# Patient Record
Sex: Male | Born: 1960
Health system: Southern US, Community
[De-identification: ages and names within clinical notes are randomized; demographics above are authoritative.]

## PROBLEM LIST (undated history)

## (undated) DIAGNOSIS — M1990 Unspecified osteoarthritis, unspecified site: Secondary | ICD-10-CM

## (undated) DIAGNOSIS — F32A Depression, unspecified: Secondary | ICD-10-CM

## (undated) DIAGNOSIS — M7550 Bursitis of unspecified shoulder: Secondary | ICD-10-CM

## (undated) DIAGNOSIS — M810 Age-related osteoporosis without current pathological fracture: Secondary | ICD-10-CM

## (undated) DIAGNOSIS — M779 Enthesopathy, unspecified: Secondary | ICD-10-CM

## (undated) DIAGNOSIS — C801 Malignant (primary) neoplasm, unspecified: Secondary | ICD-10-CM

## (undated) DIAGNOSIS — T7840XA Allergy, unspecified, initial encounter: Secondary | ICD-10-CM

## (undated) DIAGNOSIS — J45909 Unspecified asthma, uncomplicated: Secondary | ICD-10-CM

## (undated) DIAGNOSIS — Z9889 Other specified postprocedural states: Secondary | ICD-10-CM

## (undated) DIAGNOSIS — R112 Nausea with vomiting, unspecified: Secondary | ICD-10-CM

## (undated) DIAGNOSIS — I10 Essential (primary) hypertension: Secondary | ICD-10-CM

## (undated) DIAGNOSIS — M21969 Unspecified acquired deformity of unspecified lower leg: Secondary | ICD-10-CM

## (undated) DIAGNOSIS — F419 Anxiety disorder, unspecified: Secondary | ICD-10-CM

## (undated) DIAGNOSIS — R7303 Prediabetes: Secondary | ICD-10-CM

## (undated) DIAGNOSIS — E785 Hyperlipidemia, unspecified: Secondary | ICD-10-CM

## (undated) HISTORY — PX: ROTATOR CUFF REPAIR: SHX139

## (undated) HISTORY — DX: Bursitis of unspecified shoulder: M75.50

## (undated) HISTORY — DX: Age-related osteoporosis without current pathological fracture: M81.0

## (undated) HISTORY — DX: Hyperlipidemia, unspecified: E78.5

## (undated) HISTORY — DX: Allergy, unspecified, initial encounter: T78.40XA

## (undated) HISTORY — DX: Depression, unspecified: F32.A

## (undated) HISTORY — DX: Unspecified acquired deformity of unspecified lower leg: M21.969

## (undated) HISTORY — DX: Enthesopathy, unspecified: M77.9

## (undated) HISTORY — PX: WRIST SURGERY: SHX841

## (undated) HISTORY — DX: Essential (primary) hypertension: I10

## (undated) HISTORY — DX: Anxiety disorder, unspecified: F41.9

## (undated) HISTORY — PX: SHOULDER FUSION SURGERY: SHX775

## (undated) HISTORY — DX: Unspecified osteoarthritis, unspecified site: M19.90

---

## 1999-04-09 HISTORY — PX: FOOT SURGERY: SHX648

## 2003-03-30 ENCOUNTER — Other Ambulatory Visit: Payer: Self-pay

## 2007-04-09 HISTORY — PX: SHOULDER FUSION SURGERY: SHX775

## 2007-04-09 HISTORY — PX: ROTATOR CUFF REPAIR: SHX139

## 2008-02-27 ENCOUNTER — Ambulatory Visit: Payer: Self-pay | Admitting: Orthopedic Surgery

## 2008-03-28 ENCOUNTER — Ambulatory Visit: Payer: Self-pay | Admitting: Orthopedic Surgery

## 2008-04-04 ENCOUNTER — Ambulatory Visit: Payer: Self-pay | Admitting: Orthopedic Surgery

## 2012-02-13 DIAGNOSIS — I10 Essential (primary) hypertension: Secondary | ICD-10-CM | POA: Insufficient documentation

## 2012-02-13 DIAGNOSIS — J301 Allergic rhinitis due to pollen: Secondary | ICD-10-CM | POA: Insufficient documentation

## 2012-02-13 DIAGNOSIS — G8929 Other chronic pain: Secondary | ICD-10-CM | POA: Insufficient documentation

## 2012-02-13 DIAGNOSIS — E785 Hyperlipidemia, unspecified: Secondary | ICD-10-CM | POA: Insufficient documentation

## 2012-02-14 DIAGNOSIS — F1110 Opioid abuse, uncomplicated: Secondary | ICD-10-CM | POA: Insufficient documentation

## 2012-08-11 DIAGNOSIS — G4750 Parasomnia, unspecified: Secondary | ICD-10-CM | POA: Insufficient documentation

## 2012-08-11 DIAGNOSIS — E1169 Type 2 diabetes mellitus with other specified complication: Secondary | ICD-10-CM | POA: Insufficient documentation

## 2012-08-11 DIAGNOSIS — R7303 Prediabetes: Secondary | ICD-10-CM | POA: Insufficient documentation

## 2015-05-30 ENCOUNTER — Other Ambulatory Visit: Payer: Self-pay | Admitting: Family Medicine

## 2015-05-30 ENCOUNTER — Encounter: Payer: Self-pay | Admitting: Family Medicine

## 2015-05-30 ENCOUNTER — Ambulatory Visit (INDEPENDENT_AMBULATORY_CARE_PROVIDER_SITE_OTHER): Payer: Self-pay | Admitting: Family Medicine

## 2015-05-30 VITALS — BP 157/104 | HR 106 | Temp 98.4°F | Resp 16 | Ht 68.0 in | Wt 205.8 lb

## 2015-05-30 DIAGNOSIS — Z205 Contact with and (suspected) exposure to viral hepatitis: Secondary | ICD-10-CM

## 2015-05-30 DIAGNOSIS — F419 Anxiety disorder, unspecified: Secondary | ICD-10-CM

## 2015-05-30 DIAGNOSIS — E785 Hyperlipidemia, unspecified: Secondary | ICD-10-CM

## 2015-05-30 DIAGNOSIS — J0100 Acute maxillary sinusitis, unspecified: Secondary | ICD-10-CM

## 2015-05-30 DIAGNOSIS — I10 Essential (primary) hypertension: Secondary | ICD-10-CM

## 2015-05-30 MED ORDER — DM-GUAIFENESIN ER 30-600 MG PO TB12: 1.0000 | ORAL_TABLET | Freq: Two times a day (BID) | ORAL | Status: DC

## 2015-05-30 MED ORDER — FLUTICASONE PROPIONATE 50 MCG/ACT NA SUSP: 2.0000 | Freq: Every day | NASAL | Status: DC

## 2015-05-30 MED ORDER — PRAVASTATIN SODIUM 40 MG PO TABS: 40.0000 mg | ORAL_TABLET | Freq: Every day | ORAL | Status: DC

## 2015-05-30 MED ORDER — LISINOPRIL-HYDROCHLOROTHIAZIDE 20-12.5 MG PO TABS: 1.0000 | ORAL_TABLET | Freq: Every day | ORAL | Status: DC

## 2015-05-30 MED ORDER — OXYMETAZOLINE HCL 0.05 % NA SOLN: 2.0000 | Freq: Two times a day (BID) | NASAL | Status: DC

## 2015-05-30 MED ORDER — AMOXICILLIN-POT CLAVULANATE 875-125 MG PO TABS: 1.0000 | ORAL_TABLET | Freq: Two times a day (BID) | ORAL | Status: DC

## 2015-05-30 NOTE — Patient Instructions (Signed)
You can use supportive care at home to help with your symptoms. I have sent Mucinex DM to your pharmacy to help break up the congestion and soothe your cough. You can takes this twice daily.  I have also sent tesslon perles to your pharmacy to help with the cough- you can take these 3 times daily as needed. Honey is a natural cough suppressant- so add it to your tea in the morning.  If you have a humidifer, set that up in your bedroom at night.   Take augmentin twice daily for 7 days for sinus infection. Please avoid OTC decongestants as they can drive your blood pressure up.   Your goal blood pressure is 140/90. Work on low salt/sodium diet - goal <1.5gm (1,500mg ) per day. Eat a diet high in fruits/vegetables and whole grains.  Look into mediterranean and DASH diet. Goal activity is 168min/wk of moderate intensity exercise.  This can be split into 30 minute chunks.  If you are not at this level, you can start with smaller 10-15 min increments and slowly build up activity. Look at [email or web address] for more resources  Please seek immediate medical attention at ER or Urgent Care if you develop: Chest pain, pressure or tightness. Shortness of breath accompanied by nausea or diaphoresis Visual changes Numbness or tingling on one side of the body Facial droop Altered mental status Or any concerning symptoms.

## 2015-05-30 NOTE — Progress Notes (Signed)
Subjective:    Patient ID: Jorge Jordan, male    DOB: 12-17-1960, 55 y.o.   MRN: [id number]  HPI: Jorge Jordan is a 55 y.o. male presenting on 05/30/2015 for Establish Care   HPI  Pt presents to establish care today. Previous care provider was [facility].  It has been 1.5 Time; days - years:[id number]} since His last PCP visit. Records from previous provider will be requested and reviewed. Current medical problems include:  Pt is reporting sinus symptoms and would like a sick visit today- full health history will be reviewed at later visit.   Pt reports sinus symptoms started on Saturday night- sinus HA, pressure, and congestion. Pt wife had similar illness with vomiting. Pt reports emesis x 2 today. No nausea. Lots of sinus drainage. No fevers at home. Some chest congestion and tightness. No trouble breathing. Cough is non-productive of sputum Home treatment: Nasal decongestants and tylenol.   Hypertension: BP is elevated. Diagnosed 10 years ago. Lisinopril 20mg  for several years- doesn't feel the lisinopril is working- checks BP at home- 150/100. Has been out since last Wednesday.    Health maintenance:  Pt is requesting to be screened for Hep C today.    Past Medical History  Diagnosis Date  . Arthritis   . Shoulder bursitis     both   . Tendinitis     elbow  . Foot deformity     foot arthrodesis L foot  . Hypertension   . Hyperlipidemia   . Anxiety   . Allergy    Social History   Social History  . Marital Status: Married    Spouse Name: N/A  . Number of Children: N/A  . Years of Education: N/A   Occupational History  . Not on file.   Social History Main Topics  . Smoking status: Never Smoker   . Smokeless tobacco: Not on file  . Alcohol Use: Yes  . Drug Use: No  . Sexual Activity: Not on file   Other Topics Concern  . Not on file   Social History Narrative  . No narrative on file   Family History  Problem Relation Age of Onset  .  Hyperlipidemia Mother   . Hypertension Mother   . Diabetes Mother   . Depression Mother   . Hypertension Father   . Hyperlipidemia Father   . Cancer Father     skin cancer  . Hypertension Brother   . Diabetes Maternal Grandmother    No current outpatient prescriptions on file prior to visit.   No current facility-administered medications on file prior to visit.    Review of Systems  Constitutional: Positive for chills. Negative for fever.  HENT: Positive for congestion, postnasal drip, rhinorrhea and sinus pressure. Negative for sore throat and trouble swallowing.   Respiratory: Positive for cough and chest tightness. Negative for shortness of breath and wheezing.   Cardiovascular: Negative for chest pain, palpitations and leg swelling.  Gastrointestinal: Positive for nausea and vomiting. Negative for abdominal pain.  Endocrine: Negative.   Genitourinary: Negative.  Negative for dysuria, urgency, discharge, penile pain and testicular pain.  Musculoskeletal: Negative for back pain, joint swelling and arthralgias.  Skin: Negative.   Neurological: Negative for dizziness, weakness, numbness and headaches.  Psychiatric/Behavioral: Negative for sleep disturbance and dysphoric mood.   Per HPI unless specifically indicated above     Objective:    BP 157/104 mmHg  Pulse 106  Temp(Src) 98.4 F (36.9 C) (Oral)  Resp 16  Ht 5\' 8"  (1.727 m)  Wt 205 lb 12.8 oz (93.35 kg)  BMI 31.30 kg/m2  Wt Readings from Last 3 Encounters:  05/30/15 205 lb 12.8 oz (93.35 kg)    Physical Exam  Constitutional: He is oriented to person, place, and time. He appears well-developed and well-nourished. No distress.  HENT:  Head: Normocephalic and atraumatic.  Right Ear: Hearing and tympanic membrane normal.  Left Ear: Hearing normal. Tympanic membrane is scarred.  Nose: Mucosal edema and rhinorrhea present. Right sinus exhibits frontal sinus tenderness. Left sinus exhibits frontal sinus tenderness.    Mouth/Throat: Posterior oropharyngeal erythema present.  Neck: Neck supple. No thyromegaly present.  Cardiovascular: Normal rate, regular rhythm and normal heart sounds.  Exam reveals no gallop and no friction rub.   No murmur heard. Pulmonary/Chest: Effort normal and breath sounds normal. He has no wheezes.  Abdominal: Soft. Bowel sounds are normal. He exhibits no distension. There is no tenderness. There is no rebound.  Musculoskeletal: Normal range of motion. He exhibits no edema or tenderness.  Neurological: He is alert and oriented to person, place, and time. He has normal reflexes.  Skin: Skin is warm and dry. No rash noted. No erythema.  Psychiatric: He has a normal mood and affect. His behavior is normal. Thought content normal.   No results found for this or any previous visit.    Assessment & Plan:   Problem List Items Addressed This Visit      Cardiovascular and Mediastinum   BP (high blood pressure)    BP is elevated. Cautioned pt to avoid decongestants. Add HCTZ to BP regimen. Check CMET. Recheck in 3 weeks.       Relevant Medications   lisinopril-hydrochlorothiazide (PRINZIDE,ZESTORETIC) 20-12.5 MG tablet   pravastatin (PRAVACHOL) 40 MG tablet   Other Relevant Orders   Comprehensive Metabolic Panel (CMET)    Other Visit Diagnoses    Acute maxillary sinusitis, recurrence not specified    -  Primary    Treat for sinus infection due to exquiste tenderness. Augmentin BID. Encouraged supportive care at home. Return if not improving.     Relevant Medications    amoxicillin-clavulanate (AUGMENTIN) 875-125 MG tablet    dextromethorphan-guaiFENesin (MUCINEX DM) 30-600 MG 12hr tablet    oxymetazoline (AFRIN NASAL SPRAY) 0.05 % nasal spray    Hyperlipidemia        Refill pravastatin, check Lipids.     Relevant Medications    lisinopril-hydrochlorothiazide (PRINZIDE,ZESTORETIC) 20-12.5 MG tablet    pravastatin (PRAVACHOL) 40 MG tablet    Other Relevant Orders    Lipid  Profile    Anxiety        Check TSH and vitamin D for anxiety while labwork is being done.     Relevant Orders    TSH    VITAMIN D 25 Hydroxy (Vit-D Deficiency, Fractures)    Exposure to hepatitis C        Relevant Orders    Hepatitis c antibody (reflex)       Meds ordered this encounter  Medications  . DISCONTD: lisinopril (PRINIVIL,ZESTRIL) 20 MG tablet    Sig:   . DISCONTD: pravastatin (PRAVACHOL) 40 MG tablet    Sig:   . lisinopril-hydrochlorothiazide (PRINZIDE,ZESTORETIC) 20-12.5 MG tablet    Sig: Take 1 tablet by mouth daily.    Dispense:  30 tablet    Refill:  11    Order Specific Question:  Supervising Provider    Answer:  Arlis Porta [phone]  .  amoxicillin-clavulanate (AUGMENTIN) 875-125 MG tablet    Sig: Take 1 tablet by mouth 2 (two) times daily.    Dispense:  14 tablet    Refill:  0    Order Specific Question:  Supervising Provider    Answer:  Arlis Porta [phone]  . dextromethorphan-guaiFENesin (MUCINEX DM) 30-600 MG 12hr tablet    Sig: Take 1 tablet by mouth 2 (two) times daily.    Dispense:  20 tablet    Refill:  0    Order Specific Question:  Supervising Provider    Answer:  Arlis Porta [phone]  . oxymetazoline (AFRIN NASAL SPRAY) 0.05 % nasal spray    Sig: Place 2 sprays into both nostrils 2 (two) times daily. For 3 days and 3 days only.    Dispense:  30 mL    Refill:  0    Order Specific Question:  Supervising Provider    Answer:  Arlis Porta [phone]  . pravastatin (PRAVACHOL) 40 MG tablet    Sig: Take 1 tablet (40 mg total) by mouth daily.    Dispense:  30 tablet    Refill:  11    Order Specific Question:  Supervising Provider    Answer:  Arlis Porta [phone]  . DISCONTD: fluticasone (FLONASE) 50 MCG/ACT nasal spray    Sig: Place 2 sprays into both nostrils daily.    Dispense:  16 g    Refill:  11    Order Specific Question:  Supervising Provider    Answer:  Arlis Porta [phone]      Follow  up plan: Return in about 3 weeks (around 06/20/2015) for BP check. Marland Kitchen

## 2015-05-30 NOTE — Assessment & Plan Note (Signed)
BP is elevated. Cautioned pt to avoid decongestants. Add HCTZ to BP regimen. Check CMET. Recheck in 3 weeks.

## 2015-06-08 LAB — COMPREHENSIVE METABOLIC PANEL
ALT: 82 IU/L — ABNORMAL HIGH (ref 0–44)
AST: 55 IU/L — ABNORMAL HIGH (ref 0–40)
Albumin/Globulin Ratio: 1.9 (ref 1.1–2.5)
Albumin: 4.7 g/dL (ref 3.5–5.5)
Alkaline Phosphatase: 111 IU/L (ref 39–117)
BUN/Creatinine Ratio: 11 (ref 9–20)
BUN: 13 mg/dL (ref 6–24)
Bilirubin Total: 0.6 mg/dL (ref 0.0–1.2)
CO2: 23 mmol/L (ref 18–29)
Calcium: 9.8 mg/dL (ref 8.7–10.2)
Chloride: 98 mmol/L (ref 96–106)
Creatinine, Ser: 1.17 mg/dL (ref 0.76–1.27)
GFR calc Af Amer: 81 mL/min/{1.73_m2} (ref >59)
GFR calc non Af Amer: 70 mL/min/{1.73_m2} (ref >59)
Globulin, Total: 2.5 g/dL (ref 1.5–4.5)
Glucose: 125 mg/dL — ABNORMAL HIGH (ref 65–99)
Potassium: 4.5 mmol/L (ref 3.5–5.2)
Sodium: 140 mmol/L (ref 134–144)
Total Protein: 7.2 g/dL (ref 6.0–8.5)

## 2015-06-08 LAB — LIPID PANEL
Chol/HDL Ratio: 4.6 ratio units (ref 0.0–5.0)
Cholesterol, Total: 210 mg/dL — ABNORMAL HIGH (ref 100–199)
HDL: 46 mg/dL (ref >39)
LDL Calculated: 109 mg/dL — ABNORMAL HIGH (ref 0–99)
Triglycerides: 277 mg/dL — ABNORMAL HIGH (ref 0–149)
VLDL Cholesterol Cal: 55 mg/dL — ABNORMAL HIGH (ref 5–40)

## 2015-06-08 LAB — HCV COMMENT:

## 2015-06-08 LAB — TSH: TSH: 2.44 u[IU]/mL (ref 0.450–4.500)

## 2015-06-08 LAB — VITAMIN D 25 HYDROXY (VIT D DEFICIENCY, FRACTURES): Vit D, 25-Hydroxy: 14.7 ng/mL — ABNORMAL LOW (ref 30.0–100.0)

## 2015-06-08 LAB — HEPATITIS C ANTIBODY (REFLEX): HCV Ab: <0.1 s/co ratio (ref 0.0–0.9)

## 2015-06-09 ENCOUNTER — Other Ambulatory Visit: Payer: Self-pay | Admitting: Family Medicine

## 2015-06-09 DIAGNOSIS — E559 Vitamin D deficiency, unspecified: Secondary | ICD-10-CM

## 2015-06-09 MED ORDER — VITAMIN D (ERGOCALCIFEROL) 1.25 MG (50000 UNIT) PO CAPS: 50000.0000 [IU] | ORAL_CAPSULE | ORAL | Status: DC

## 2015-06-20 ENCOUNTER — Other Ambulatory Visit: Payer: Self-pay | Admitting: Family Medicine

## 2015-06-20 ENCOUNTER — Ambulatory Visit (INDEPENDENT_AMBULATORY_CARE_PROVIDER_SITE_OTHER): Payer: Self-pay | Admitting: Family Medicine

## 2015-06-20 VITALS — BP 147/92 | HR 97 | Temp 98.7°F | Resp 16 | Ht 68.0 in | Wt 206.0 lb

## 2015-06-20 DIAGNOSIS — I10 Essential (primary) hypertension: Secondary | ICD-10-CM

## 2015-06-20 DIAGNOSIS — R740 Nonspecific elevation of levels of transaminase and lactic acid dehydrogenase [LDH]: Secondary | ICD-10-CM

## 2015-06-20 DIAGNOSIS — F1010 Alcohol abuse, uncomplicated: Secondary | ICD-10-CM

## 2015-06-20 DIAGNOSIS — E7800 Pure hypercholesterolemia, unspecified: Secondary | ICD-10-CM

## 2015-06-20 DIAGNOSIS — Z125 Encounter for screening for malignant neoplasm of prostate: Secondary | ICD-10-CM

## 2015-06-20 DIAGNOSIS — F419 Anxiety disorder, unspecified: Secondary | ICD-10-CM

## 2015-06-20 DIAGNOSIS — R002 Palpitations: Secondary | ICD-10-CM

## 2015-06-20 DIAGNOSIS — R7401 Elevation of levels of liver transaminase levels: Secondary | ICD-10-CM

## 2015-06-20 DIAGNOSIS — F1011 Alcohol abuse, in remission: Secondary | ICD-10-CM | POA: Insufficient documentation

## 2015-06-20 MED ORDER — PAROXETINE HCL 10 MG PO TABS: 10.0000 mg | ORAL_TABLET | Freq: Every day | ORAL | Status: DC

## 2015-06-20 MED ORDER — LISINOPRIL-HYDROCHLOROTHIAZIDE 20-25 MG PO TABS: 1.0000 | ORAL_TABLET | Freq: Every day | ORAL | Status: DC

## 2015-06-20 NOTE — Assessment & Plan Note (Signed)
Hold statin at this time due to elevated liver enzymes after restart.

## 2015-06-20 NOTE — Patient Instructions (Addendum)
I recommend reducing the alcohol you drink each day. It had negatively impacted your liver. I would like you to recheck your liver enzymes in 2-3 weeks after reducing alcohol.  Anxiety: Let's try paxil to see if it helps your symptoms. Take 1/2 tablet for the first 4 days. Then resume full dose. Recommend taking it at night to help with side effects. Do not stop medication abruptly. Please call if you want to stop medication.   Blood pressure: Let's increase your Lisinopril HCTZ to 20-25 mg to help your blood pressure. We will check back in 1 month.  Your goal blood pressure is 140/90. Work on low salt/sodium diet - goal <1.5gm (1,500mg ) per day. Eat a diet high in fruits/vegetables and whole grains.  Look into mediterranean and DASH diet. Goal activity is 139min/wk of moderate intensity exercise.  This can be split into 30 minute chunks.  If you are not at this level, you can start with smaller 10-15 min increments and slowly build up activity. Look at [place].org for more resources

## 2015-06-20 NOTE — Assessment & Plan Note (Signed)
Increased lisinopril HCTZ to 20-25mg  once daily. Check BP at home. Encouraged cessation of alcohol use. Recheck BMET next visit.

## 2015-06-20 NOTE — Assessment & Plan Note (Addendum)
Pt is requesting clonazepam BID for his symptoms. Have discussed risks and benefit of long-term benzodiazapine use. Given current alcohol use- I am not comfortable giving him a clonazepam. Discussed having him see psychiatry to discuss his symptoms and for management of anxiety.   Pt has agreed to try Paxil for anxiety. Start 10mg  daily. Risks benefits reviewed, side effects reviewed. Recheck 1 mos or with psychiatry.

## 2015-06-20 NOTE — Progress Notes (Signed)
Subjective:    Patient ID: Jorge Jordan, male    DOB: 1961-01-18, 55 y.o.   MRN: [id number]  HPI: Jorge Jordan is a 55 y.o. male presenting on 06/20/2015 for Hypertension   HPI  Pt presents for blood pressure follow-up. Medications changed to Lisinopril-HCTZ for blood pressure control.  CHecks BP at home- avg 130-150/80-90. No chest pain or visual changes. No dizziness. Joined [employer] 1 month ago. Walks 4 miles/hour.  Hand numbness- has issues with carpal tunnel for main years. Gets paresthesia in the finger with hard grips.  Liver enzyme elevations- found on labwork. Had only taken statin for a few days. Is taking lots of tylenol and aleve for pain. Pt reports he is drinking shots (5) of vodka per day to help with anxiety.  Anxiety: Pt reports that his previous doctor had taken him off clonazepam in December of 2015. Was placed on Celexa- he did not like it. Made him feel bad. He stopped taking it. Previous medications include lexapro.  Has been self medicating with alcohol. Wakes every morning with panic symptoms. He reports with rapid HR every morning. Has experience rapid heart beat since 2012. Lasts about (20 minutes). Feels regular and fast. Had always attributed to panic symptoms.  No previous cardiac work-up.   Past Medical History  Diagnosis Date  . Arthritis   . Shoulder bursitis     both   . Tendinitis     elbow  . Foot deformity     foot arthrodesis L foot  . Hypertension   . Hyperlipidemia   . Anxiety   . Allergy     Current Outpatient Prescriptions on File Prior to Visit  Medication Sig  . oxymetazoline (AFRIN NASAL SPRAY) 0.05 % nasal spray Place 2 sprays into both nostrils 2 (two) times daily. For 3 days and 3 days only.  . pravastatin (PRAVACHOL) 40 MG tablet Take 1 tablet (40 mg total) by mouth daily.  . Vitamin D, Ergocalciferol, (DRISDOL) 50000 units CAPS capsule Take 1 capsule (50,000 Units total) by mouth every 7 (seven) days.   No current  facility-administered medications on file prior to visit.    Review of Systems  Constitutional: Negative for fever and chills.  HENT: Negative.   Respiratory: Negative for chest tightness, shortness of breath and wheezing.   Cardiovascular: Negative for chest pain, palpitations and leg swelling.  Gastrointestinal: Negative for nausea, vomiting and abdominal pain.  Endocrine: Negative.   Genitourinary: Negative for dysuria, urgency, discharge, penile pain and testicular pain.  Musculoskeletal: Positive for arthralgias (Shoulder pain. Needs rotator cuff repaired.). Negative for back pain and joint swelling.  Skin: Negative.   Neurological: Negative for dizziness, weakness, numbness and headaches.  Psychiatric/Behavioral: Negative for sleep disturbance and dysphoric mood. The patient is nervous/anxious.    Per HPI unless specifically indicated above     Objective:    BP 147/92 mmHg  Pulse 97  Temp(Src) 98.7 F (37.1 C) (Oral)  Resp 16  Ht 5\' 8"  (1.727 m)  Wt 206 lb (93.441 kg)  BMI 31.33 kg/m2  Wt Readings from Last 3 Encounters:  06/20/15 206 lb (93.441 kg)  05/30/15 205 lb 12.8 oz (93.35 kg)    Physical Exam  Constitutional: He is oriented to person, place, and time. He appears well-developed and well-nourished. No distress.  HENT:  Head: Normocephalic and atraumatic.  Neck: Neck supple. No thyromegaly present.  Cardiovascular: Normal rate, regular rhythm and normal heart sounds.  Exam reveals no gallop and  no friction rub.   No murmur heard. Pulmonary/Chest: Effort normal and breath sounds normal. He has no wheezes.  Abdominal: Soft. Bowel sounds are normal. He exhibits no distension. There is no tenderness. There is no rebound.  Musculoskeletal: He exhibits no edema.       Right shoulder: He exhibits decreased range of motion (due to pain. + painful arc) and tenderness. He exhibits no bony tenderness, no swelling and no effusion.  Neurological: He is alert and oriented to  person, place, and time. He has normal reflexes.  Skin: Skin is warm and dry. No rash noted. No erythema.  Psychiatric: His speech is normal and behavior is normal. Judgment and thought content normal. His mood appears anxious. Cognition and memory are normal.   Results for orders placed or performed in visit on 05/30/15  Comprehensive Metabolic Panel (CMET)  Result Value Ref Range   Glucose 125 (H) 65 - 99 mg/dL   BUN 13 6 - 24 mg/dL   Creatinine, Ser 1.17 0.76 - 1.27 mg/dL   GFR calc non Af Amer 70 >59 mL/min/1.73   GFR calc Af Amer 81 >59 mL/min/1.73   BUN/Creatinine Ratio 11 9 - 20   Sodium 140 134 - 144 mmol/L   Potassium 4.5 3.5 - 5.2 mmol/L   Chloride 98 96 - 106 mmol/L   CO2 23 18 - 29 mmol/L   Calcium 9.8 8.7 - 10.2 mg/dL   Total Protein 7.2 6.0 - 8.5 g/dL   Albumin 4.7 3.5 - 5.5 g/dL   Globulin, Total 2.5 1.5 - 4.5 g/dL   Albumin/Globulin Ratio 1.9 1.1 - 2.5   Bilirubin Total 0.6 0.0 - 1.2 mg/dL   Alkaline Phosphatase 111 39 - 117 IU/L   AST 55 (H) 0 - 40 IU/L   ALT 82 (H) 0 - 44 IU/L  Lipid Profile  Result Value Ref Range   Cholesterol, Total 210 (H) 100 - 199 mg/dL   Triglycerides 277 (H) 0 - 149 mg/dL   HDL 46 >39 mg/dL   VLDL Cholesterol Cal 55 (H) 5 - 40 mg/dL   LDL Calculated 109 (H) 0 - 99 mg/dL   Chol/HDL Ratio 4.6 0.0 - 5.0 ratio units  TSH  Result Value Ref Range   TSH 2.440 0.450 - 4.500 uIU/mL  VITAMIN D 25 Hydroxy (Vit-D Deficiency, Fractures)  Result Value Ref Range   Vit D, 25-Hydroxy 14.7 (L) 30.0 - 100.0 ng/mL  Hepatitis c antibody (reflex)  Result Value Ref Range   HCV Ab <0.1 0.0 - 0.9 s/co ratio  HCV Comment:  Result Value Ref Range   Comment: Comment   Hepatitis panel, acute  Result Value Ref Range   Hep A IgM Negative Negative   Hepatitis B Surface Ag Negative Negative   Hep B C IgM Negative Negative   Hep C Virus Ab <0.1 0.0 - 0.9 s/co ratio  Specimen status report  Result Value Ref Range   specimen status report Comment         Assessment & Plan:   Problem List Items Addressed This Visit      Cardiovascular and Mediastinum   BP (high blood pressure) - Primary    Increased lisinopril HCTZ to 20-25mg  once daily. Check BP at home. Encouraged cessation of alcohol use. Recheck BMET next visit.        Relevant Medications   lisinopril-hydrochlorothiazide (PRINZIDE,ZESTORETIC) 20-25 MG tablet     Other   Hypercholesterolemia    Hold statin at this time due  to elevated liver enzymes after restart.       Relevant Medications   lisinopril-hydrochlorothiazide (PRINZIDE,ZESTORETIC) 20-25 MG tablet   Anxiety    Pt is requesting clonazepam BID for his symptoms. Have discussed risks and benefit of long-term benzodiazapine use. Given current alcohol use- I am not comfortable giving him a clonazepam. Discussed having him see psychiatry to discuss his symptoms and for management of anxiety.   Pt has agreed to try Paxil for anxiety. Start 10mg  daily. Risks benefits reviewed, side effects reviewed. Recheck 1 mos or with psychiatry.       Relevant Medications   PARoxetine (PAXIL) 10 MG tablet   Other Relevant Orders   Ambulatory referral to Psychiatry   Alcohol abuse    5 shots per day or more. Reviewed safe alcohol use and risks of excessive alcohol intake. Due to elevated liver enzymes- have encouraged patient to attempt to abstain from alcohol. Pt has reported he uses alcohol to "self medicate"       Other Visit Diagnoses    Elevated transaminase level        Likely 2/2 combination of daily tylenol and excessive ETOH use. Hepatitis negative. Recheck 2-3 weeks. Consider liver US.     Relevant Orders    Hepatic function panel    Screening for prostate cancer        Relevant Orders    PSA    Palpitation        Relevant Orders    EKG 12-Lead       Meds ordered this encounter  Medications  . PARoxetine (PAXIL) 10 MG tablet    Sig: Take 1 tablet (10 mg total) by mouth daily.    Dispense:  30 tablet     Refill:  11    Order Specific Question:  Supervising Provider    Answer:  Arlis Porta [phone]  . lisinopril-hydrochlorothiazide (PRINZIDE,ZESTORETIC) 20-25 MG tablet    Sig: Take 1 tablet by mouth daily.    Dispense:  30 tablet    Refill:  11    Order Specific Question:  Supervising Provider    Answer:  Arlis Porta [phone]      Follow up plan: Return in about 4 weeks (around 07/18/2015) for BP.

## 2015-06-20 NOTE — Assessment & Plan Note (Signed)
5 shots per day or more. Reviewed safe alcohol use and risks of excessive alcohol intake. Due to elevated liver enzymes- have encouraged patient to attempt to abstain from alcohol. Pt has reported he uses alcohol to "self medicate"

## 2015-06-21 ENCOUNTER — Telehealth: Payer: Self-pay | Admitting: Family Medicine

## 2015-06-21 NOTE — Telephone Encounter (Signed)
Called [facility] and scheduled appt for Aug 09, 2015 @10 . Called patient to relay info. Patient declined. Told patient that he can try [facility] [phone]. If he doesn't have ins, just bring proof of income and photo id.

## 2015-06-21 NOTE — Telephone Encounter (Signed)
Lea at [facility] said they received a referral but after the dr reviewed pt's chart refused to see him due to alcohol abuse and benzo.  She suggested [facility].  Her call back is [phone]

## 2015-07-05 LAB — HEPATITIS PANEL, ACUTE
Hep A IgM: NEGATIVE
Hep B C IgM: NEGATIVE
Hep C Virus Ab: <0.1 s/co ratio (ref 0.0–0.9)
Hepatitis B Surface Ag: NEGATIVE

## 2015-07-05 LAB — SPECIMEN STATUS REPORT

## 2015-07-19 LAB — PSA: Prostate Specific Ag, Serum: 1 ng/mL (ref 0.0–4.0)

## 2015-07-28 ENCOUNTER — Ambulatory Visit (INDEPENDENT_AMBULATORY_CARE_PROVIDER_SITE_OTHER): Payer: Self-pay | Admitting: Family Medicine

## 2015-07-28 ENCOUNTER — Encounter: Payer: Self-pay | Admitting: Family Medicine

## 2015-07-28 VITALS — BP 154/98 | HR 99 | Temp 98.2°F | Resp 16 | Ht 68.0 in | Wt 208.0 lb

## 2015-07-28 DIAGNOSIS — F339 Major depressive disorder, recurrent, unspecified: Secondary | ICD-10-CM | POA: Insufficient documentation

## 2015-07-28 DIAGNOSIS — F3181 Bipolar II disorder: Secondary | ICD-10-CM

## 2015-07-28 DIAGNOSIS — I10 Essential (primary) hypertension: Secondary | ICD-10-CM

## 2015-07-28 DIAGNOSIS — M19019 Primary osteoarthritis, unspecified shoulder: Secondary | ICD-10-CM

## 2015-07-28 DIAGNOSIS — Z0189 Encounter for other specified special examinations: Secondary | ICD-10-CM

## 2015-07-28 DIAGNOSIS — Z981 Arthrodesis status: Secondary | ICD-10-CM

## 2015-07-28 DIAGNOSIS — M129 Arthropathy, unspecified: Secondary | ICD-10-CM

## 2015-07-28 NOTE — Patient Instructions (Signed)
Please come in 4 weeks for a blood pressure check.

## 2015-07-28 NOTE — Assessment & Plan Note (Signed)
Recent diagnosis by psychiatry. Is currently seeking treatment at [facility].

## 2015-07-28 NOTE — Assessment & Plan Note (Signed)
Moderate limitations with lifting and carrying due to shoulder arthritis and bursitis. Not currently followed by orthopedics. Unable to perform heavy lifting due to these issues.

## 2015-07-28 NOTE — Assessment & Plan Note (Signed)
Disability determination- mild limitations with sitting, standing, and walking during exam. Limitations on bending and lifting- pt is unable to perform these on a sustained and regular basis. Pt will not be able to lift heavy objects due to shoulder limitations. Pt will be able to grasp, reach, and handle objects on a occasional basis.

## 2015-07-28 NOTE — Progress Notes (Signed)
Subjective:    Patient ID: Jorge Jordan, male    DOB: [birth year], 55 y.o.   MRN: [id number]  HPI: Jorge Jordan is a 55 y.o. male presenting on 07/28/2015 for Annual Exam   HPI  Pt presents for disability determination visit. Current medical problems include hypertension, arthritis, severe bursisits, vitamin D deficiency and bipolar 2 disorder (recent diagnosis). Applying for disability based on his arthritis and hypertension. 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   Arthritis: Diagnosed about 15 years ago due to wear and tear on his joints.  ReportsTore his R rotator cuff on the job in 2015.  Currently not seeing an orthopedist due to no insurance.MRI's and XR were done by a previous provider.. L shoulder rotator cuff repair done in 2009 by [facility]- XR and MRI showed arthritis/bursitis. 01/2014 had symptoms consistent with rotator cuff injury- XR at [facility] ortho but no MRI. Was told to have PT but did not due to cost.  Pain level in shoulders is reported to be 8/10.  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 Pt has history of arthrodesis and repair in L foot at [place].This affects his ability to work due to aching and lack of lateral movement in the L foot. Does not ambulate with an assistive device.  Pt also hyperhydrosis in his feet. Has sweating in feet. Cannot wear work boots due to blistering of the feet. Wears white socks. Gets big water filled blisters on heels and arches. Does not happen when he doesn't wear socks or If he doesn't keep them on long it will not happen.   Hypertension- never been hospitalized for it.  Has been elevated on every previous visit. Affected his work performance due  to frequent HA when hypertension was uncontrolled. Pt states BP is up today due to stress. Is taking his medications regularly. Denies chest pain, shortness of breath or visual changes.   Bipolar 2 Disorder: Previously reported anxiety and was sent to psychiatry by this office due to inability to control symptoms. Started seeing psychiatry at [facility] recent diagnosis of  Bipolar 2 disorder. Will see Dr. Jacqualine Code next week for medication management. Reports some issues with his memory. Frequently forgetful. Pt reports his anxiety prevents him from leaving the house on some days. It also impairs his ability to concentrate. Will avoid going to the store because he does not want to be in public. His mental health issues previously affected his job performance because "pt states is sensitive to stress" and worked in a stressful environment.   Past Medical History  Diagnosis Date  . Arthritis   . Shoulder bursitis     both   . Tendinitis     elbow  . Foot deformity     foot arthrodesis L foot  . Hypertension   . Hyperlipidemia   . Anxiety   . Allergy     Current Outpatient Prescriptions on File Prior to Visit  Medication Sig  . lisinopril-hydrochlorothiazide (PRINZIDE,ZESTORETIC) 20-25 MG tablet Take 1 tablet by mouth daily.  . Vitamin D, Ergocalciferol, (DRISDOL) 50000 units CAPS capsule Take 1 capsule (50,000 Units total) by mouth every 7 (seven) days.  Marland Kitchen oxymetazoline (AFRIN NASAL SPRAY) 0.05 %  nasal spray Place 2 sprays into both nostrils 2 (two) times daily. For 3 days and 3 days only. (Patient not taking: Reported on 07/28/2015)   No current facility-administered medications on file prior to visit.    Review of Systems  Constitutional: Negative for fever and chills.  HENT: Negative.   Eyes: Negative for visual disturbance.  Respiratory: Negative for chest tightness, shortness of breath and wheezing.   Cardiovascular: Negative for chest pain, palpitations and leg swelling.    Gastrointestinal: Negative for nausea, vomiting and abdominal pain.  Endocrine: Negative.  Negative for polydipsia, polyphagia and polyuria.  Genitourinary: Negative for dysuria, urgency, discharge, penile pain and testicular pain.  Musculoskeletal: Positive for arthralgias. Negative for back pain and joint swelling.  Skin: Positive for rash.  Allergic/Immunologic: Negative for environmental allergies.  Neurological: Negative for dizziness, weakness, numbness and headaches.  Psychiatric/Behavioral: Positive for decreased concentration. Negative for suicidal ideas, sleep disturbance and dysphoric mood. The patient is nervous/anxious.    Per HPI unless specifically indicated above     Objective:    BP 154/98 mmHg  Pulse 99  Temp(Src) 98.2 F (36.8 C) (Oral)  Resp 16  Ht 5\' 8"  (1.727 m)  Wt 208 lb (94.348 kg)  BMI 31.63 kg/m2  Wt Readings from Last 3 Encounters:  07/28/15 208 lb (94.348 kg)  06/20/15 206 lb (93.441 kg)  05/30/15 205 lb 12.8 oz (93.35 kg)    Physical Exam  Constitutional: He is oriented to person, place, and time. He appears well-developed and well-nourished. No distress.  HENT:  Head: Normocephalic and atraumatic.  Neck: Neck supple. No thyromegaly present.  Cardiovascular: Normal rate, regular rhythm and normal heart sounds.  Exam reveals no gallop and no friction rub.   No murmur heard. Pulmonary/Chest: Effort normal and breath sounds normal. He has no wheezes.  Abdominal: Soft. Bowel sounds are normal. He exhibits no distension. There is no tenderness. There is no rebound.  Musculoskeletal: He exhibits no edema.       Right shoulder: He exhibits tenderness and decreased strength (4/5). He exhibits no swelling, no effusion, no deformity, no spasm and normal pulse.       Left shoulder: He exhibits tenderness, effusion and decreased strength (4/5). He exhibits no bony tenderness, no swelling and no spasm.       Right elbow: Normal.      Left elbow: Normal.        Right wrist: He exhibits decreased range of motion (decrease dorsilfexion and ulnar deviation.).       Left wrist: He exhibits decreased range of motion (Decreased dorsiflexion and ulnar deviation). He exhibits no tenderness, no bony tenderness and no swelling.       Right hip: He exhibits decreased range of motion (decreased adduction/abduction of the hip. Pain with internal rotation. ). He exhibits normal strength and no tenderness.       Left hip: He exhibits decreased range of motion (decreased adduction/abduction. Pain with internal rotation of the hip. ). He exhibits no bony tenderness, no swelling, no crepitus and no deformity.       Left ankle: He exhibits decreased range of motion (no eversion or inverion of the L ankle.). He exhibits no swelling, no deformity, no laceration and normal pulse.       Lumbar back: Normal. He exhibits normal range of motion and no tenderness.       Left foot: There is decreased range of motion (unable to perform eversion or inversion. Liminted dorsiflexion. ). There is  no tenderness, no bony tenderness, normal capillary refill, no deformity and no laceration.  Straight leg raise negative bilateral extremities.  Gait steady but limping.   Able to lift and carry light objects on exam.  Able to rise from sitting without difficulty, no difficulty getting onto exam table.   Neurological: He is alert and oriented to person, place, and time. He has normal strength and normal reflexes. No cranial nerve deficit or sensory deficit. He displays a negative Romberg sign. Gait (ambulates with limp.) abnormal. GCS eye subscore is 4. GCS verbal subscore is 5. GCS motor subscore is 6.  Finger to nose intact. Heel to shin abnormal.   Skin: Skin is warm and dry. No rash noted. No erythema.  Psychiatric: His speech is normal and behavior is normal. Judgment and thought content normal. His mood appears anxious. Cognition and memory are normal.   Results for orders placed or  performed in visit on 06/20/15  PSA  Result Value Ref Range   Prostate Specific Ag, Serum 1.0 0.0 - 4.0 ng/mL      Assessment & Plan:   Problem List Items Addressed This Visit      Cardiovascular and Mediastinum   BP (high blood pressure) - Primary    Elevated today. Pt will need follow-up appt in 1 mos. For BP adjustment.         Other   Encounter for other specified special examinations    Disability determination- mild limitations with sitting, standing, and walking during exam. Limitations on bending and lifting- pt is unable to perform these on a sustained and regular basis. Pt will not be able to lift heavy objects due to shoulder limitations. Pt will be able to grasp, reach, and handle objects on a occasional basis.       Bipolar 2 disorder ([place])    Recent diagnosis by psychiatry. Is currently seeking treatment at [facility].       Arthritis of shoulder    Moderate limitations with lifting and carrying due to shoulder arthritis and bursitis. Not currently followed by orthopedics. Unable to perform heavy lifting due to these issues.       S/P ankle arthrodesis    Significant limitations on foot movement. Difficulty bending and crouching. Making it difficult to perform job duties.          No orders of the defined types were placed in this encounter.      Follow up plan: Return in about 4 weeks (around 08/25/2015) for BP check .

## 2015-07-28 NOTE — Assessment & Plan Note (Signed)
Elevated today. Pt will need follow-up appt in 1 mos. For BP adjustment.

## 2015-07-28 NOTE — Assessment & Plan Note (Signed)
Significant limitations on foot movement. Difficulty bending and crouching. Making it difficult to perform job duties.

## 2015-08-09 ENCOUNTER — Ambulatory Visit (INDEPENDENT_AMBULATORY_CARE_PROVIDER_SITE_OTHER): Payer: Self-pay | Admitting: Family Medicine

## 2015-08-09 ENCOUNTER — Encounter: Payer: Self-pay | Admitting: Family Medicine

## 2015-08-09 ENCOUNTER — Ambulatory Visit (HOSPITAL_COMMUNITY): Payer: Self-pay | Admitting: Psychiatry

## 2015-08-09 VITALS — BP 143/86 | HR 96 | Temp 99.0°F | Resp 16 | Ht 68.0 in | Wt 205.0 lb

## 2015-08-09 DIAGNOSIS — R7401 Elevation of levels of liver transaminase levels: Secondary | ICD-10-CM | POA: Insufficient documentation

## 2015-08-09 DIAGNOSIS — I10 Essential (primary) hypertension: Secondary | ICD-10-CM

## 2015-08-09 DIAGNOSIS — R740 Nonspecific elevation of levels of transaminase and lactic acid dehydrogenase [LDH]: Secondary | ICD-10-CM

## 2015-08-09 MED ORDER — AMLODIPINE BESYLATE 5 MG PO TABS: 5.0000 mg | ORAL_TABLET | Freq: Every day | ORAL | Status: DC

## 2015-08-09 NOTE — Progress Notes (Signed)
Subjective:    Patient ID: Jorge Jordan, male    DOB: 11-11-1960, 55 y.o.   MRN: [id number]  HPI: Jorge Jordan is a 55 y.o. male presenting on 08/09/2015 for Hypertension   Hypertension This is a chronic problem. The current episode started more than 1 year ago. Pertinent negatives include no chest pain, headaches, malaise/fatigue, neck pain, palpitations, peripheral edema, shortness of breath or sweats. Risk factors for coronary artery disease include male gender, sedentary lifestyle and dyslipidemia. Past treatments include ACE inhibitors and diuretics. The current treatment provides mild improvement.    Pt presents for HTN follow-up. BP has been elevated the past few visits. Elevated when checking at home- avg 140-150/90. Currently taking 20/25 of lisinopril HCTZ. No chest pain, visual changes, or SOB.  Elevated liver enzymes- needs recheck. No longer using tylenol as often.   Past Medical History  Diagnosis Date  . Arthritis   . Shoulder bursitis     both   . Tendinitis     elbow  . Foot deformity     foot arthrodesis L foot  . Hypertension   . Hyperlipidemia   . Anxiety   . Allergy     Current Outpatient Prescriptions on File Prior to Visit  Medication Sig  . lisinopril-hydrochlorothiazide (PRINZIDE,ZESTORETIC) 20-25 MG tablet Take 1 tablet by mouth daily.  Marland Kitchen oxymetazoline (AFRIN NASAL SPRAY) 0.05 % nasal spray Place 2 sprays into both nostrils 2 (two) times daily. For 3 days and 3 days only.  . Vitamin D, Ergocalciferol, (DRISDOL) 50000 units CAPS capsule Take 1 capsule (50,000 Units total) by mouth every 7 (seven) days.   No current facility-administered medications on file prior to visit.    Review of Systems  Constitutional: Negative for fever, chills and malaise/fatigue.  HENT: Negative.   Respiratory: Negative for chest tightness, shortness of breath and wheezing.   Cardiovascular: Negative for chest pain, palpitations and leg swelling.  Gastrointestinal:  Negative for nausea, vomiting and abdominal pain.  Endocrine: Negative.   Genitourinary: Negative for dysuria, urgency, discharge, penile pain and testicular pain.  Musculoskeletal: Negative for back pain, joint swelling, arthralgias and neck pain.  Skin: Negative.   Neurological: Negative for dizziness, weakness, numbness and headaches.  Psychiatric/Behavioral: Negative for sleep disturbance and dysphoric mood.   Per HPI unless specifically indicated above     Objective:    BP 143/86 mmHg  Pulse 96  Temp(Src) 99 F (37.2 C) (Oral)  Resp 16  Ht 5\' 8"  (1.727 m)  Wt 205 lb (92.987 kg)  BMI 31.18 kg/m2  Wt Readings from Last 3 Encounters:  08/09/15 205 lb (92.987 kg)  07/28/15 208 lb (94.348 kg)  06/20/15 206 lb (93.441 kg)    Physical Exam  Constitutional: He is oriented to person, place, and time. He appears well-developed and well-nourished. No distress.  HENT:  Head: Normocephalic and atraumatic.  Neck: Neck supple. No thyromegaly present.  Cardiovascular: Normal rate, regular rhythm and normal heart sounds.  Exam reveals no gallop and no friction rub.   No murmur heard. Pulmonary/Chest: Effort normal and breath sounds normal. He has no wheezes.  Abdominal: Soft. Bowel sounds are normal. He exhibits no distension. There is no tenderness. There is no rebound.  Musculoskeletal: Normal range of motion. He exhibits no edema or tenderness.  Neurological: He is alert and oriented to person, place, and time. He has normal reflexes.  Skin: Skin is warm and dry. No rash noted. No erythema.  Psychiatric: He has a normal  mood and affect. His behavior is normal. Thought content normal.   Results for orders placed or performed in visit on 06/20/15  PSA  Result Value Ref Range   Prostate Specific Ag, Serum 1.0 0.0 - 4.0 ng/mL      Assessment & Plan:   Problem List Items Addressed This Visit      Cardiovascular and Mediastinum   BP (high blood pressure) - Primary    Add  amlodipine 5mg  once daily for better BP control. Pt encouraged to check at home daily. Encouraged DASH diet. Recheck CMET to K levels. Recheck 6-8 weeks.       Relevant Medications   amLODipine (NORVASC) 5 MG tablet   Other Relevant Orders   Comprehensive metabolic panel     Other   Elevated transaminase level    Recheck hepatic function panel- pt was misusing tylenol and ibuprofen with last elevation. Ideally has improved. Consider liver US to eval for fatty liver disease if still elevated.          Meds ordered this encounter  Medications  . amLODipine (NORVASC) 5 MG tablet    Sig: Take 1 tablet (5 mg total) by mouth daily.    Dispense:  90 tablet    Refill:  3    Order Specific Question:  Supervising Provider    Answer:  Arlis Porta [id number]      Follow up plan: Return in about 8 weeks (around 10/04/2015) for BP check. Marland Kitchen

## 2015-08-09 NOTE — Assessment & Plan Note (Signed)
Recheck hepatic function panel- pt was misusing tylenol and ibuprofen with last elevation. Ideally has improved. Consider liver US to eval for fatty liver disease if still elevated.

## 2015-08-09 NOTE — Patient Instructions (Signed)
Your goal blood pressure is  100/60- 140/90 Work on low salt/sodium diet - goal <1.5gm (1,500mg ) per day. Eat a diet high in fruits/vegetables and whole grains.  Look into mediterranean and DASH diet. Goal activity is 153min/wk of moderate intensity exercise.  This can be split into 30 minute chunks.  If you are not at this level, you can start with smaller 10-15 min increments and slowly build up activity. Look at [place].[email or web address] for more resources  Please seek immediate medical attention at ER or Urgent Care if you develop: Chest pain, pressure or tightness. Shortness of breath accompanied by nausea or diaphoresis Visual changes Numbness or tingling on one side of the body Facial droop Altered mental status Or any concerning symptoms.

## 2015-08-09 NOTE — Assessment & Plan Note (Signed)
Add amlodipine 5mg  once daily for better BP control. Pt encouraged to check at home daily. Encouraged DASH diet. Recheck CMET to K levels. Recheck 6-8 weeks.

## 2015-08-24 LAB — COMPREHENSIVE METABOLIC PANEL
ALT: 60 IU/L — ABNORMAL HIGH (ref 0–44)
AST: 39 IU/L (ref 0–40)
Albumin/Globulin Ratio: 1.6 (ref 1.2–2.2)
Albumin: 4.4 g/dL (ref 3.5–5.5)
Alkaline Phosphatase: 95 IU/L (ref 39–117)
BUN/Creatinine Ratio: 14 (ref 9–20)
BUN: 17 mg/dL (ref 6–24)
Bilirubin Total: 0.7 mg/dL (ref 0.0–1.2)
CO2: 23 mmol/L (ref 18–29)
Calcium: 9.6 mg/dL (ref 8.7–10.2)
Chloride: 96 mmol/L (ref 96–106)
Creatinine, Ser: 1.24 mg/dL (ref 0.76–1.27)
GFR calc Af Amer: 76 mL/min/{1.73_m2} (ref >59)
GFR calc non Af Amer: 65 mL/min/{1.73_m2} (ref >59)
Globulin, Total: 2.8 g/dL (ref 1.5–4.5)
Glucose: 137 mg/dL — ABNORMAL HIGH (ref 65–99)
Potassium: 4.3 mmol/L (ref 3.5–5.2)
Sodium: 137 mmol/L (ref 134–144)
Total Protein: 7.2 g/dL (ref 6.0–8.5)

## 2015-08-25 ENCOUNTER — Ambulatory Visit: Payer: Self-pay | Admitting: Family Medicine

## 2015-10-02 ENCOUNTER — Encounter: Payer: Self-pay | Admitting: Podiatry

## 2015-10-02 ENCOUNTER — Ambulatory Visit (INDEPENDENT_AMBULATORY_CARE_PROVIDER_SITE_OTHER): Payer: Self-pay

## 2015-10-02 ENCOUNTER — Telehealth: Payer: Self-pay | Admitting: *Deleted

## 2015-10-02 ENCOUNTER — Encounter (INDEPENDENT_AMBULATORY_CARE_PROVIDER_SITE_OTHER): Payer: Self-pay

## 2015-10-02 ENCOUNTER — Encounter: Payer: Self-pay | Admitting: *Deleted

## 2015-10-02 ENCOUNTER — Ambulatory Visit (INDEPENDENT_AMBULATORY_CARE_PROVIDER_SITE_OTHER): Payer: Self-pay | Admitting: Podiatry

## 2015-10-02 VITALS — BP 136/86 | HR 69 | Resp 12

## 2015-10-02 DIAGNOSIS — M129 Arthropathy, unspecified: Secondary | ICD-10-CM

## 2015-10-02 DIAGNOSIS — M19079 Primary osteoarthritis, unspecified ankle and foot: Secondary | ICD-10-CM

## 2015-10-02 DIAGNOSIS — M79672 Pain in left foot: Secondary | ICD-10-CM

## 2015-10-02 NOTE — Telephone Encounter (Addendum)
Pt left name, DOB and phone number.  I left message requesting pt call again with a message so I could triage for urgency and possibly have an answer with the callback. Pt returned my call states he would like a copy of the letter Dr. Milinda Pointer is writing for his files, and he would like give [place] permission to release his files to Earley Brooke, NP at [facility].  I told him I would let Dr. Milinda Pointer know his request.

## 2015-10-02 NOTE — Progress Notes (Signed)
   Subjective:    Patient ID: Jorge Jordan, male    DOB: 1960/12/25, 55 y.o.   MRN: [id number]  HPI: [name] presents today after 17 years with a chief complaint of pain to the left foot. He states the pain is chronic and has worsened since he has been unable to find a job that allows him to not walk on concrete. He has had a triple arthrodesis left foot from previous congenital abnormalities that resulted in osteoarthritic change to the left foot. Currently he is having pain not and a foot but in the midfoot. He is currently seeking disability for not only his foot is back in his shoulder.    Review of Systems  Eyes: Positive for itching.  Musculoskeletal: Positive for joint swelling.  Hematological: Bruises/bleeds easily.       Objective:   Physical Exam: Vital signs are stable alert and oriented 3 pulses are strongly palpable. Neurologic sensory is intact. Deep tendon reflexes are intact. Muscle strength +5 over 5 dorsiflexion plantar flexors and inverters everters all into the musculature is intact. Orthopedic evaluation straight solid joints distal ankle for range of motion and crepitation. With exception of the rear foot left which does demonstrate pes planus. He has discomfort on range of motion of the forefoot frontal plane range of motion. No open lesions or wounds. Radiographs taken today do demonstrate complete triple arthrodesis which looks very good and the alignment is perfect however he does have typical sequelae which is associated with proximal fusion as he has started to develop breakdown of the joints distal to the surgical site.        Assessment & Plan:  Severe osteoarthritis of the left foot status post triple arthrodesis left.  Plan: I recommended that he continue anti-inflammatories as needed and that he follow up with [place] as needed.

## 2015-10-09 ENCOUNTER — Ambulatory Visit (INDEPENDENT_AMBULATORY_CARE_PROVIDER_SITE_OTHER): Payer: Self-pay | Admitting: Family Medicine

## 2015-10-09 VITALS — BP 126/84 | HR 103 | Temp 98.6°F | Resp 16 | Ht 68.0 in | Wt 206.0 lb

## 2015-10-09 DIAGNOSIS — R7401 Elevation of levels of liver transaminase levels: Secondary | ICD-10-CM

## 2015-10-09 DIAGNOSIS — I10 Essential (primary) hypertension: Secondary | ICD-10-CM

## 2015-10-09 DIAGNOSIS — R740 Nonspecific elevation of levels of transaminase and lactic acid dehydrogenase [LDH]: Secondary | ICD-10-CM

## 2015-10-09 DIAGNOSIS — E7800 Pure hypercholesterolemia, unspecified: Secondary | ICD-10-CM

## 2015-10-09 NOTE — Patient Instructions (Addendum)
Hyper hydrosis- Call [facility]:  [phone] [place]: Phone: [phone]  Your goal blood pressure is 140/90. Work on low salt/sodium diet - goal <1.5gm (1,500mg ) per day. Eat a diet high in fruits/vegetables and whole grains.  Look into mediterranean and DASH diet. Goal activity is 143min/wk of moderate intensity exercise.  This can be split into 30 minute chunks.  If you are not at this level, you can start with smaller 10-15 min increments and slowly build up activity. Look at [place].org for more resources  Please seek immediate medical attention at ER or Urgent Care if you develop: Chest pain, pressure or tightness. Shortness of breath accompanied by nausea or diaphoresis Visual changes Numbness or tingling on one side of the body Facial droop Altered mental status Or any concerning symptoms.

## 2015-10-09 NOTE — Assessment & Plan Note (Signed)
Controlled. Will continue current regimen. Recheck in 3 mos.

## 2015-10-09 NOTE — Assessment & Plan Note (Signed)
Restarted statin. Will recheck liver enzymes. Likely 2/2 ETOH use. Ideally down now that he doesn't drink. Will plan to monitor closely.

## 2015-10-09 NOTE — Progress Notes (Signed)
Subjective:    Patient ID: Jorge Jordan, male    DOB: 1960/09/13, 55 y.o.   MRN: [id number]  HPI: Jorge Jordan is a 55 y.o. male presenting on 10/09/2015 for Hypertension   HPI  Pt presents for follow-up of blood pressure. Started amlodipine. BP's at home avg 120/80.  No HA, no dizziness, no CP. Mild ankle swelling. Doing well at home.  Restarted the pravastatin. Had some mild liver enzymes elevation. Has stopped drinking since starting psychiatric treatment.  Bipolar: [employer] [place] seeing therapist and psychiatric nurse practitioner. Doing well on Latuda.    Past Medical History  Diagnosis Date  . Arthritis   . Shoulder bursitis     both   . Tendinitis     elbow  . Foot deformity     foot arthrodesis L foot  . Hypertension   . Hyperlipidemia   . Anxiety   . Allergy     Current Outpatient Prescriptions on File Prior to Visit  Medication Sig  . amLODipine (NORVASC) 5 MG tablet Take 1 tablet (5 mg total) by mouth daily.  Marland Kitchen lisinopril-hydrochlorothiazide (PRINZIDE,ZESTORETIC) 20-25 MG tablet Take 1 tablet by mouth daily.  Marland Kitchen oxymetazoline (AFRIN NASAL SPRAY) 0.05 % nasal spray Place 2 sprays into both nostrils 2 (two) times daily. For 3 days and 3 days only.  Marland Kitchen PARoxetine (PAXIL) 10 MG tablet   . Vitamin D, Ergocalciferol, (DRISDOL) 50000 units CAPS capsule Take 1 capsule (50,000 Units total) by mouth every 7 (seven) days.   No current facility-administered medications on file prior to visit.    Review of Systems  Constitutional: Negative for fever and chills.  HENT: Negative.   Respiratory: Negative for chest tightness, shortness of breath and wheezing.   Cardiovascular: Negative for chest pain, palpitations and leg swelling.  Gastrointestinal: Negative for nausea, vomiting and abdominal pain.  Endocrine: Negative.   Genitourinary: Negative for dysuria, urgency, discharge, penile pain and testicular pain.  Musculoskeletal: Negative for back pain, joint swelling and  arthralgias.  Skin: Negative.   Neurological: Negative for dizziness, weakness, numbness and headaches.  Psychiatric/Behavioral: Negative for sleep disturbance and dysphoric mood.   Per HPI unless specifically indicated above     Objective:    BP 126/84 mmHg  Pulse 103  Temp(Src) 98.6 F (37 C) (Oral)  Resp 16  Ht 5\' 8"  (1.727 m)  Wt 206 lb (93.441 kg)  BMI 31.33 kg/m2  Wt Readings from Last 3 Encounters:  10/09/15 206 lb (93.441 kg)  08/09/15 205 lb (92.987 kg)  07/28/15 208 lb (94.348 kg)    Physical Exam  Constitutional: He is oriented to person, place, and time. He appears well-developed and well-nourished. No distress.  HENT:  Head: Normocephalic and atraumatic.  Neck: Neck supple. No thyromegaly present.  Cardiovascular: Normal rate, regular rhythm and normal heart sounds.  Exam reveals no gallop and no friction rub.   No murmur heard. Pulmonary/Chest: Effort normal and breath sounds normal. He has no wheezes.  Abdominal: Soft. Bowel sounds are normal. He exhibits no distension. There is no tenderness. There is no rebound.  Musculoskeletal: Normal range of motion. He exhibits no edema or tenderness.  Neurological: He is alert and oriented to person, place, and time. He has normal reflexes.  Skin: Skin is warm and dry. No rash noted. No erythema.  Psychiatric: He has a normal mood and affect. His behavior is normal. Thought content normal.   Results for orders placed or performed in visit on 08/09/15  Comprehensive metabolic panel  Result Value Ref Range   Glucose 137 (H) 65 - 99 mg/dL   BUN 17 6 - 24 mg/dL   Creatinine, Ser 1.24 0.76 - 1.27 mg/dL   GFR calc non Af Amer 65 >59 mL/min/1.73   GFR calc Af Amer 76 >59 mL/min/1.73   BUN/Creatinine Ratio 14 9 - 20   Sodium 137 134 - 144 mmol/L   Potassium 4.3 3.5 - 5.2 mmol/L   Chloride 96 96 - 106 mmol/L   CO2 23 18 - 29 mmol/L   Calcium 9.6 8.7 - 10.2 mg/dL   Total Protein 7.2 6.0 - 8.5 g/dL   Albumin 4.4 3.5 -  5.5 g/dL   Globulin, Total 2.8 1.5 - 4.5 g/dL   Albumin/Globulin Ratio 1.6 1.2 - 2.2   Bilirubin Total 0.7 0.0 - 1.2 mg/dL   Alkaline Phosphatase 95 39 - 117 IU/L   AST 39 0 - 40 IU/L   ALT 60 (H) 0 - 44 IU/L      Assessment & Plan:   Problem List Items Addressed This Visit      Cardiovascular and Mediastinum   BP (high blood pressure)    Controlled. Will continue current regimen. Recheck in 3 mos.         Other   Hypercholesterolemia    Restarted lovastatin once daily.       Elevated transaminase level - Primary    Restarted statin. Will recheck liver enzymes. Likely 2/2 ETOH use. Ideally down now that he doesn't drink. Will plan to monitor closely.       Relevant Orders   Hepatic function panel      Meds ordered this encounter  Medications  . LORazepam (ATIVAN) 1 MG tablet    Sig: TK 1 T PO QHS    Refill:  1  . methylPREDNISolone (MEDROL DOSEPAK) 4 MG TBPK tablet    Sig: TK UTD    Refill:  0  . Lurasidone HCl (LATUDA) 60 MG TABS    Sig: Take by mouth daily. Pt was RX mental health for Biopar disease.      Follow up plan: Return in about 3 months (around 01/09/2016), or if symptoms worsen or fail to improve.

## 2015-10-09 NOTE — Assessment & Plan Note (Signed)
Restarted lovastatin once daily.

## 2015-10-12 ENCOUNTER — Ambulatory Visit: Payer: Self-pay | Attending: Orthopedic Surgery

## 2015-10-12 DIAGNOSIS — M25511 Pain in right shoulder: Secondary | ICD-10-CM | POA: Insufficient documentation

## 2015-10-12 DIAGNOSIS — M25512 Pain in left shoulder: Secondary | ICD-10-CM | POA: Insufficient documentation

## 2015-10-12 DIAGNOSIS — M6281 Muscle weakness (generalized): Secondary | ICD-10-CM | POA: Insufficient documentation

## 2015-10-12 NOTE — Patient Instructions (Signed)
On your back: arms straight, push your shoulder blade forward. Hold for 5 seconds Repeat 10 times Perform 3 sets daily    Standing, with elbows bent to 90 degrees, rotate hands out in a pain-free range, resisting the yellow band 10 times Perform 3 sets daily

## 2015-10-12 NOTE — Therapy (Signed)
[facility] [address] [place], [place], [address] Phone: [phone]   Fax:  [phone]  Physical Therapy Screen  Patient Details  Name: Jorge JarvisRichard L Jordan MRN: [id number] Date of Birth: 08/06/1960 Referring Provider: Juanell FairlyKevin Krasinski, MD  Encounter Date: 10/12/2015      PT End of Session - 10/12/15 1349    Visit Number 1   Number of Visits 1   PT Start Time 1349   PT Stop Time 1453   PT Time Calculation (min) 64 min   Activity Tolerance Patient tolerated treatment well   Behavior [facility] Therapy [facility] for tasks assessed/performed      Past Medical History  Diagnosis Date  . Arthritis   . Shoulder bursitis     both   . Tendinitis     elbow  . Foot deformity     foot arthrodesis L foot  . Hypertension   . Hyperlipidemia   . Anxiety   . Allergy   . Depression   . Osteoporosis     Past Surgical History  Procedure Laterality Date  . Foot surgery    . Shoulder fusion surgery    . Wrist surgery      ganglion cyst  . Rotator cuff repair Left     There were no vitals filed for this visit.       Subjective Assessment - 10/12/15 1356    Subjective R shoulder: 4/10 currently (pt sitting), 10/10 at worst. L shoulder: 2/10 currently, 10/10 at worst   Pertinent History Bilateral shoulder pain. L shoulder pain began 15 years ago after falling onto it. Used to work as a Astronomerprinting press operator. Was diagnosed with a torn rotator cuff which was repaired. R shoulder pain began 2015. Pt states falling before onto his R shoulder from a previous job. Was diagnosed with a torn rotator cuff for his R shoulder in October 2015. Pt states that the repetitive heavy lifting as a prinitng press operator might have contributed to his shoulder pain and R lateral elbow pain. Denies chest pain or difficulty breathing.  Pt states that his R shoulder has not been repaired.    Patient Stated Goals Just some alleviation of pain, maybe  more mobility   Currently in Pain? Yes   Pain Score --  please see subjective   Pain Location Shoulder   Pain Orientation Right;Left   Pain Descriptors / Indicators Aching;Stabbing;Pins and needles   Pain Type Chronic pain   Pain Onset More than a month ago   Pain Frequency Constant   Aggravating Factors  driving, holding onto rails at the heart rate monitor at the treadmill (increases finger numbness all digits L > R), bringing his arms up to the side (about 85 degrees abduction bilaterally), reaching behind him to wash his back.             [facility] Assessment - 10/12/15 1406    Assessment   Medical Diagnosis Bilateral shoulder impingement   Referring Provider Juanell FairlyKevin Krasinski, MD   Onset Date/Surgical Date --  Chronic   Hand Dominance Right   Next MD Visit No follow up appointment yet.    Prior Therapy Had PT for L shoulder following rotator cuff repair in 2009. Has not had PT for R shoulder.    Precautions   Precaution Comments No known precautions    Restrictions   Other Position/Activity Restrictions No known restrictions   Balance Screen   Has the patient fallen  in the past 6 months No   Has the patient had a decrease in activity level because of a fear of falling?  No   Is the patient reluctant to leave their home because of a fear of falling?  No   Prior Function   Vocation Unemployed   Vocation Requirements PLOF: better able to raise his arms, reach with less pain.    Observation/Other Assessments   Observations soreness with AC compression R side; not at the L. (+) empty can bilateral UE. (+) open can bilateral UE (felt better than empty can test). (+) Michel Bickers test and Yocum test bilaterally.    Quick DASH  50%   Posture/Postural Control   Posture Comments Bilaterally protracted and anteriorly tipped scapulae R > L. Protracted neck.    AROM   Right Shoulder Flexion 101 Degrees  with pai; 155 degrees PROM   Right Shoulder ABduction 87 Degrees  with pain;  137 PROM   Right Shoulder Internal Rotation --  Function IR thumb to T10   Left Shoulder Flexion 106 Degrees  with pain; 152 degrees PROM   Left Shoulder ABduction 106 Degrees  with pain; 140 PROM   Left Shoulder Internal Rotation --  functional IR, thumb to T11   Cervical Flexion WFL   Cervical Extension WFL   Cervical - Right Side Bend WFL   Cervical - Left Side Bend WFL   Cervical - Right Rotation WFL   Cervical - Left Rotation [facility]   Strength   Right Shoulder Internal Rotation 4/5   Right Shoulder External Rotation 4-/5  with pain at distal insertion area   Left Shoulder Internal Rotation 5/5  with posterior shoulder pain   Left Shoulder External Rotation 4/5  with muscle belly pain   Right Elbow Flexion 4/5  with shoulder pain   Left Elbow Flexion 4-/5   Palpation   Palpation comment TTP bilateral AC joint, bilateral coracoid and acromion process L > R. Bilateral inferior scapular spine distally L > R. Decreased posterior and posterior/inferior R shoulder joint capsule mobility. Decreased posterior, posterior/inferior joint capsule mobility L shoulder.        Objectives:  Manual Therapy  Supine posterior, and posterior/inferior glide R glenohumeral joint, grade 3.   R shoulder flexion AROM improved to 119 degrees after manual therapy.   There-ex  Directed patient with supine R scapular protraction with shoulder flexed at 90 degrees 10x5 seconds  Standing bilateral shoulder ER 10x resisting yellow band at pain free range.  Reviewed HEP. Pt demonstrated and verbalized understanding.    Improved exercise technique, movement at target joints, use of target muscles after mod verbal, visual, tactile cues.                          PT Education - 10/12/15 1911    Education provided Yes   Education Details ther-ex, joint mobility, HEP, POC (continue at [facility]), HEP   Person(s) Educated Patient   Methods Explanation;Demonstration;Tactile  cues;Verbal cues;Handout   Comprehension Verbalized understanding;Returned demonstration          PT Short Term Goals - 10/12/15 1727    PT SHORT TERM GOAL #1   Title Patient will be independent with his HEP.   Baseline Patient started a limited home exercise program today and demonstrates independence.    Time 1   Period Days   Status New  Plan - 10/12/15 1710    Clinical Impression Statement Patient is a 55 year old male who came to physical therapy secondary to bilateral shoulder pain. He also presents with bilateral UE weakness, poor posture, TTP bilateral anterior and posterior shoulders, decreased bilateral glenohumeral joint mobility, positive special tests suggesting bilateral shoulder impingement and rotator cuff involvement, and difficulty performing functional tasks such as reaching behind his back, raising his arms, and driving. Patient will benefit from skilled physical therapy services to address the aforementioned deficits. This is a physcal therapy screen. Patient to go to the [facility] as he planned secondary to finances.    Rehab Potential Fair   Clinical Impairments Affecting Rehab Potential chronicity of condition   PT Frequency One time visit  Patient to continue to the [facility] as he planned.   PT Treatment/Interventions Therapeutic exercise;Manual techniques;Therapeutic activities   PT Next Visit Plan Continue with home exercise program provided as well as going to the [facility] to get more treatment as he planned.    Consulted and Agree with Plan of Care Patient      Patient will benefit from skilled therapeutic intervention in order to improve the following deficits and impairments:  Pain, Decreased strength, Improper body mechanics, Postural dysfunction  Visit Diagnosis: Pain in right shoulder  Pain in left shoulder  Muscle weakness (generalized)     Problem List Patient Active Problem List   Diagnosis Date Noted   . Elevated transaminase level 08/09/2015  . Bipolar 2 disorder ([place]) 07/28/2015  . Arthritis of shoulder 07/28/2015  . S/P ankle arthrodesis 07/28/2015  . Anxiety 06/20/2015  . Alcohol abuse 06/20/2015  . Encounter for other specified special examinations 02/11/2013  . Blood glucose elevated 08/11/2012  . Parasomnia 08/11/2012  . Nondependent opioid abuse 02/14/2012  . Chronic pain 02/13/2012  . Hay fever 02/13/2012  . BP (high blood pressure) 02/13/2012  . Hypercholesterolemia 02/13/2012    Thank you for your referral.   Joneen Boers PT, DPT   10/12/2015, 7:13 PM  [facility] [address], [place], [address] Phone: [phone]   Fax:  [phone]  Name: Jorge Jordan MRN: [id number] Date of Birth: Apr 17, 1960

## 2015-10-16 ENCOUNTER — Ambulatory Visit: Payer: Self-pay

## 2016-01-10 ENCOUNTER — Ambulatory Visit (INDEPENDENT_AMBULATORY_CARE_PROVIDER_SITE_OTHER): Payer: Self-pay | Admitting: Family Medicine

## 2016-01-10 ENCOUNTER — Encounter: Payer: Self-pay | Admitting: Family Medicine

## 2016-01-10 VITALS — BP 119/72 | HR 106 | Temp 98.7°F | Resp 16 | Ht 68.0 in | Wt 211.0 lb

## 2016-01-10 DIAGNOSIS — I10 Essential (primary) hypertension: Secondary | ICD-10-CM

## 2016-01-10 DIAGNOSIS — E7800 Pure hypercholesterolemia, unspecified: Secondary | ICD-10-CM

## 2016-01-10 DIAGNOSIS — N522 Drug-induced erectile dysfunction: Secondary | ICD-10-CM

## 2016-01-10 DIAGNOSIS — R748 Abnormal levels of other serum enzymes: Secondary | ICD-10-CM

## 2016-01-10 MED ORDER — PRAVASTATIN SODIUM 40 MG PO TABS: 40.0000 mg | ORAL_TABLET | Freq: Every day | ORAL | 3 refills | Status: DC

## 2016-01-10 MED ORDER — LISINOPRIL-HYDROCHLOROTHIAZIDE 20-25 MG PO TABS: 1.0000 | ORAL_TABLET | Freq: Every day | ORAL | 3 refills | Status: DC

## 2016-01-10 MED ORDER — SILDENAFIL CITRATE 20 MG PO TABS: ORAL_TABLET | ORAL | 3 refills | Status: DC

## 2016-01-10 NOTE — Patient Instructions (Signed)
Let's trial generic Viagra for your symptoms. If that does not help- please talk with the psychiatrist. Take 3-5 pills as needed prior to sex.   Your goal blood pressure is 140/90 Work on low salt/sodium diet - goal <1.5gm (1,500mg ) per day. Eat a diet high in fruits/vegetables and whole grains.  Look into mediterranean and DASH diet. Goal activity is 141min/wk of moderate intensity exercise.  This can be split into 30 minute chunks.  If you are not at this level, you can start with smaller 10-15 min increments and slowly build up activity. Look at [facility] for more resources

## 2016-01-10 NOTE — Assessment & Plan Note (Signed)
Restart pravastatin today since pt has stopped overuse of ibuprofen. Recheck hepatic function today. Recheck lipids in 3 mos.

## 2016-01-10 NOTE — Assessment & Plan Note (Signed)
Controlled. Continue current regimen. 

## 2016-01-10 NOTE — Progress Notes (Signed)
Subjective:    Patient ID: Jorge Jordan, male    DOB: 14-May-1960, 55 y.o.   MRN: [id number]  HPI: WLLIAM Jordan is a 55 y.o. male presenting on 01/10/2016 for Hypertension   HPI  Pt presents for follow-up of hypertension. Doing well at home. Checking at home. 120/75-80. No dizziness. No HA. No chest pain. Tolerating medication well.  Recently started on Trintellix for bipolar disorder. Changed from [place]. Lorazepam PRN as prescribed by psychiatry. However is having erectile dysfunction. Issues getting a erection-No issues with libido. Has noticed since the trintellix.   Past Medical History:  Diagnosis Date  . Allergy   . Anxiety   . Arthritis   . Depression   . Foot deformity    foot arthrodesis L foot  . Hyperlipidemia   . Hypertension   . Osteoporosis   . Shoulder bursitis    both   . Tendinitis    elbow    Current Outpatient Prescriptions on File Prior to Visit  Medication Sig  . amLODipine (NORVASC) 5 MG tablet Take 1 tablet (5 mg total) by mouth daily.  Marland Kitchen LORazepam (ATIVAN) 1 MG tablet TK 1 T PO QHS  . oxymetazoline (AFRIN NASAL SPRAY) 0.05 % nasal spray Place 2 sprays into both nostrils 2 (two) times daily. For 3 days and 3 days only. (Patient taking differently: Place 2 sprays into both nostrils as needed. For 3 days and 3 days only.)   No current facility-administered medications on file prior to visit.     Review of Systems  Constitutional: Negative for chills and fever.  HENT: Negative.   Respiratory: Negative for chest tightness, shortness of breath and wheezing.   Cardiovascular: Negative for chest pain, palpitations and leg swelling.  Gastrointestinal: Negative for abdominal pain, nausea and vomiting.  Endocrine: Negative.   Genitourinary: Negative for discharge, dysuria, penile pain, testicular pain and urgency.       Erectile dysfunction.   Musculoskeletal: Negative for arthralgias, back pain and joint swelling.  Skin: Negative.   Neurological:  Negative for dizziness, weakness, numbness and headaches.  Psychiatric/Behavioral: Negative for dysphoric mood and sleep disturbance.   Per HPI unless specifically indicated above     Objective:    BP 119/72   Pulse (!) 106   Temp 98.7 F (37.1 C) (Oral)   Resp 16   Ht 5\' 8"  (1.727 m)   Wt 211 lb (95.7 kg)   BMI 32.08 kg/m   Wt Readings from Last 3 Encounters:  01/10/16 211 lb (95.7 kg)  10/09/15 206 lb (93.4 kg)  08/09/15 205 lb (93 kg)    Physical Exam  Constitutional: He is oriented to person, place, and time. He appears well-developed and well-nourished. No distress.  HENT:  Head: Normocephalic and atraumatic.  Neck: Neck supple. No thyromegaly present.  Cardiovascular: Normal rate, regular rhythm and normal heart sounds.  Exam reveals no gallop and no friction rub.   No murmur heard. Pulmonary/Chest: Effort normal and breath sounds normal. He has no wheezes.  Abdominal: Soft. Bowel sounds are normal. He exhibits no distension. There is no tenderness. There is no rebound.  Musculoskeletal: Normal range of motion. He exhibits no edema or tenderness.  Neurological: He is alert and oriented to person, place, and time. He has normal reflexes.  Skin: Skin is warm and dry. No rash noted. No erythema.  Psychiatric: He has a normal mood and affect. His behavior is normal. Thought content normal.   Results for orders placed or  performed in visit on 08/09/15  Comprehensive metabolic panel  Result Value Ref Range   Glucose 137 (H) 65 - 99 mg/dL   BUN 17 6 - 24 mg/dL   Creatinine, Ser 1.24 0.76 - 1.27 mg/dL   GFR calc non Af Amer 65 >59 mL/min/1.73   GFR calc Af Amer 76 >59 mL/min/1.73   BUN/Creatinine Ratio 14 9 - 20   Sodium 137 134 - 144 mmol/L   Potassium 4.3 3.5 - 5.2 mmol/L   Chloride 96 96 - 106 mmol/L   CO2 23 18 - 29 mmol/L   Calcium 9.6 8.7 - 10.2 mg/dL   Total Protein 7.2 6.0 - 8.5 g/dL   Albumin 4.4 3.5 - 5.5 g/dL   Globulin, Total 2.8 1.5 - 4.5 g/dL    Albumin/Globulin Ratio 1.6 1.2 - 2.2   Bilirubin Total 0.7 0.0 - 1.2 mg/dL   Alkaline Phosphatase 95 39 - 117 IU/L   AST 39 0 - 40 IU/L   ALT 60 (H) 0 - 44 IU/L      Assessment & Plan:   Problem List Items Addressed This Visit      Cardiovascular and Mediastinum   BP (high blood pressure)    Controlled. Continue current regimen.      Relevant Medications   pravastatin (PRAVACHOL) 40 MG tablet   lisinopril-hydrochlorothiazide (PRINZIDE,ZESTORETIC) 20-25 MG tablet   sildenafil (REVATIO) 20 MG tablet   Other Relevant Orders   Comprehensive metabolic panel     Other   Hypercholesterolemia    Restart pravastatin today since pt has stopped overuse of ibuprofen. Recheck hepatic function today. Recheck lipids in 3 mos.       Relevant Medications   pravastatin (PRAVACHOL) 40 MG tablet   lisinopril-hydrochlorothiazide (PRINZIDE,ZESTORETIC) 20-25 MG tablet   sildenafil (REVATIO) 20 MG tablet    Other Visit Diagnoses    Elevated liver enzymes    -  Primary   Relevant Orders   Comprehensive metabolic panel   Drug-induced erectile dysfunction       Trial of viagra to help with symptoms.Advised talking with psychiatry to determine if there are other therapies that can help. Reviewed risks vs benefits of med   Relevant Medications   sildenafil (REVATIO) 20 MG tablet      Meds ordered this encounter  Medications  . vortioxetine HBr (TRINTELLIX) 10 MG TABS    Sig: Take 1 tablet by mouth daily.  . pravastatin (PRAVACHOL) 40 MG tablet    Sig: Take 1 tablet (40 mg total) by mouth daily.    Dispense:  90 tablet    Refill:  3    Order Specific Question:   Supervising Provider    Answer:   Arlis Porta [phone]  . lisinopril-hydrochlorothiazide (PRINZIDE,ZESTORETIC) 20-25 MG tablet    Sig: Take 1 tablet by mouth daily.    Dispense:  90 tablet    Refill:  3    Order Specific Question:   Supervising Provider    Answer:   Arlis Porta [phone]  . sildenafil (REVATIO) 20  MG tablet    Sig: Take 3-5 tablets as needed 30 prior to sex.    Dispense:  30 tablet    Refill:  3    Order Specific Question:   Supervising Provider    Answer:   Arlis Porta [phone]      Follow up plan: Return in about 3 months (around 04/11/2016), or if symptoms worsen or fail to improve, for HTN.  HLD.

## 2016-04-05 LAB — COMPREHENSIVE METABOLIC PANEL
ALT: 29 IU/L (ref 0–44)
AST: 20 IU/L (ref 0–40)
Albumin/Globulin Ratio: 1.6 (ref 1.2–2.2)
Albumin: 4.6 g/dL (ref 3.5–5.5)
Alkaline Phosphatase: 107 IU/L (ref 39–117)
BUN/Creatinine Ratio: 13 (ref 9–20)
BUN: 16 mg/dL (ref 6–24)
Bilirubin Total: 0.3 mg/dL (ref 0.0–1.2)
CO2: 22 mmol/L (ref 18–29)
Calcium: 9.8 mg/dL (ref 8.7–10.2)
Chloride: 99 mmol/L (ref 96–106)
Creatinine, Ser: 1.2 mg/dL (ref 0.76–1.27)
GFR calc Af Amer: 78 mL/min/{1.73_m2} (ref >59)
GFR calc non Af Amer: 68 mL/min/{1.73_m2} (ref >59)
Globulin, Total: 2.8 g/dL (ref 1.5–4.5)
Glucose: 111 mg/dL — ABNORMAL HIGH (ref 65–99)
Potassium: 4.2 mmol/L (ref 3.5–5.2)
Sodium: 140 mmol/L (ref 134–144)
Total Protein: 7.4 g/dL (ref 6.0–8.5)

## 2016-04-11 ENCOUNTER — Ambulatory Visit (INDEPENDENT_AMBULATORY_CARE_PROVIDER_SITE_OTHER): Payer: Self-pay | Admitting: Family Medicine

## 2016-04-11 ENCOUNTER — Encounter: Payer: Self-pay | Admitting: Family Medicine

## 2016-04-11 VITALS — BP 122/78 | HR 89 | Temp 98.3°F | Resp 16 | Ht 68.0 in | Wt 199.6 lb

## 2016-04-11 DIAGNOSIS — I10 Essential (primary) hypertension: Secondary | ICD-10-CM

## 2016-04-11 DIAGNOSIS — Z87898 Personal history of other specified conditions: Secondary | ICD-10-CM

## 2016-04-11 DIAGNOSIS — M19019 Primary osteoarthritis, unspecified shoulder: Secondary | ICD-10-CM

## 2016-04-11 DIAGNOSIS — R7989 Other specified abnormal findings of blood chemistry: Secondary | ICD-10-CM

## 2016-04-11 DIAGNOSIS — R740 Nonspecific elevation of levels of transaminase and lactic acid dehydrogenase [LDH]: Secondary | ICD-10-CM

## 2016-04-11 DIAGNOSIS — R7309 Other abnormal glucose: Secondary | ICD-10-CM

## 2016-04-11 DIAGNOSIS — G5603 Carpal tunnel syndrome, bilateral upper limbs: Secondary | ICD-10-CM

## 2016-04-11 DIAGNOSIS — E782 Mixed hyperlipidemia: Secondary | ICD-10-CM

## 2016-04-11 DIAGNOSIS — R7401 Elevation of levels of liver transaminase levels: Secondary | ICD-10-CM

## 2016-04-11 DIAGNOSIS — F1011 Alcohol abuse, in remission: Secondary | ICD-10-CM

## 2016-04-11 NOTE — Assessment & Plan Note (Signed)
Recent significantly elevated glucose (reportedly fasting) over past 9 months, prior chart review A1c 5.4 in 2014, concern with family history of DM mother, wt is improved, and diet seems improved, also now alcohol free x 3 months should help improve blood sugar.  Plan: 1. Check A1c, [facility] draw, follow-up 2. If new dx Pre-DM will discuss plan and consider Metformin therapy etc 3. Follow-up closely and anticipate repeat A1c 3 months, otherwise if >6.5 will likely re-check sooner to confirm

## 2016-04-11 NOTE — Assessment & Plan Note (Signed)
Resolved LFTs, gradual trended down since 06/2015, likely due to alcohol abuse, now alcohol free since 01/19/16, also he has been off of statin since 07/2015 but LFT were still elevated off statin, so less likely cause. - Anticipate will resume pravastatin now - Remain off alcohol - Follow-up LFTs 6 months with [facility]

## 2016-04-11 NOTE — Assessment & Plan Note (Signed)
Last lipids 06/2015, abnormal TG 277, LDL 109, normal HDL 46. He has remained off Pravastatin since 07/2015, states he did not resume in 01/2016. - Last ASCVD risk 6.8%, but concern with elevated glucose, may be Pre-DM or possibly DM  Plan: 1. Check fasting lipid and A1c today, re-calculate ASCVD risk, based on A1c and lipids, anticipate will benefit from statin therapy, previously tolerated Pravastatin 40 well, likely resume this otherwise can consider half dose if dramatically improved lipids / ASCVD and controlled A1c now off alcohol and improved lifestyle

## 2016-04-11 NOTE — Assessment & Plan Note (Addendum)
Recent trend stable elevated Cr 1.2 baseline over past 9 months, no prior lab values. No known history of CKD. - Has HTN, better controlled recently. Also abnormal glucose, concern for Pre-DM/DM - Off of NSAIDs >6 months now, also off of alcohol, some poor hydration  Plan: 1. Advised remain off NSAIDs, improve hydration. Continue manage BP / sugar 2. Follow-up 6 months trend Cr with [facility]

## 2016-04-11 NOTE — Assessment & Plan Note (Addendum)
Bilateral arthritis in shoulders, s/p L rotator cuff repair, reported R torn rotator cuff has not been able to afford surgery, has some limitations with this and pain / muscle spasms. - May be related to carpal tunnel symptoms, however does not explain bilateral - Conservative measures, Tylenol up to 2g, follow-up Ortho

## 2016-04-11 NOTE — Assessment & Plan Note (Signed)
Well-controlled HTN. Initial SBP 130s, manual re-check improved, patient admits mild anxiety with new provider Possible complication with elevated Cr b/l 1.2, may reflect CKD-II  Plan:  1. Continue current Lisinopril-HCTZ 20-25mg  daily, Amlodipine 5mg  2. Re-check chemistry trend Cr in next 6-12 months, sooner if significantly abnormal A1c 3. Lifestyle Mods, encouraged continue diet, advised low salt and exercise 4. Continue to monitor BP at home 5. Follow-up 3-6 months

## 2016-04-11 NOTE — Assessment & Plan Note (Signed)
Now alcohol free since 01/19/16, he is established with Psychiatry and doing much better on regimen with Ativan PRN and mood stabilizer. - Admits prior self medicating with alcohol when out of job and unable to follow psych for treatment, had been on BDZ for years prior to that - Encouraged to remain alcohol free, follow-up as needed

## 2016-04-11 NOTE — Patient Instructions (Signed)
Thank you for coming in to clinic today.  1. We will check A1c screening for Diabetes, you have had some abnormal fasting blood sugars in the past and i'm concerned about possible Pre-Diabetes. Will follow-up results  2. Also checking Lipid panel, fasting. Last one in March 2017, we will see if it is improved with lifestyle changes and now that you are off alcohol. - Most likely we will end up starting the Pravastatin  3. Continue ASA 81 daily  Recommend to start taking Tylenol Extra Strength 500mg  tabs - take 1 to 2 tabs per dose (max 1000mg ) every 6-8 hours for pain, max 24 hour daily dose is 4 tablets or 2000mg .  Recommend Wrist Splints (often called Thumb Spica splint or find a firm wrist splint to avoid flexion), especially wearing overnight and active with repetitive actions, to avoid worsening symptoms. - Check in with [facility] when you are ready to discuss further  Please schedule a follow-up appointment with Dr. Parks Ranger in 3 months for Blood Sugar A1c, Cholesterol  If you have any other questions or concerns, please feel free to call the clinic or send a message through [place]. You may also schedule an earlier appointment if necessary.  Jorge Putnam, DO [place]

## 2016-04-11 NOTE — Progress Notes (Signed)
Subjective:    Patient ID: Jorge Jordan, male    DOB: [birth year], 56 y.o.   MRN: [id number]  Jorge Jordan is a 56 y.o. male presenting on 04/11/2016 for Hypertension (3 month follow up) and Hyperlipidemia  Establish care with new provider.  HPI   CHRONIC HTN: Reports checks BP at home regularly, avg 120/70s. Current Meds - Lisinopril-HCTZ 20-25mg  daily, Amlodipine 5mg  daily Reports good compliance, took meds today. Tolerating well, w/o complaints. - Admits occasional mild lightheadedness upon standing only occasionally, brief episodes self limited, admits does not drink enough water regularly Denies CP, dyspnea, HA, edema, dizziness, near syncope or syncope  Abnormal Glucose/Hyperglycemia / OBESITY BMI 30 - Reports prior history of abnormal glucose but never diagnosed with Pre-DM. Chart review with old A1c 2014 with 5.4. More recent labs with fasting glucose readings 110-130. Family history Mother with DM. - He has never taken metformin or other medications for this - Wt loss 211 down to 196, reduced portion size, stopped late night eating, tried to reduce greasy fast foods, some exercise but not regularly - Taking ASA 81mg  daily - Family history of early cardiac risk with maternal grandfather age 74 passed from MI  Elevated Creatinine / Possible CKD-II - Recent lab values with baseline 1.2 (since 06/2015). He has been off of NSAIDs for >6 months was advised not to take. Does admit to some poor hydration with water at times.  HYPERLIPIDEMIA / ELEVATED LFTs / H/o Alcohol Abuse - Chronic history of elevated cholesterol. Last lipid panel 06/2015 with abnormal LDL, TG. He had previously been on Pravastatin 40mg  daily for few years, tolerated well without myalgias, advised to stop in 07/2015 by prior PCP with mildly elevated LFTs, also thought due to alcohol use. Currently remains off Pravastatin, and stopped Alcohol 01/2016 - He reports long history of Anxiety / PTSD, previously treated  with Klonazepam for about 4 years, and had been without of job, anxiety is dramatically worsened by SSRI when he was taken off BDZ, he was self medicating with alcohol in the interval until recently. - Last chemistry checked 12/28 with resolved LFTs back to normal since off alcohol 01/19/16, he remains alcohol free  CARPAL TUNNEL SYNDROME / History of Shoulder OA / R-Torn Rotator Cuff Reports prior history of early stages Carpal Tunnel in his hands, states recently he has had some worsening over past 1-2 months with bilateral hand symptoms with numbness, tingling, and sharp pains, usually occurs in all 4 fingers and thumb, seems to alternate sides one is not worse than other, if provoking it can last for minutes (not persistent for hours), does improve with position change. Worse overnight can wake up due to it and can be present in morning, during day if driving or lifting arm and will get symptoms. - No treatments tried. Currently not taking NSAIDs, he was previously to avoid these and take ASA 81 daily. 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   Social History  Substance Use Topics  . Smoking status: Never Smoker  . Smokeless tobacco: Never Used  . Alcohol use No     Comment: Abstaining from alcohol since 01/19/16    Review of Systems Per HPI unless specifically indicated above     Objective:      BP 122/78 (BP Location: Left Arm, Cuff Size: Normal)   Pulse 89   Temp 98.3 F (36.8 C) (Oral)   Resp 16   Ht 5\' 8"  (1.727 m)   Wt 199 lb 9.6 oz (90.5 kg)   BMI 30.35 kg/m   Wt Readings from Last 3 Encounters:  04/11/16 199 lb 9.6 oz (90.5 kg)  01/10/16 211 lb (95.7 kg)  10/09/15 206 lb (93.4 kg)    Physical Exam  Constitutional: He appears well-developed and well-nourished. No distress.  Well-appearing, comfortable, cooperative  HENT:  Head: Normocephalic and atraumatic.  Mouth/Throat: Oropharynx is clear and moist.  Eyes: Conjunctivae and EOM are normal. Pupils are equal, round, and reactive to light.  Neck: Normal range of motion. Neck supple. No thyromegaly present.  No carotid bruit  Cardiovascular: Normal rate, regular rhythm, normal heart sounds and intact distal pulses.   No murmur heard. Pulmonary/Chest: Breath sounds normal. No respiratory distress. He has no wheezes. He has no rales.  Musculoskeletal: He exhibits no edema or tenderness.  Bilateral Hand/Wrist Inspection: Normal appearance, symmetrical, no bulky MCP joints, no edema or erythema. Old surgical scar Right wrist from prior fracture and cyst removal Palpation: Non tender hand / wrist, carpal bones, including MCP, base of thumb. No distinct anatomical snuff box or scaphoid tenderness. No tenderness over APL / EPB tendons radially. ROM: Slightly reduced wrist flex/ext due to some stiffness but overall good preserved active wrist ROM flex / ext, ulnar / radial deviation without pain Special Testing: Negative Finkelstein's test. Negative Tinel's median and ulnar nerve at elbows Strength: 5/5 grip, thumb opposition, wrist flex/ext Neurovascular: distally intact  Right Shoulder Inspection: Normal appearance bilateral symmetrical, some hypertrophy/spasm with trapezius muscles laterally over shoulder R>L Palpation: Non-tender to palpation over anterior, lateral, or posterior shoulder ROM: Some reduced ROM and  stiffness in R > L shoulder Neurovascular: Distally intact pulses, sensation to light touch  Lymphadenopathy:    He has no cervical adenopathy.  Neurological: He is alert.  Skin: Skin is warm and dry. No rash noted. He is not diaphoretic. No erythema.  Psychiatric: He has a normal mood and affect. His behavior is normal.  Well groomed, not anxious appearing, good eye contact, normal speech and thought  Nursing note and vitals reviewed.    I have personally reviewed the following lab results from 01/10/16.  Results for orders placed or performed in visit on 01/10/16  Comprehensive metabolic panel  Result Value Ref Range   Glucose 111 (H) 65 - 99 mg/dL   BUN 16 6 - 24 mg/dL   Creatinine, Ser 1.20 0.76 - 1.27 mg/dL   GFR calc non Af Amer 68 >59 mL/min/1.73   GFR calc Af Amer 78 >59 mL/min/1.73   BUN/Creatinine Ratio 13 9 - 20   Sodium 140 134 - 144 mmol/L   Potassium 4.2 3.5 - 5.2 mmol/L   Chloride 99 96 - 106 mmol/L   CO2 22 18 - 29 mmol/L   Calcium 9.8 8.7 - 10.2 mg/dL   Total Protein 7.4 6.0 - 8.5 g/dL   Albumin 4.6 3.5 - 5.5 g/dL   Globulin, Total 2.8 1.5 - 4.5 g/dL  Albumin/Globulin Ratio 1.6 1.2 - 2.2   Bilirubin Total 0.3 0.0 - 1.2 mg/dL   Alkaline Phosphatase 107 39 - 117 IU/L   AST 20 0 - 40 IU/L   ALT 29 0 - 44 IU/L      Lipid Panel     Component Value Date/Time   CHOL 210 (H) 06/07/2015 0821   TRIG 277 (H) 06/07/2015 0821   HDL 46 06/07/2015 0821   CHOLHDL 4.6 06/07/2015 0821   LDLCALC 109 (H) 06/07/2015 0821     Assessment & Plan:   Problem List Items Addressed This Visit    Hyperlipidemia    Last lipids 06/2015, abnormal TG 277, LDL 109, normal HDL 46. He has remained off Pravastatin since 07/2015, states he did not resume in 01/2016. - Last ASCVD risk 6.8%, but concern with elevated glucose, may be Pre-DM or possibly DM  Plan: 1. Check fasting lipid and A1c today, re-calculate ASCVD risk, based on A1c and lipids, anticipate will benefit from statin  therapy, previously tolerated Pravastatin 40 well, likely resume this otherwise can consider half dose if dramatically improved lipids / ASCVD and controlled A1c now off alcohol and improved lifestyle      Relevant Orders   Lipid panel   History of alcohol abuse    Now alcohol free since 01/19/16, he is established with Psychiatry and doing much better on regimen with Ativan PRN and mood stabilizer. - Admits prior self medicating with alcohol when out of job and unable to follow psych for treatment, had been on BDZ for years prior to that - Encouraged to remain alcohol free, follow-up as needed      Essential hypertension - Primary    Well-controlled HTN. Initial SBP 130s, manual re-check improved, patient admits mild anxiety with new provider Possible complication with elevated Cr b/l 1.2, may reflect CKD-II  Plan:  1. Continue current Lisinopril-HCTZ 20-25mg  daily, Amlodipine 5mg  2. Re-check chemistry trend Cr in next 6-12 months, sooner if significantly abnormal A1c 3. Lifestyle Mods, encouraged continue diet, advised low salt and exercise 4. Continue to monitor BP at home 5. Follow-up 3-6 months      Elevated transaminase level    Resolved LFTs, gradual trended down since 06/2015, likely due to alcohol abuse, now alcohol free since 01/19/16, also he has been off of statin since 07/2015 but LFT were still elevated off statin, so less likely cause. - Anticipate will resume pravastatin now - Remain off alcohol - Follow-up LFTs 6 months with CMET      Elevated serum creatinine    Recent trend stable elevated Cr 1.2 baseline over past 9 months, no prior lab values. No known history of CKD. - Has HTN, better controlled recently. Also abnormal glucose, concern for Pre-DM/DM - Off of NSAIDs >6 months now, also off of alcohol, some poor hydration  Plan: 1. Advised remain off NSAIDs, improve hydration. Continue manage BP / sugar 2. Follow-up 6 months trend Cr with CMET      Bilateral  carpal tunnel syndrome    Suspected bilateral carpal tunnel given history, no significant symptoms provoked today. Some confusion with 4th/5th digit symptoms as well, seems more ulnar nerve, but symptoms localized to both hands only. Possible other nerve entrapment in shoulder/neck, but again negative exam. Given significant shoulder pathology may have other chronic nerve injury.  Plan: 1. Advised to try conservative measures with bilateral wrist splints for support reduce flexion / repetitive motions during driving and activity and overnight, also starting some Tylenol  up to 2g daily max dose, with concern for prior abnormal LFTs 2. He plans to follow-up with [facility] Dr Mack Guise as planned, but due to cost with no ins is waiting 3. Consider future NCS / Neuro evaluation      Arthritis of shoulder    Bilateral arthritis in shoulders, s/p L rotator cuff repair, reported R torn rotator cuff has not been able to afford surgery, has some limitations with this and pain / muscle spasms. - May be related to carpal tunnel symptoms, however does not explain bilateral - Conservative measures, Tylenol up to 2g, follow-up Ortho      Abnormal glucose    Recent significantly elevated glucose (reportedly fasting) over past 9 months, prior chart review A1c 5.4 in 2014, concern with family history of DM mother, wt is improved, and diet seems improved, also now alcohol free x 3 months should help improve blood sugar.  Plan: 1. Check A1c, [facility] draw, follow-up 2. If new dx Pre-DM will discuss plan and consider Metformin therapy etc 3. Follow-up closely and anticipate repeat A1c 3 months, otherwise if >6.5 will likely re-check sooner to confirm      Relevant Orders   Hemoglobin A1c      No orders of the defined types were placed in this encounter.     Follow up plan: Return in about 3 months (around 07/10/2016) for Blood Sugar A1c Cholesterol HTN.  Nobie Putnam, [facility] 04/11/2016, 11:41 AM

## 2016-04-11 NOTE — Assessment & Plan Note (Signed)
Suspected bilateral carpal tunnel given history, no significant symptoms provoked today. Some confusion with 4th/5th digit symptoms as well, seems more ulnar nerve, but symptoms localized to both hands only. Possible other nerve entrapment in shoulder/neck, but again negative exam. Given significant shoulder pathology may have other chronic nerve injury.  Plan: 1. Advised to try conservative measures with bilateral wrist splints for support reduce flexion / repetitive motions during driving and activity and overnight, also starting some Tylenol up to 2g daily max dose, with concern for prior abnormal LFTs 2. He plans to follow-up with [facility] Dr Mack Guise as planned, but due to cost with no ins is waiting 3. Consider future NCS / Neuro evaluation

## 2016-04-12 ENCOUNTER — Encounter: Payer: Self-pay | Admitting: Family Medicine

## 2016-04-12 LAB — LIPID PANEL
Chol/HDL Ratio: 6.1 ratio units — ABNORMAL HIGH (ref 0.0–5.0)
Cholesterol, Total: 249 mg/dL — ABNORMAL HIGH (ref 100–199)
HDL: 41 mg/dL (ref >39)
LDL Calculated: 175 mg/dL — ABNORMAL HIGH (ref 0–99)
Triglycerides: 163 mg/dL — ABNORMAL HIGH (ref 0–149)
VLDL Cholesterol Cal: 33 mg/dL (ref 5–40)

## 2016-04-12 LAB — HEMOGLOBIN A1C
Est. average glucose Bld gHb Est-mCnc: 123 mg/dL
Hgb A1c MFr Bld: 5.9 % — ABNORMAL HIGH (ref 4.8–5.6)

## 2016-04-12 NOTE — Addendum Note (Signed)
Addended by: Olin Hauser on: 04/12/2016 05:17 PM   Modules accepted: Orders

## 2016-04-15 ENCOUNTER — Telehealth: Payer: Self-pay | Admitting: Family Medicine

## 2016-04-15 NOTE — Telephone Encounter (Signed)
Returned call to patient, made him aware lab results were posted on mychart. We did go over results and plan.

## 2016-04-15 NOTE — Telephone Encounter (Signed)
Pt called for lab results [phone]

## 2016-07-05 ENCOUNTER — Other Ambulatory Visit: Payer: Self-pay

## 2016-07-05 DIAGNOSIS — R7309 Other abnormal glucose: Secondary | ICD-10-CM

## 2016-07-05 DIAGNOSIS — E782 Mixed hyperlipidemia: Secondary | ICD-10-CM

## 2016-07-06 LAB — LIPID PANEL
Chol/HDL Ratio: 4.1 ratio units (ref 0.0–5.0)
Cholesterol, Total: 171 mg/dL (ref 100–199)
HDL: 42 mg/dL (ref >39)
LDL Calculated: 100 mg/dL — ABNORMAL HIGH (ref 0–99)
Triglycerides: 143 mg/dL (ref 0–149)
VLDL Cholesterol Cal: 29 mg/dL (ref 5–40)

## 2016-07-06 LAB — HEMOGLOBIN A1C
Est. average glucose Bld gHb Est-mCnc: 123 mg/dL
Hgb A1c MFr Bld: 5.9 % — ABNORMAL HIGH (ref 4.8–5.6)

## 2016-07-10 ENCOUNTER — Encounter: Payer: Self-pay | Admitting: Family Medicine

## 2016-07-10 ENCOUNTER — Ambulatory Visit (INDEPENDENT_AMBULATORY_CARE_PROVIDER_SITE_OTHER): Payer: Self-pay | Admitting: Family Medicine

## 2016-07-10 VITALS — BP 118/78 | HR 90 | Temp 98.8°F | Resp 16 | Ht 68.0 in | Wt 198.0 lb

## 2016-07-10 DIAGNOSIS — E782 Mixed hyperlipidemia: Secondary | ICD-10-CM

## 2016-07-10 DIAGNOSIS — R7303 Prediabetes: Secondary | ICD-10-CM

## 2016-07-10 DIAGNOSIS — R740 Nonspecific elevation of levels of transaminase and lactic acid dehydrogenase [LDH]: Secondary | ICD-10-CM

## 2016-07-10 DIAGNOSIS — R7401 Elevation of levels of liver transaminase levels: Secondary | ICD-10-CM

## 2016-07-10 DIAGNOSIS — I10 Essential (primary) hypertension: Secondary | ICD-10-CM

## 2016-07-10 DIAGNOSIS — R7989 Other specified abnormal findings of blood chemistry: Secondary | ICD-10-CM

## 2016-07-10 NOTE — Assessment & Plan Note (Signed)
Improved control LDL 170 to 100 back on statin ASCVD Risk 10 yr at 9.1%, concern with Pre-DM, HTN Without myalgias  Plan: 1. Continue current Pravastatin 40mg  daily 2. Check CMET for LFT monitoring now back on statin in 3 months with other labs 3. Encouraged lifestyle changes

## 2016-07-10 NOTE — Assessment & Plan Note (Signed)
Resolved elevated LFTs, gradual trended down since 06/2015, likely due to alcohol abuse (sober since 01/2016). Less likely due to statin therapy  Plan: 1. Since resumed pravastatin 03/2016, will re-check CMET for LFTs in 3 months for trend

## 2016-07-10 NOTE — Assessment & Plan Note (Signed)
Well-controlled HTN Suspected complication with stable elevated Cr b/l 1.2, likely CKD-II  Plan:  1. Continue current Lisinopril-HCTZ 20-25mg  daily, Amlodipine 5mg  2. Re-check chemistry trend Cr with CMET in 3 months (6 months after last lab) 3. Lifestyle Mods, encouraged continue diet, advised low salt and exercise with plan for regular walking 3 days weekly 45 min per 4. Continue to monitor BP at home 5. Follow-up 6 months Annual Physical

## 2016-07-10 NOTE — Patient Instructions (Signed)
Thank you for coming in to clinic today.  1. Good news on cholesterol - LDL 175 down to 100, continue pravastatin 40mg  daily as you are - total chol 249 to 171  2. A1c 5.9 unchanged, still confirmed Pre-Diabetes  Plan to keep up good work towards your goal of regular walking, work on low carb diet, and improve water hydration  In future if A1c >6.0 we can re-consider adding on the Metformin if needed, would be 500mg  tablets, take always with food, usually start ONE pill with dinner for 1-2 weeks or more as tolerated (if no GI symptoms, upset stomach, diarrhea, nausea) then can increase to one 500mg  pill TWICE daily with food.  Please schedule a follow-up appointment with Dr. Parks Ranger in 6 months for Annual Physical, and likely A1c POC  If you have any other questions or concerns, please feel free to call the clinic or send a message through [place]. You may also schedule an earlier appointment if necessary.  Nobie Putnam, DO [place]

## 2016-07-10 NOTE — Assessment & Plan Note (Signed)
Recent trend stable elevated Cr 1.2, suspected CKD-II Chronic HTN controlled, Pre-DM - Remains off NSAIDS >9 months now, off alcohol 01/2016  Plan: 1. Check CMET for Cr trend in 3 months, then next visit 6 months for Annual Physical 2. Encourage remain alcohol free, remain off NSAIDs, improve hydration 3. Control BP and sugar

## 2016-07-10 NOTE — Progress Notes (Signed)
Subjective:    Patient ID: Jorge Jordan, male    DOB: [birth year], 56 y.o.   MRN: [id number]  Jorge Jordan is a 56 y.o. male presenting on 07/10/2016 for Hypertension, Pre-Diabetes, Cholesterol  HPI   Pre-Diabetes / Obesity BMI >30 - Last visit 03/2017 with elevated A1c 5.9, with new diagnosis of Pre-DM - Today patient presents with repeat A1c at 5.9, he has not made many significant lifestyle changes, but he is planning goal to start more regular walking exercise, goal to start walking 3 times weekly for 45 min or more. Not following DM diet or low carb, but does not eat as much sugar regardless. - He is drinking more water now, does drink some diet soda - Weight is down 1 lb, in the past he has had better weight loss 10+ lbs before with reduced portion size and improved poor dietary habits - He has never taken metformin or other medications for this - Taking ASA 81mg  daily - Family history of DM - Denies polyuria, unintentional weight gain, hypoglycemia, numbness, tingling  CHRONIC HTN: Reports checks BP at home regularly, avg 120/70s. Current Meds - Lisinopril-HCTZ 20-25mg  daily, Amlodipine 5mg  daily Reports good compliance, took meds today. Tolerating well, w/o complaints. Resolved lightheadedness Denies CP, dyspnea, HA, edema, dizziness, near syncope or syncope  Elevated Creatinine / Possible CKD-II - Recent lab values with baseline 1.2 (since 06/2015). He has been off of NSAIDs for >6 months was advised not to take. - Today reports he is trying to improve hydration with increased water intake - Interested to re-check chemistry at next visit  [place] / ELEVATED LFTs / H/o Alcohol Abuse - Prior elevated LFTs from chart review both AST and ALT (higher) only mild elevated, in past discussion was if this was due to statin vs alcohol - Today he reports he remains alcohol free (since 01/2016), since quitting alcohol LFTs have improved - Last visit 03/2016 he was advised to  resume statin with pravastatin 40mg  daily, he has tolerated this well without myalgias - Now reviewed fasting lipids 3 months later on statin with significantly improved LDL from 170 to 100, other numbers improved as well  Additionally states he plans to go back to [facility] to follow-up his concerns of Carpal Tunnel Syndrome we discussed last time.  Health Maintenance: - Did home FOBT from [facility] at discount, awaiting results, never had colonoscopy, asymptomatic (without abdominal pain, constipation, diarrhea, dark stools, blood in stool, early satiety, unintentional weight loss)  Social History  Substance Use Topics  . Smoking status: Never Smoker  . Smokeless tobacco: Never Used  . Alcohol use No     Comment: Abstaining from alcohol since 01/19/16    Review of Systems Per HPI unless specifically indicated above     Objective:    BP 118/78   Pulse 90   Temp 98.8 F (37.1 C) (Oral)   Resp 16   Ht 5\' 8"  (1.727 m)   Wt 198 lb (89.8 kg)   BMI 30.11 kg/m   Wt Readings from Last 3 Encounters:  07/10/16 198 lb (89.8 kg)  04/11/16 199 lb 9.6 oz (90.5 kg)  01/10/16 211 lb (95.7 kg)    Physical Exam  Constitutional: He appears well-developed and well-nourished. No distress.  Well-appearing, comfortable, cooperative  HENT:  Head: Normocephalic and atraumatic.  Mouth/Throat: Oropharynx is clear and moist.  Eyes: Conjunctivae are normal.  Neck: Normal range of motion. Neck supple.  Cardiovascular: Normal rate, regular rhythm, normal heart  sounds and intact distal pulses.   No murmur heard. Pulmonary/Chest: Effort normal and breath sounds normal. No respiratory distress. He has no wheezes. He has no rales.  Musculoskeletal: He exhibits no edema.  Neurological: He is alert.  Skin: Skin is warm and dry. He is not diaphoretic.  Psychiatric: He has a normal mood and affect. His behavior is normal.  Well groomed, good eye contact, normal speech and thoughts  Nursing note and  vitals reviewed.    I have personally reviewed the following lab results from 07/05/16.  Results for orders placed or performed in visit on 07/05/16  Lipid panel  Result Value Ref Range   Cholesterol, Total 171 100 - 199 mg/dL   Triglycerides 143 0 - 149 mg/dL   HDL 42 >39 mg/dL   VLDL Cholesterol Cal 29 5 - 40 mg/dL   LDL Calculated 100 (H) 0 - 99 mg/dL   Chol/HDL Ratio 4.1 0.0 - 5.0 ratio units  Hemoglobin A1c  Result Value Ref Range   Hgb A1c MFr Bld 5.9 (H) 4.8 - 5.6 %   Est. average glucose Bld gHb Est-mCnc 123 mg/dL       Assessment & Plan:   Problem List Items Addressed This Visit    Pre-diabetes - Primary    Recent diagnosis with prior A1c 5.9, now confirmed again 5.9 Some family history DM  Plan: 1. Discussion today on diagnosis, prognosis, and treatment of Pre-DM and even some discussion on diabetes. Emphasis on lifestyle modification with continue current plan for regular walking 3 x weekly 45 min per or more, improve diet with low carb improvements, continue improve water intake 2. Offered DM education/nutrition course, will hold for now 3. Discussion on future may need Metformin if increasing A1c >6.0, consider starting 500mg  nightly then 500 BID if needed 4. Check A1c in 3 months, then likely again in 6 months for Annual Physical      Relevant Orders   Hemoglobin A1c   Hyperlipidemia    Improved control LDL 170 to 100 back on statin ASCVD Risk 10 yr at 9.1%, concern with Pre-DM, HTN Without myalgias  Plan: 1. Continue current Pravastatin 40mg  daily 2. Check CMET for LFT monitoring now back on statin in 3 months with other labs 3. Encouraged lifestyle changes      Essential hypertension    Well-controlled HTN Suspected complication with stable elevated Cr b/l 1.2, likely CKD-II  Plan:  1. Continue current Lisinopril-HCTZ 20-25mg  daily, Amlodipine 5mg  2. Re-check chemistry trend Cr with CMET in 3 months (6 months after last lab) 3. Lifestyle Mods,  encouraged continue diet, advised low salt and exercise with plan for regular walking 3 days weekly 45 min per 4. Continue to monitor BP at home 5. Follow-up 6 months Annual Physical      Relevant Orders   Comprehensive metabolic panel   Elevated transaminase level    Resolved elevated LFTs, gradual trended down since 06/2015, likely due to alcohol abuse (sober since 01/2016). Less likely due to statin therapy  Plan: 1. Since resumed pravastatin 03/2016, will re-check CMET for LFTs in 3 months for trend      Relevant Orders   Comprehensive metabolic panel   Elevated serum creatinine    Recent trend stable elevated Cr 1.2, suspected CKD-II Chronic HTN controlled, Pre-DM - Remains off NSAIDS >9 months now, off alcohol 01/2016  Plan: 1. Check CMET for Cr trend in 3 months, then next visit 6 months for Annual Physical 2. Encourage remain alcohol  free, remain off NSAIDs, improve hydration 3. Control BP and sugar      Relevant Orders   Comprehensive metabolic panel      No orders of the defined types were placed in this encounter.     Follow up plan: Return in about 6 months (around 01/09/2017) for Annual Physical.  Nobie Putnam, DO [facility] 07/10/2016, 5:03 PM

## 2016-07-10 NOTE — Assessment & Plan Note (Signed)
Recent diagnosis with prior A1c 5.9, now confirmed again 5.9 Some family history DM  Plan: 1. Discussion today on diagnosis, prognosis, and treatment of Pre-DM and even some discussion on diabetes. Emphasis on lifestyle modification with continue current plan for regular walking 3 x weekly 45 min per or more, improve diet with low carb improvements, continue improve water intake 2. Offered DM education/nutrition course, will hold for now 3. Discussion on future may need Metformin if increasing A1c >6.0, consider starting 500mg  nightly then 500 BID if needed 4. Check A1c in 3 months, then likely again in 6 months for Annual Physical

## 2016-08-05 ENCOUNTER — Other Ambulatory Visit: Payer: Self-pay

## 2016-08-05 DIAGNOSIS — I10 Essential (primary) hypertension: Secondary | ICD-10-CM

## 2016-08-05 MED ORDER — AMLODIPINE BESYLATE 5 MG PO TABS: 5.0000 mg | ORAL_TABLET | Freq: Every day | ORAL | 3 refills | Status: DC

## 2016-08-05 NOTE — Telephone Encounter (Signed)
Pharmacy requesting refill Last ov 07/10/16  Last filled 08/09/15 Please review. Thank you. sd

## 2016-11-04 ENCOUNTER — Telehealth: Payer: Self-pay | Admitting: Family Medicine

## 2016-11-04 NOTE — Telephone Encounter (Signed)
Pt called requesting refill on  Lisinopril, amlodipine,pravastatin to [facility]

## 2016-11-05 ENCOUNTER — Other Ambulatory Visit: Payer: Self-pay

## 2016-11-05 DIAGNOSIS — E7800 Pure hypercholesterolemia, unspecified: Secondary | ICD-10-CM

## 2016-11-05 DIAGNOSIS — I10 Essential (primary) hypertension: Secondary | ICD-10-CM

## 2016-11-05 MED ORDER — LISINOPRIL-HYDROCHLOROTHIAZIDE 20-25 MG PO TABS: 1.0000 | ORAL_TABLET | Freq: Every day | ORAL | 3 refills | Status: DC

## 2016-11-05 MED ORDER — AMLODIPINE BESYLATE 5 MG PO TABS
5.0000 mg | ORAL_TABLET | Freq: Every day | ORAL | 3 refills | Status: DC
Start: 2016-11-05 — End: 2016-11-12

## 2016-11-05 MED ORDER — PRAVASTATIN SODIUM 40 MG PO TABS: 40.0000 mg | ORAL_TABLET | Freq: Every day | ORAL | 3 refills | Status: DC

## 2016-11-12 ENCOUNTER — Other Ambulatory Visit: Payer: Self-pay

## 2016-11-12 DIAGNOSIS — I10 Essential (primary) hypertension: Secondary | ICD-10-CM

## 2016-11-12 DIAGNOSIS — E7800 Pure hypercholesterolemia, unspecified: Secondary | ICD-10-CM

## 2016-11-12 MED ORDER — LISINOPRIL-HYDROCHLOROTHIAZIDE 20-25 MG PO TABS: 1.0000 | ORAL_TABLET | Freq: Every day | ORAL | 3 refills | Status: DC

## 2016-11-12 MED ORDER — PRAVASTATIN SODIUM 40 MG PO TABS: 40.0000 mg | ORAL_TABLET | Freq: Every day | ORAL | 3 refills | Status: DC

## 2016-11-12 MED ORDER — AMLODIPINE BESYLATE 5 MG PO TABS: 5.0000 mg | ORAL_TABLET | Freq: Every day | ORAL | 3 refills | Status: DC

## 2016-11-15 ENCOUNTER — Telehealth: Payer: Self-pay

## 2016-11-15 DIAGNOSIS — L237 Allergic contact dermatitis due to plants, except food: Secondary | ICD-10-CM

## 2016-11-15 MED ORDER — PREDNISONE 20 MG PO TABS: ORAL_TABLET | ORAL | 0 refills | Status: DC

## 2016-11-15 NOTE — Telephone Encounter (Signed)
Please notify patient that given end of day on Friday going into weekend, I can agree to send a rx for Prednisone to his [facility] for the poison ivy. If he is not improving then he needs an office visit, for this problem this would be a one time benefit. I cannot continue to make new rx changes without follow-up appointment. He would need to be seen by Urgent Care or walk in if needed.  Nobie Putnam, [facility] 11/15/2016, 3:41 PM

## 2016-11-15 NOTE — Telephone Encounter (Signed)
Called pt and advised him as per Dr. Raliegh Ip that cannot continue to make new rx changes without follow-up appointment can go to urgent care or walk in this is one time courtesy pt understood very well.

## 2016-11-15 NOTE — Telephone Encounter (Signed)
Patient called reporting he has poison ivy and would like to see if you would call in prednisone.  I told patient he might need appointment for this, but he asked if you would do this without any appointment due to him having no insurance.  Please advise

## 2017-01-13 DIAGNOSIS — R69 Illness, unspecified: Secondary | ICD-10-CM | POA: Diagnosis not present

## 2017-01-17 LAB — COMPREHENSIVE METABOLIC PANEL
ALT: 38 IU/L (ref 0–44)
AST: 27 IU/L (ref 0–40)
Albumin/Globulin Ratio: 1.8 (ref 1.2–2.2)
Albumin: 4.7 g/dL (ref 3.5–5.5)
Alkaline Phosphatase: 86 IU/L (ref 39–117)
BUN/Creatinine Ratio: 12 (ref 9–20)
BUN: 13 mg/dL (ref 6–24)
Bilirubin Total: 0.7 mg/dL (ref 0.0–1.2)
CO2: 25 mmol/L (ref 20–29)
Calcium: 9.8 mg/dL (ref 8.7–10.2)
Chloride: 99 mmol/L (ref 96–106)
Creatinine, Ser: 1.1 mg/dL (ref 0.76–1.27)
GFR calc Af Amer: 87 mL/min/{1.73_m2} (ref >59)
GFR calc non Af Amer: 75 mL/min/{1.73_m2} (ref >59)
Globulin, Total: 2.6 g/dL (ref 1.5–4.5)
Glucose: 118 mg/dL — ABNORMAL HIGH (ref 65–99)
Potassium: 4 mmol/L (ref 3.5–5.2)
Sodium: 140 mmol/L (ref 134–144)
Total Protein: 7.3 g/dL (ref 6.0–8.5)

## 2017-01-17 LAB — HEMOGLOBIN A1C
Est. average glucose Bld gHb Est-mCnc: 114 mg/dL
Hgb A1c MFr Bld: 5.6 % (ref 4.8–5.6)

## 2017-01-20 ENCOUNTER — Encounter: Payer: Self-pay | Admitting: Family Medicine

## 2017-01-20 ENCOUNTER — Ambulatory Visit (INDEPENDENT_AMBULATORY_CARE_PROVIDER_SITE_OTHER): Payer: Self-pay | Admitting: Family Medicine

## 2017-01-20 VITALS — BP 125/72 | HR 82 | Temp 98.7°F | Resp 16 | Ht 68.0 in | Wt 210.0 lb

## 2017-01-20 DIAGNOSIS — N522 Drug-induced erectile dysfunction: Secondary | ICD-10-CM

## 2017-01-20 DIAGNOSIS — Z125 Encounter for screening for malignant neoplasm of prostate: Secondary | ICD-10-CM

## 2017-01-20 DIAGNOSIS — E559 Vitamin D deficiency, unspecified: Secondary | ICD-10-CM

## 2017-01-20 DIAGNOSIS — E782 Mixed hyperlipidemia: Secondary | ICD-10-CM

## 2017-01-20 DIAGNOSIS — R7989 Other specified abnormal findings of blood chemistry: Secondary | ICD-10-CM

## 2017-01-20 DIAGNOSIS — M19072 Primary osteoarthritis, left ankle and foot: Secondary | ICD-10-CM

## 2017-01-20 DIAGNOSIS — Z1211 Encounter for screening for malignant neoplasm of colon: Secondary | ICD-10-CM

## 2017-01-20 DIAGNOSIS — Z79899 Other long term (current) drug therapy: Secondary | ICD-10-CM

## 2017-01-20 DIAGNOSIS — F4310 Post-traumatic stress disorder, unspecified: Secondary | ICD-10-CM

## 2017-01-20 DIAGNOSIS — Z0000 Encounter for general adult medical examination without abnormal findings: Secondary | ICD-10-CM

## 2017-01-20 DIAGNOSIS — F339 Major depressive disorder, recurrent, unspecified: Secondary | ICD-10-CM

## 2017-01-20 DIAGNOSIS — I10 Essential (primary) hypertension: Secondary | ICD-10-CM

## 2017-01-20 DIAGNOSIS — G5603 Carpal tunnel syndrome, bilateral upper limbs: Secondary | ICD-10-CM

## 2017-01-20 DIAGNOSIS — R7303 Prediabetes: Secondary | ICD-10-CM

## 2017-01-20 MED ORDER — SILDENAFIL CITRATE 20 MG PO TABS: ORAL_TABLET | ORAL | 3 refills | Status: DC

## 2017-01-20 NOTE — Progress Notes (Signed)
Subjective:    Patient ID: Jorge Jordan, male    DOB: Aug 12, 1960, 56 y.o.   MRN: [id number]  Jorge Jordan is a 56 y.o. male presenting on 01/20/2017 for Annual Exam   HPI   Here for Annual Physical and Lab Review.  Specialists Psychiatry - [facility], [facility] - Dr Earnestine Leys ([facility]) Podiatry - Dr Tyson Dense ([place]) Dermatology - Dr Aubery Lapping [facility])  Pre-Diabetes / Obesity BMI >31 - Last visit with me 07/2016, for same problem with previously new dx Pre-DM A1c 5.9, treated with lifestyle modification and counseling, agreed to hold off on metformin, see prior notes for background information. - Interval update with improved diet and lifestyle - Today patient reports pleased with A1c 5.6 Meds: None (never on meds) Currently on ACEi Lifestyle: - Diet (Improved DM diet now, reduce portion, less sugars, drinks more water, only 1 diet pepsi in AM for caffeine now)  - Exercise (recent increased walking, now joined gym to use indoor treadmill cardio machines since difficulty with outdoor temp too hot or too cold) - Family history of DM  CHRONIC HTN: Reports checks BP at home now avg seems 110/65, seems improved more from previous with lifestyle changes Current Meds - Lisinopril-HCTZ 20-25mg  daily, Amlodipine 5mg  daily Reports good compliance, took meds today. Tolerating well, w/o complaints.  Elevated Creatinine / Presumed CKD-II - Reviewed prior labs with range 1.1 to 1.24 Cr - Today recent Cr improved to 1.10, reassuring, improved water intake  HYPERLIPIDEMIA / ELEVATED LFTs / H/o Alcohol Abuse - Prior elevated LFTs from chart review both AST and ALT (higher) only mild elevated, in past discussion was if this was due to statin vs alcohol - Today again labs show normal LFTs, reassuring again off alcohol since 01/2016, unlikely to be due to statin, now on for > 6 months and tolerating well -  Currently taking Pravastatin 40mg , tolerating well without side effects or myalgias - Gained few lbs from previous - taking ASA 81  MAJOR DEPRESSION + Anxiety, PTSD - Followed by Psychiatry and counseling therapy, now at [facility]) previous care received at [facility]. His meds have been adjusted now doing very well on Trintellix and Buspirone, he was transitioned off Ativan several months ago. Now has new provider x 3 months. Next apt in 1-2 weeks, 01/2017 - States that previous diagnosis of bipolar disorder was not accurate  CARPAL TUNNEL SYNDROME, Bilateral L > R - Followed by [facility] - Dr Sabra Heck, last seen 09/2016, has had oral prednisone and injection in past with some relief, but still has problem with carpal tunnel symptoms recurrent, he is interested in surgery in future if could be covered, he is waiting disability. 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 Chronic pain. Last seen podiatry 09/2015, [facility].  Additional social history: - He is awaiting a disability hearing on 04/16/17 due to mental health diagnoses with Major Depression Recurrent and Anxiety complicated by PTSD. Currently no insurance coverage  Health Maintenance: - Already received Flu Shot recently through [facility], will fax [place] information when available - Prostate CA Screening: Prior PSA / DRE  reported normal. Last PSA 1.0 (07/2015). Currently asymptomatic without endorsing any BPH LUTS or nocturia. No known family history of prostate CA.  - Colon CA Screening: Never had colonoscopy. He has  had home FOBT testing through [facility] that was reportedly negative in 2018, he can get results for [place]. Currently asymptomatic. No known family history of colon CA. Due for screening test yearly FOBT next year pending result and could benefit from colonoscopy vs cologuard in future - Declines routine HIV Screening  Depression screen [facility] 2/9 01/20/2017  Decreased Interest 2  Down, Depressed, Hopeless 2  PHQ - 2 Score 4  Altered sleeping 1  Tired, decreased energy 3  Change in appetite 3  Feeling bad or failure about yourself  2  Trouble concentrating 2  Moving slowly or fidgety/restless 0  Suicidal thoughts 1  PHQ-9 Score 16  Difficult doing work/chores Very difficult   GAD 7 : Generalized Anxiety Score 05/30/2015  Nervous, Anxious, on Edge 3  Control/stop worrying 3  Worry too much - different things 3  Trouble relaxing 3  Restless 3  Easily annoyed or irritable 3  Afraid - awful might happen 3  Total GAD 7 Score 21  Anxiety Difficulty Somewhat difficult    Past Medical History:  Diagnosis Date  . Allergy   . Anxiety   . Arthritis   . Depression   . Foot deformity    foot arthrodesis L foot  . Hyperlipidemia   . Hypertension   . Osteoporosis   . Shoulder bursitis    both   . Tendinitis    elbow   Past Surgical History:  Procedure Laterality Date  . FOOT SURGERY Left 2001   Triple arthrodesis  . ROTATOR CUFF REPAIR Left   . SHOULDER FUSION SURGERY    . WRIST SURGERY     ganglion cyst   Social History   Social History  . Marital status: Married    Spouse name: N/A  . Number of children: N/A  . Years of education: N/A   Occupational History  . Not on file.   Social History Main Topics  . Smoking status: Never Smoker  . Smokeless tobacco: Never Used  . Alcohol use No     Comment: Abstaining from alcohol since 01/19/16  . Drug use: No  . Sexual activity: Not on file   Other Topics Concern  . Not on file   Social History Narrative  . No narrative  on file   Family History  Problem Relation Age of Onset  . Hyperlipidemia Mother   . Hypertension Mother   . Diabetes Mother   . Depression Mother   . Hypertension Father   . Hyperlipidemia Father   . Cancer Father        skin cancer  . Hypertension Brother   . Diabetes Maternal Grandmother   . Heart attack Maternal Grandfather    Current Outpatient Prescriptions on File Prior to Visit  Medication Sig  . amLODipine (NORVASC) 5 MG tablet Take 1 tablet (5 mg total) by mouth daily.  Marland Kitchen lisinopril-hydrochlorothiazide (PRINZIDE,ZESTORETIC) 20-25 MG tablet Take 1 tablet by mouth daily.  . pravastatin (PRAVACHOL) 40 MG tablet Take 1 tablet (40 mg total) by mouth daily.   No current facility-administered medications on file prior to visit.     Review of Systems  Constitutional: Negative for activity change, appetite change, chills, diaphoresis, fatigue and fever.  HENT: Negative for congestion, hearing loss and sinus pressure.   Eyes: Negative for visual  disturbance.  Respiratory: Negative for apnea, cough, choking, chest tightness, shortness of breath and wheezing.        Snoring  Cardiovascular: Negative for chest pain, palpitations and leg swelling.  Gastrointestinal: Negative for abdominal distention, abdominal pain, anal bleeding, blood in stool, constipation, diarrhea, nausea and vomiting.  Endocrine: Negative for cold intolerance and polyuria.  Genitourinary: Negative for decreased urine volume, difficulty urinating, dysuria, frequency, hematuria, scrotal swelling, testicular pain and urgency.  Musculoskeletal: Positive for arthralgias (Left foot). Negative for neck pain.  Skin: Negative for rash.  Allergic/Immunologic: Negative for environmental allergies.  Neurological: Negative for dizziness, weakness, light-headedness, numbness and headaches.  Hematological: Negative for adenopathy.  Psychiatric/Behavioral: Positive for dysphoric mood. Negative for agitation, behavioral  problems, self-injury, sleep disturbance and suicidal ideas. The patient is nervous/anxious.    Per HPI unless specifically indicated above     Objective:    BP 125/72   Pulse 82   Temp 98.7 F (37.1 C) (Oral)   Resp 16   Ht 5\' 8"  (1.727 m)   Wt 210 lb (95.3 kg)   BMI 31.93 kg/m   Wt Readings from Last 3 Encounters:  01/20/17 210 lb (95.3 kg)  07/10/16 198 lb (89.8 kg)  04/11/16 199 lb 9.6 oz (90.5 kg)    Physical Exam  Constitutional: He is oriented to person, place, and time. He appears well-developed and well-nourished. No distress.  Well-appearing, comfortable, cooperative  HENT:  Head: Normocephalic and atraumatic.  Mouth/Throat: Oropharynx is clear and moist.  Frontal / maxillary sinuses non-tender. Nares patent without purulence or edema. Bilateral TMs clear without erythema, effusion or bulging. Oropharynx clear without erythema, exudates, edema or asymmetry.  Mallampati Score 3 - Visualization of only base of uvula  Eyes: Pupils are equal, round, and reactive to light. Conjunctivae and EOM are normal. Right eye exhibits no discharge. Left eye exhibits no discharge.  Neck: Normal range of motion. Neck supple. No thyromegaly present.  No carotid bruits  Cardiovascular: Normal rate, regular rhythm, normal heart sounds and intact distal pulses.   No murmur heard. Pulmonary/Chest: Effort normal and breath sounds normal. No respiratory distress. He has no wheezes. He has no rales.  Abdominal: Soft. Bowel sounds are normal. He exhibits no distension and no mass. There is no tenderness.  Genitourinary:  Genitourinary Comments: Deferred DRE  Musculoskeletal: Normal range of motion. He exhibits no edema or tenderness.  Upper / Lower Extremities: - Normal muscle tone, strength bilateral upper extremities 5/5, lower extremities 5/5  Lymphadenopathy:    He has no cervical adenopathy.  Neurological: He is alert and oriented to person, place, and time.  Distal sensation intact  to light touch all extremities  Skin: Skin is warm and dry. No rash noted. He is not diaphoretic. No erythema.  Psychiatric: He has a normal mood and affect. His behavior is normal.  Well groomed, good eye contact, normal speech and thoughts  Nursing note and vitals reviewed.  Results for orders placed or performed in visit on 07/10/16  Comprehensive metabolic panel  Result Value Ref Range   Glucose 118 (H) 65 - 99 mg/dL   BUN 13 6 - 24 mg/dL   Creatinine, Ser 1.10 0.76 - 1.27 mg/dL   GFR calc non Af Amer 75 >59 mL/min/1.73   GFR calc Af Amer 87 >59 mL/min/1.73   BUN/Creatinine Ratio 12 9 - 20   Sodium 140 134 - 144 mmol/L   Potassium 4.0 3.5 - 5.2 mmol/L   Chloride 99 96 - 106  mmol/L   CO2 25 20 - 29 mmol/L   Calcium 9.8 8.7 - 10.2 mg/dL   Total Protein 7.3 6.0 - 8.5 g/dL   Albumin 4.7 3.5 - 5.5 g/dL   Globulin, Total 2.6 1.5 - 4.5 g/dL   Albumin/Globulin Ratio 1.8 1.2 - 2.2   Bilirubin Total 0.7 0.0 - 1.2 mg/dL   Alkaline Phosphatase 86 39 - 117 IU/L   AST 27 0 - 40 IU/L   ALT 38 0 - 44 IU/L  Hemoglobin A1c  Result Value Ref Range   Hgb A1c MFr Bld 5.6 4.8 - 5.6 %   Est. average glucose Bld gHb Est-mCnc 114 mg/dL      Assessment & Plan:   Problem List Items Addressed This Visit    Bilateral carpal tunnel syndrome    Stable, unchanged Follow-up with ortho as planned in future 2019 to re-consider surgical options      Relevant Medications   traZODone (DESYREL) 100 MG tablet   busPIRone (BUSPAR) 10 MG tablet   vortioxetine HBr (TRINTELLIX) 10 MG TABS   Elevated serum creatinine    Improved to stable Cr now 1.1 Presumed CKD-II Chronic HTN controlled, Pre-DM - Remains off NSAIDS, off alcohol 01/2016  Plan: 1. Reassurance, continue improved hydration 2. Control BP and sugar 3. Follow-up labs 6 month for next annual phys then can do yearly Cr trend      Relevant Orders   Comprehensive metabolic panel   Essential hypertension    Remains very well controlled  HTN Possible CKD-II, mild improved  Plan:  1. Continue current Lisinopril-HCTZ 20-25mg  daily, Amlodipine 5mg  - discussed possibility of reducing med removing one of three BP meds in future if remains well control and if more exercise / wt loss 2. Lifestyle Mods, encouraged continue diet, advised low salt and improve regular cardiovascular exercise at gym 3. Continue to monitor BP at home 4. Follow-up 6 months Annual Physical + labs      Relevant Medications   aspirin EC 81 MG tablet   sildenafil (REVATIO) 20 MG tablet   Other Relevant Orders   Comprehensive metabolic panel   CBC with Differential/Platelet   Hyperlipidemia    Previous lipids controlled back on statin Last lipid panel 06/2016 Calculated ASCVD 10 yr risk score elevated No myalgia and no abnormal LFTs, now that he remains off Alcohol  Plan: 1. Continue current meds - Pravastatin 40mg  daily 2. Continue ASA 81mg  for primary ASCVD risk reduction 3. Encourage improved lifestyle - low carb/cholesterol, reduce portion size, continue improving regular exercise 4. Follow-up 6 months for annual + labs, re-check lipids      Relevant Medications   aspirin EC 81 MG tablet   sildenafil (REVATIO) 20 MG tablet   Other Relevant Orders   Lipid panel   Major depressive disorder, recurrent episode with anxious distress (HCC)    Stable, chronic problem followed by Psychiatry, now at [facility] x 3 months Complicated by anxiety and PTSD Continues on Trintellix, Buspirone, off BDZ      Relevant Medications   traZODone (DESYREL) 100 MG tablet   busPIRone (BUSPAR) 10 MG tablet   vortioxetine HBr (TRINTELLIX) 10 MG TABS   Osteoarthritis of left ankle and foot    Stable chronic problem 2/2 acquired deformity and required surgery / developed secondary OA/DJD Limiting his function Followed by Ortho/Podiatry      Relevant Medications   aspirin EC 81 MG tablet   Pre-diabetes    Well-controlled Pre-DM with A1c improved  to  5.6, from prior 5.9 Concern with obesity, HTN, HLD  Plan:  1. Not on any therapy currently - no indication to start meds at this time 2. Encourage improved lifestyle - low carb, low sugar diet, reduce portion size, continue improving regular exercise 3. Follow-up 6 months annual + labs, can start checking 6-12 mo in future      Relevant Orders   Hemoglobin A1c   PTSD (post-traumatic stress disorder)    Stable, chronic problem followed by Psychiatry Complicated by MDD + Anxiety Continues on Trintellix, Buspirone, off BDZ      Relevant Medications   traZODone (DESYREL) 100 MG tablet   busPIRone (BUSPAR) 10 MG tablet   vortioxetine HBr (TRINTELLIX) 10 MG TABS   Screening for colon cancer    Last screening FOBT through [place] in 2018, do not have results, reportedly negative Never had colonoscopy (not interested), no family history colon cancer. - Discussion today about recommendations for either Colonoscopy or Cologuard screening, benefits and risks of screening, he will re-consider in 2019 pending insurance/coverage      Screening for prostate cancer    Avg / Low risk patient, with prior reported negative screening - Last PSA 1.0 (07/2015), prior values normal - Clinically asymptomatic  Plan: 1. Reviewed prostate cancer screening guidelines and risks including potential prostate biopsy if abnormal PSA, proceed with yearly PSA for now, and anticipate DRE as needed especially if abnormal PSA or new symptoms      Relevant Orders   PSA   Vitamin D deficiency    Previous low Vit D < 20 Still taking Vitamin D3 1,000 iu daily Check future Vit D with upcoming labs 6 mo to see if improved, may need replacement therapy in future      Relevant Orders   VITAMIN D 25 Hydroxy (Vit-D Deficiency, Fractures)    Other Visit Diagnoses    Annual physical exam    -  Primary   Relevant Orders   Comprehensive metabolic panel   Lipid panel   CBC with Differential/Platelet   Hemoglobin A1c    Drug-induced erectile dysfunction       Relevant Medications   sildenafil (REVATIO) 20 MG tablet   Long-term use of high-risk medication       Relevant Orders   CBC with Differential/Platelet      Meds ordered this encounter  Medications  . traZODone (DESYREL) 100 MG tablet    Sig: trazodone 100 mg tablet  TK 1 T PO HS UP TO 2 TS PRF INSOMNIA  . DISCONTD: vortioxetine HBr (TRINTELLIX) 20 MG TABS    Sig: Trintellix 20 mg tablet  . aspirin EC 81 MG tablet    Sig: Take 81 mg by mouth daily.  . busPIRone (BUSPAR) 10 MG tablet    Sig: Take 10 mg by mouth 2 (two) times daily.  Marland Kitchen vortioxetine HBr (TRINTELLIX) 10 MG TABS    Sig: Take 10 mg by mouth daily.  . sildenafil (REVATIO) 20 MG tablet    Sig: Take 3-5 tablets as needed 30 prior to sex.    Dispense:  30 tablet    Refill:  3    Follow up plan: Return in about 6 months (around 07/21/2017) for Annual Physical.  Future labs printed for [facility] today given to patient, for him to get drawn before next apt in 6 months.  Nobie Putnam, DO [facility] 01/20/2017, 12:38 PM

## 2017-01-20 NOTE — Assessment & Plan Note (Signed)
Last screening FOBT through [place] in 2018, do not have results, reportedly negative Never had colonoscopy (not interested), no family history colon cancer. - Discussion today about recommendations for either Colonoscopy or Cologuard screening, benefits and risks of screening, he will re-consider in 2019 pending insurance/coverage

## 2017-01-20 NOTE — Assessment & Plan Note (Signed)
Remains very well controlled HTN Possible CKD-II, mild improved  Plan:  1. Continue current Lisinopril-HCTZ 20-25mg  daily, Amlodipine 5mg  - discussed possibility of reducing med removing one of three BP meds in future if remains well control and if more exercise / wt loss 2. Lifestyle Mods, encouraged continue diet, advised low salt and improve regular cardiovascular exercise at gym 3. Continue to monitor BP at home 4. Follow-up 6 months Annual Physical + labs

## 2017-01-20 NOTE — Assessment & Plan Note (Signed)
Previous low Vit D < 20 Still taking Vitamin D3 1,000 iu daily Check future Vit D with upcoming labs 6 mo to see if improved, may need replacement therapy in future

## 2017-01-20 NOTE — Patient Instructions (Addendum)
Thank you for coming to the clinic today.  1. Keep up the great work!  In Future if BP remains well controlled, regular exercise and some weight loss we can consider reducing BP  Medication  We may be able to do yearly annual physicals starting next year if all is well  Reminder to send copy of Flu Shot date/information ----------------------------  Recommend double checking with your Dermatologist about the capillaries or blood vessels on face, may consider options for resolving  ----------------------------------  Check into getting result and record for the Colon Cancer Screening (Fecal Occult Blood Test) FOBT - we can enter in system and update your chart. - Also clarify if this is a FOBT for blood or if it is a Biochemical or DNA test  - For future, consider Cologuard or Colonoscopy  Colon Cancer Screening: - For all adults age 56+ routine colon cancer screening is highly recommended.     - Recent guidelines from [employer] recommend starting age of 56 - Early detection of colon cancer is important, because often there are no warning signs or symptoms, also if found early usually it can be cured. Late stage is hard to treat.  - If you are not interested in Colonoscopy screening (if done and normal you could be cleared for 5 to 10 years until next due), then Cologuard is an excellent alternative for screening test for Colon Cancer. It is highly sensitive for detecting DNA of colon cancer from even the earliest stages. Also, there is NO bowel prep required. - If Cologuard is NEGATIVE, then it is good for 3 years before next due - If Cologuard is POSITIVE, then it is strongly advised to get a Colonoscopy, which allows the GI doctor to locate the source of the cancer or polyp (even very early stage) and treat it by removing it. ------------------------- If you would like to proceed with Cologuard (stool DNA test) - FIRST, call your insurance company and tell them you want  to check cost of Cologuard tell them CPT Code [phone] (it may be completely covered and you could get for no cost, OR max cost without any coverage is about $600). Also, keep in mind if you do NOT open the kit, and decide not to do the test, you will NOT be charged, you should contact the company if you decide not to do the test. - If you want to proceed, you can notify us (phone message, [place], or at next visit) and we will order it for you. The test kit will be delivered to you house within about 1 week. Follow instructions to collect sample, you may call the company for any help or questions, 24/7 telephone support at [phone].  ---------------------------------------------------------------------------  DUE for FASTING BLOOD WORK (no food or drink after midnight before the lab appointment, only water or coffee without cream/sugar on the morning of)  [facility] 6 MONTHS  For Lab Results, once available within 2-3 days of blood draw, you can can log in to MyChart online to view your results and a brief explanation. Also, we can discuss results at next follow-up visit.  Please schedule a Follow-up Appointment to: Return in about 6 months (around 07/21/2017) for Annual Physical.  If you have any other questions or concerns, please feel free to call the clinic or send a message through [place]. You may also schedule an earlier appointment if necessary.  Additionally, you may be receiving a survey about your experience at our clinic within a few  days to 1 week by e-mail or mail. We value your feedback.  Nobie Putnam, DO [place]

## 2017-01-20 NOTE — Assessment & Plan Note (Signed)
Well-controlled Pre-DM with A1c improved to 5.6, from prior 5.9 Concern with obesity, HTN, HLD  Plan:  1. Not on any therapy currently - no indication to start meds at this time 2. Encourage improved lifestyle - low carb, low sugar diet, reduce portion size, continue improving regular exercise 3. Follow-up 6 months annual + labs, can start checking 6-12 mo in future

## 2017-01-20 NOTE — Assessment & Plan Note (Signed)
Stable, chronic problem followed by Psychiatry, now at [facility] x 3 months Complicated by anxiety and PTSD Continues on Trintellix, Buspirone, off BDZ

## 2017-01-20 NOTE — Assessment & Plan Note (Signed)
Avg / Low risk patient, with prior reported negative screening - Last PSA 1.0 (07/2015), prior values normal - Clinically asymptomatic  Plan: 1. Reviewed prostate cancer screening guidelines and risks including potential prostate biopsy if abnormal PSA, proceed with yearly PSA for now, and anticipate DRE as needed especially if abnormal PSA or new symptoms

## 2017-01-20 NOTE — Assessment & Plan Note (Signed)
Stable, unchanged Follow-up with ortho as planned in future 2019 to re-consider surgical options

## 2017-01-20 NOTE — Assessment & Plan Note (Signed)
Stable, chronic problem followed by Psychiatry Complicated by MDD + Anxiety Continues on Trintellix, Buspirone, off BDZ

## 2017-01-20 NOTE — Assessment & Plan Note (Signed)
Stable chronic problem 2/2 acquired deformity and required surgery / developed secondary OA/DJD Limiting his function Followed by Ortho/Podiatry

## 2017-01-20 NOTE — Assessment & Plan Note (Signed)
Previous lipids controlled back on statin Last lipid panel 06/2016 Calculated ASCVD 10 yr risk score elevated No myalgia and no abnormal LFTs, now that he remains off Alcohol  Plan: 1. Continue current meds - Pravastatin 40mg  daily 2. Continue ASA 81mg  for primary ASCVD risk reduction 3. Encourage improved lifestyle - low carb/cholesterol, reduce portion size, continue improving regular exercise 4. Follow-up 6 months for annual + labs, re-check lipids

## 2017-01-20 NOTE — Assessment & Plan Note (Signed)
Improved to stable Cr now 1.1 Presumed CKD-II Chronic HTN controlled, Pre-DM - Remains off NSAIDS, off alcohol 01/2016  Plan: 1. Reassurance, continue improved hydration 2. Control BP and sugar 3. Follow-up labs 6 month for next annual phys then can do yearly Cr trend

## 2017-01-21 DIAGNOSIS — R69 Illness, unspecified: Secondary | ICD-10-CM | POA: Diagnosis not present

## 2017-04-21 ENCOUNTER — Encounter: Payer: Self-pay | Admitting: Family Medicine

## 2017-04-21 ENCOUNTER — Ambulatory Visit (INDEPENDENT_AMBULATORY_CARE_PROVIDER_SITE_OTHER): Payer: Managed Care, Other (non HMO) | Admitting: Family Medicine

## 2017-04-21 VITALS — BP 116/75 | HR 95 | Temp 98.9°F | Resp 16 | Ht 68.0 in | Wt 215.0 lb

## 2017-04-21 DIAGNOSIS — R42 Dizziness and giddiness: Secondary | ICD-10-CM | POA: Diagnosis not present

## 2017-04-21 DIAGNOSIS — R7303 Prediabetes: Secondary | ICD-10-CM | POA: Diagnosis not present

## 2017-04-21 DIAGNOSIS — E559 Vitamin D deficiency, unspecified: Secondary | ICD-10-CM

## 2017-04-21 DIAGNOSIS — I10 Essential (primary) hypertension: Secondary | ICD-10-CM | POA: Diagnosis not present

## 2017-04-21 DIAGNOSIS — R0989 Other specified symptoms and signs involving the circulatory and respiratory systems: Secondary | ICD-10-CM

## 2017-04-21 DIAGNOSIS — E538 Deficiency of other specified B group vitamins: Secondary | ICD-10-CM

## 2017-04-21 NOTE — Assessment & Plan Note (Signed)
Stable controlled HTN, concern initially about possible low BP with symptoms dizziness No change to anti HTN meds at this time Improve hydration Considered temporary hold Lisinopril-HCTZ for few days see if symptoms improve, in future goal to reduce HCTZ and continue ACEi

## 2017-04-21 NOTE — Assessment & Plan Note (Addendum)
Check sugar with fasting BMET tomorrow

## 2017-04-21 NOTE — Assessment & Plan Note (Signed)
Prior low Vit D Planned to check upcoming physical, now will check sooner with labs as requested

## 2017-04-21 NOTE — Progress Notes (Signed)
Subjective:    Patient ID: Jorge Jordan, male    DOB: March 18, 1961, 57 y.o.   MRN: [id number]  Jorge Jordan is a 57 y.o. male presenting on 04/21/2017 for Dizziness (onset 3 days but today chest congestion and cough )  Patient presents for a same day appointment.  HPI   DIZZINESS / Chest Congestion Reports onset symptoms woke up and started since Friday felt dizziness without other associated symptoms, persistent through weekend Sat/Sun. He clarifies dizziness with feeling somewhat lightheaded without any room spinning or vertigo, seems to be persistent without worsening, not changed by exertion, ambulation or other activities, even at rest seems persistent. Now today still has same dizziness symptom, had new symptoms with waking up having chest congestion and cough. - He has not tried any OTC medicines for dizziness or cold symptoms - No known sick contacts recently - He has been monitoring BP regularly over past weekend, highest 140/90, otherwise would improve soon after, and avg readings over past weekend were 120/70-80 - Admits non productive cough this morning onset, felt "phlegm" or "rattling in chest" but unable to cough it up - Denies chest pain or pressure/tightness, dyspnea, loss of vision, headache, vertigo, numbness, weakness tingling, fevers/chills, sinus congestion or pressure  Other update - only medicine change from his Psychiatry, Buspirone was increased to TID instead of BID, in Oct/Nov 2018, he has done well on this change, and no new side effects or problems with it at that time.  Health Maintenance: UTD Flu 02/05/17  Depression screen PHQ 2/9 01/20/2017  Decreased Interest 2  Down, Depressed, Hopeless 2  PHQ - 2 Score 4  Altered sleeping 1  Tired, decreased energy 3  Change in appetite 3  Feeling bad or failure about yourself  2  Trouble concentrating 2  Moving slowly or fidgety/restless 0  Suicidal thoughts 1  PHQ-9 Score 16  Difficult doing work/chores  Very difficult    Social History   Tobacco Use  . Smoking status: Never Smoker  . Smokeless tobacco: Never Used  Substance Use Topics  . Alcohol use: No    Comment: Abstaining from alcohol since 01/19/16  . Drug use: No    Review of Systems Per HPI unless specifically indicated above     Objective:    BP 116/75   Pulse 95   Temp 98.9 F (37.2 C) (Oral)   Resp 16   Ht 5\' 8"  (1.727 m)   Wt 215 lb (97.5 kg)   SpO2 99%   BMI 32.69 kg/m   Wt Readings from Last 3 Encounters:  04/21/17 215 lb (97.5 kg)  01/20/17 210 lb (95.3 kg)  07/10/16 198 lb (89.8 kg)    Physical Exam  Constitutional: He is oriented to person, place, and time. He appears well-developed and well-nourished. No distress.  Mostly well appearing, comfortable, cooperative  HENT:  Head: Normocephalic and atraumatic.  Frontal / maxillary sinuses non-tender. Nares patent without purulence or edema. Bilateral TMs clear without erythema, effusion or bulging. Oropharynx clear without erythema, exudates, edema or asymmetry.  mild dry mucus mem  Dix-Hallpike maneuver positive bilateral, with reproduced dizziness and some vertigo predictably 30 sec in after supine, bilateral with improvement upon sitting, no obvious nystagmus  Eyes: Conjunctivae are normal. Right eye exhibits no discharge. Left eye exhibits no discharge.  Neck: Normal range of motion. Neck supple.  Cardiovascular: Normal rate, regular rhythm, normal heart sounds and intact distal pulses.  No murmur heard. Pulmonary/Chest: Effort normal and breath sounds  normal. No respiratory distress. He has no wheezes. He has no rales.  Minimally reduced air movement bilateral bases, some slight coarse sounds seem improved with further breathing. Not focal crackle or wheezing  Musculoskeletal: Normal range of motion. He exhibits no edema.  Lymphadenopathy:    He has no cervical adenopathy.  Neurological: He is alert and oriented to person, place, and time. No  cranial nerve deficit. Coordination normal.  Distal sensation to light touch intact, symmetrical exam, cerebellar testing finger to nose and heel to shin intact, normal  Skin: Skin is warm and dry. No rash noted. He is not diaphoretic. No erythema.  Psychiatric: He has a normal mood and affect. His behavior is normal.  Well groomed, good eye contact, normal speech and thoughts  Nursing note and vitals reviewed.  Results for orders placed or performed in visit on 07/10/16  Comprehensive metabolic panel  Result Value Ref Range   Glucose 118 (H) 65 - 99 mg/dL   BUN 13 6 - 24 mg/dL   Creatinine, Ser 1.10 0.76 - 1.27 mg/dL   GFR calc non Af Amer 75 >59 mL/min/1.73   GFR calc Af Amer 87 >59 mL/min/1.73   BUN/Creatinine Ratio 12 9 - 20   Sodium 140 134 - 144 mmol/L   Potassium 4.0 3.5 - 5.2 mmol/L   Chloride 99 96 - 106 mmol/L   CO2 25 20 - 29 mmol/L   Calcium 9.8 8.7 - 10.2 mg/dL   Total Protein 7.3 6.0 - 8.5 g/dL   Albumin 4.7 3.5 - 5.5 g/dL   Globulin, Total 2.6 1.5 - 4.5 g/dL   Albumin/Globulin Ratio 1.8 1.2 - 2.2   Bilirubin Total 0.7 0.0 - 1.2 mg/dL   Alkaline Phosphatase 86 39 - 117 IU/L   AST 27 0 - 40 IU/L   ALT 38 0 - 44 IU/L  Hemoglobin A1c  Result Value Ref Range   Hgb A1c MFr Bld 5.6 4.8 - 5.6 %   Est. average glucose Bld gHb Est-mCnc 114 mg/dL      Assessment & Plan:   Problem List Items Addressed This Visit    Essential hypertension    Stable controlled HTN, concern initially about possible low BP with symptoms dizziness No change to anti HTN meds at this time Improve hydration Considered temporary hold Lisinopril-HCTZ for few days see if symptoms improve, in future goal to reduce HCTZ and continue ACEi      Relevant Orders   Basic Metabolic Panel (BMET)   Pre-diabetes    Check sugar with fasting BMET tomorrow Will check A1c in 3 months with physical      Vitamin D deficiency    Prior low Vit D Planned to check upcoming physical, now will check sooner  with labs as requested      Relevant Orders   VITAMIN D 25 Hydroxy (Vit-D Deficiency, Fractures)    Other Visit Diagnoses    Dizziness    -  Primary  Suspected some component of BPPV by history (however not classic story, seems more persistent not episodic, not endorsing room spinning until actually provoked on exam), now better supported by positive bilateral Dix-Hallpike - No other significant neurological findings or focal deficits - Differential is broad - see other A&P, BP is stable, may try hold diuretic if hydration is limited, also consider some vitamin deficiencies requested by patient to check labs. Lastly consider viral etiology now with more acute chest congestion symptoms.  Plan: 1. Handout given with Epley maneuver TID  for 1-2 weeks until resolved 2. Rx meclizine PRN for breakthrough symptoms 3. Return criteria, if not improved consider vestibular PT referral    Relevant Orders   CBC with Differential/Platelet   Vitamin B12 deficiency     History of B12 deficiency No longer on supplement Question if affecting current symptoms, seems less likely, will check with labs regardless    Relevant Orders   Vitamin B12   Chest congestion    Clinically suspect possible viral URI vs early bronchitis No significant focal lung abnormality on exam Considered CXR, however defer for now since now have more likely diagnosis with BPPV on exam Supportive care, may try Mucinex OTC Follow-up if not improving in few days after Epley, adjust HTN meds, hydration, next if worsening respiratory symptoms consider CXR        No orders of the defined types were placed in this encounter.     Follow up plan: Return in about 1 week (around 04/28/2017), or if symptoms worsen or fail to improve, for dizziness, chest congestion.  Printed [facility] orders, he will go tomorrow for labs.  Nobie Putnam, [facility] 04/21/2017, 11:22 PM

## 2017-04-21 NOTE — Patient Instructions (Addendum)
Thank you for coming to the office today.  1. I do not have a clear cause for your dizziness. It most likely is related to chest congestion viral syndrome that is developing or possibly could be an early bronchitis or pneumonia that is starting as well  Return or contact us if worsening respiratory symptoms, we may reconsider Chest x-ray  Possibly if not improving and no other respiratory illness symptoms, I would then recommend to [place] for 2-3 days and see if you feel any better.  Try to stay very well hydrated during this time.  Lab orders for [facility] tomorrow - keep apt in April 2019 for other labs  ------  Alternatively if your symptoms change to more room spinning or vertigo related to dizziness, worse typically with quick head movements or turning head quickly. You have symptoms of Vertigo (Benign Paroxysmal Positional Vertigo) - This is commonly caused by inner ear fluid imbalance, sometimes can be worsened by allergies and sinus symptoms, otherwise it can occur randomly sometimes and we may never discover the exact cause. - To treat this, try the Epley Manuever (see diagrams/instructions below) at home up to 3 times a day for 1-2 weeks or until symptoms resolve - You may take Meclizine as needed up to 3 times a day for dizziness, this will not cure symptoms but may help. Caution may make you drowsy.  If you develop significant worsening episode with vertigo that does not improve and you get severe headache, loss of vision, arm or leg weakness, slurred speech, or other concerning symptoms please seek immediate medical attention at Emergency Department.  See the next page for images describing the Epley Manuever.     ----------------------------------------------------------------------------------------------------------------------       Please schedule a Follow-up Appointment to: Return in about 1 week (around 04/28/2017), or if symptoms worsen or fail to  improve, for dizziness, chest congestion.    If you have any other questions or concerns, please feel free to call the office or send a message through [place]. You may also schedule an earlier appointment if necessary.  Additionally, you may be receiving a survey about your experience at our office within a few days to 1 week by e-mail or mail. We value your feedback.  Nobie Putnam, DO [place]

## 2017-04-23 LAB — VITAMIN D 25 HYDROXY (VIT D DEFICIENCY, FRACTURES): Vit D, 25-Hydroxy: 13.8 ng/mL — ABNORMAL LOW (ref 30.0–100.0)

## 2017-04-23 LAB — CBC WITH DIFFERENTIAL/PLATELET
Basophils Absolute: 0 10*3/uL (ref 0.0–0.2)
Basos: 0 %
EOS (ABSOLUTE): 0.3 10*3/uL (ref 0.0–0.4)
Eos: 5 %
Hematocrit: 46.2 % (ref 37.5–51.0)
Hemoglobin: 16.3 g/dL (ref 13.0–17.7)
Immature Grans (Abs): 0 10*3/uL (ref 0.0–0.1)
Immature Granulocytes: 0 %
Lymphocytes Absolute: 1.4 10*3/uL (ref 0.7–3.1)
Lymphs: 26 %
MCH: 34.2 pg — ABNORMAL HIGH (ref 26.6–33.0)
MCHC: 35.3 g/dL (ref 31.5–35.7)
MCV: 97 fL (ref 79–97)
Monocytes Absolute: 0.7 10*3/uL (ref 0.1–0.9)
Monocytes: 12 %
Neutrophils Absolute: 3.2 10*3/uL (ref 1.4–7.0)
Neutrophils: 57 %
Platelets: 241 10*3/uL (ref 150–379)
RBC: 4.76 x10E6/uL (ref 4.14–5.80)
RDW: 13.1 % (ref 12.3–15.4)
WBC: 5.5 10*3/uL (ref 3.4–10.8)

## 2017-04-23 LAB — BASIC METABOLIC PANEL
BUN/Creatinine Ratio: 11 (ref 9–20)
BUN: 13 mg/dL (ref 6–24)
CO2: 25 mmol/L (ref 20–29)
Calcium: 9.6 mg/dL (ref 8.7–10.2)
Chloride: 99 mmol/L (ref 96–106)
Creatinine, Ser: 1.16 mg/dL (ref 0.76–1.27)
GFR calc Af Amer: 81 mL/min/{1.73_m2} (ref >59)
GFR calc non Af Amer: 70 mL/min/{1.73_m2} (ref >59)
Glucose: 152 mg/dL — ABNORMAL HIGH (ref 65–99)
Potassium: 3.6 mmol/L (ref 3.5–5.2)
Sodium: 140 mmol/L (ref 134–144)

## 2017-04-23 LAB — VITAMIN B12: Vitamin B-12: 483 pg/mL (ref 232–1245)

## 2017-05-22 ENCOUNTER — Telehealth: Payer: Self-pay | Admitting: Family Medicine

## 2017-05-22 NOTE — Telephone Encounter (Signed)
Pt asked if Dr. Raliegh Ip could prescribe and control his depression and anxiety medications.  He doesn't have a local psychiatrist at this time and he doesn't want to have to go to [facility].  His call back number is [phone]

## 2017-05-23 NOTE — Telephone Encounter (Signed)
Called patient back, reviewed this with him, he is taking Buspirone 10mg  TID and Trazodone 100mg  nightly (he has not been taking the 2nd dose Trazodone), also Trintellix 10mg  daily - I have agreed to take over care prescribing these meds as long as his mental health remains relatively stable and we are not changing doses or adding meds. He understands and if need may need to refer to Psych either locally considered [facility] or [place] or possibly [place] as per his therapist recommendation.  He will need buspar and trazodone in next 2-4 weeks, he will call office before refill to request. He wants 30 day supply only Buspar 90 pills and reduce Trazodone from 60 pills down to 30 pills for 30 day supply.  He does not need Trintellix rx it is filled through a patient assistance program for free/reduced cost, good through year on that one.  Nobie Putnam, [facility] 05/23/2017, 9:23 AM

## 2017-05-29 ENCOUNTER — Ambulatory Visit (INDEPENDENT_AMBULATORY_CARE_PROVIDER_SITE_OTHER): Payer: Managed Care, Other (non HMO) | Admitting: Nurse Practitioner

## 2017-05-29 ENCOUNTER — Other Ambulatory Visit: Payer: Self-pay

## 2017-05-29 ENCOUNTER — Encounter: Payer: Self-pay | Admitting: Nurse Practitioner

## 2017-05-29 VITALS — BP 125/79 | HR 113 | Temp 99.1°F | Ht 68.0 in | Wt 210.8 lb

## 2017-05-29 DIAGNOSIS — M10061 Idiopathic gout, right knee: Secondary | ICD-10-CM

## 2017-05-29 MED ORDER — PREDNISONE 20 MG PO TABS: ORAL_TABLET | ORAL | 0 refills | Status: DC

## 2017-05-29 NOTE — Progress Notes (Signed)
Subjective:    Patient ID: Jorge Jordan, male    DOB: 1960-04-30, 57 y.o.   MRN: [id number]  Jorge Jordan is a 57 y.o. male presenting on 05/29/2017 for Gout (severe Rt Knee pain and swelling x 2 day)   HPI Gout R knee Pt reports onset of severe right knee pain and swelling onset 2 days ago.  Pt reports he has had 3 flares in bilateral great toes, each 5 or more years apart.  He has never had gout in knee. - Reports gradual worsening throughout the day Tuesday.  Tues am was stiff, knee was very swollen and didn't want covers to touch his knee Tuesday night.  Pt reports burning, intense aching pain with sharp pain during movement and during palpation.  Pt is using crutches to assist with walking. - Has taken ibuprofen without much relief. - Is not on long-term suppressive medications for gout.   Social History   Tobacco Use  . Smoking status: Never Smoker  . Smokeless tobacco: Never Used  Substance Use Topics  . Alcohol use: No    Comment: Abstaining from alcohol since 01/19/16  . Drug use: No    Review of Systems Per HPI unless specifically indicated above     Objective:    BP 125/79 (BP Location: Right Arm, Patient Position: Sitting, Cuff Size: Normal)   Pulse (!) 113   Temp 99.1 F (37.3 C) (Oral)   Ht 5\' 8"  (1.727 m)   Wt 210 lb 12.8 oz (95.6 kg)   BMI 32.05 kg/m   Wt Readings from Last 3 Encounters:  05/29/17 210 lb 12.8 oz (95.6 kg)  04/21/17 215 lb (97.5 kg)  01/20/17 210 lb (95.3 kg)    Physical Exam General - obese, well-appearing, NAD HEENT - Normocephalic, atraumatic Heart - RRR, no murmurs heard Lungs - Clear throughout all lobes, no wheezing, crackles, or rhonchi. Normal work of breathing. Musculoskeletal - Right Knee Inspection: Edematous, erythematous general knee joint area. No ecchymosis or effusion. Palpation: Exquisitely tender to palpation general knee joint and soft tissues above and below knee.  ROM: Full active ROM Left Knee.  Severely  limited flex/extension R knee with pain during full movements. Special Testing: unable to perform 2/2 pain Strength: 3/5 intact knee flex/ext, 5/5 ankle dorsi/plantarflex Neurovascular: distally intact sensation light touch and pulses  Extremeties - cap refill < 2 seconds, peripheral pulses intact +2 bilaterally Skin - warm, dry Neuro - awake, alert, oriented x3 Psych - Normal mood and affect, normal behavior   Results for orders placed or performed in visit on 04/21/17  CBC with Differential/Platelet  Result Value Ref Range   WBC 5.5 3.4 - 10.8 x10E3/uL   RBC 4.76 4.14 - 5.80 x10E6/uL   Hemoglobin 16.3 13.0 - 17.7 g/dL   Hematocrit 46.2 37.5 - 51.0 %   MCV 97 79 - 97 fL   MCH 34.2 (H) 26.6 - 33.0 pg   MCHC 35.3 31.5 - 35.7 g/dL   RDW 13.1 12.3 - 15.4 %   Platelets 241 150 - 379 x10E3/uL   Neutrophils 57 Not Estab. %   Lymphs 26 Not Estab. %   Monocytes 12 Not Estab. %   Eos 5 Not Estab. %   Basos 0 Not Estab. %   Neutrophils Absolute 3.2 1.4 - 7.0 x10E3/uL   Lymphocytes Absolute 1.4 0.7 - 3.1 x10E3/uL   Monocytes Absolute 0.7 0.1 - 0.9 x10E3/uL   EOS (ABSOLUTE) 0.3 0.0 - 0.4 x10E3/uL  Basophils Absolute 0.0 0.0 - 0.2 x10E3/uL   Immature Granulocytes 0 Not Estab. %   Immature Grans (Abs) 0.0 0.0 - 0.1 x10E3/uL  VITAMIN D 25 Hydroxy (Vit-D Deficiency, Fractures)  Result Value Ref Range   Vit D, 25-Hydroxy 13.8 (L) 30.0 - 100.0 ng/mL  Vitamin B12  Result Value Ref Range   Vitamin B-12 483 232 - 1,245 pg/mL  Basic Metabolic Panel (BMET)  Result Value Ref Range   Glucose 152 (H) 65 - 99 mg/dL   BUN 13 6 - 24 mg/dL   Creatinine, Ser 1.16 0.76 - 1.27 mg/dL   GFR calc non Af Amer 70 >59 mL/min/1.73   GFR calc Af Amer 81 >59 mL/min/1.73   BUN/Creatinine Ratio 11 9 - 20   Sodium 140 134 - 144 mmol/L   Potassium 3.6 3.5 - 5.2 mmol/L   Chloride 99 96 - 106 mmol/L   CO2 25 20 - 29 mmol/L   Calcium 9.6 8.7 - 10.2 mg/dL      Assessment & Plan:   Problem List Items Addressed  This Visit    None    Visit Diagnoses    Acute idiopathic gout of right knee    -  Primary Pt with acute R knee pain with severe tenderness to palpation and joint swelling and edema.  Likely gout with pt history of gout.  Cannot fully exclude septic arthritis, however pt without any fever, systemic signs or symptoms of infection.  Plan: 1. Start prednisone taper over 10 days. Take prednisone taper 20 mg tablets Day 1-4 take 3 pills at one time; Day 5-6: Take 2 pills; Day 7-8: Take 1 pills; Day 9-10: Take 1/2 pill; then stop. 2. May use acetaminophen for breakthrough pain.  Avoid all NSAIDS while taking prednisone. 3. Discussed option of colchicine, but opt to wait as can cause worsening symptoms. 4. May apply heat/ice prn. 5. Can use topical pain relief OTC agents prn 6. Followup 7-10 days if not improving.  Followup sooner in ED if severely worsening joint pain, fever, chills, sweats for septic arthritis treatment.  Pt verbalizes understanding.   Relevant Medications   predniSONE (DELTASONE) 20 MG tablet      Meds ordered this encounter  Medications  . predniSONE (DELTASONE) 20 MG tablet    Sig: Day 1-4 take 3 pills once daily.  Day 5-6 take 2 pills.  Day 7-8 take 1 pill.  Day 9-10 take 1/2 pill.    Dispense:  19 tablet    Refill:  0    Order Specific Question:   Supervising Provider    Answer:   Olin Hauser [[id number]]      Follow up plan: Return 7-10 days if symptoms worsen or fail to improve.  Cassell Smiles, DNP, AGPCNP-BC Adult Gerontology Primary Care Nurse Practitioner [facility] 06/05/2017, 9:42 PM

## 2017-05-29 NOTE — Patient Instructions (Addendum)
Jorge Jordan, Thank you for coming in to clinic today.  1. Take prednisone taper 20 mg tablets Day 1-4 (Today): Take 3 pills at one time Day 5-6: Take 2 pills  Day 7-8: Take 1 pills Day 9-10: Take 1/2 pill then stop.  2. Start taking Tylenol extra strength 1 to 2 tablets every 6-8 hours for aches/pain for next few days as needed.  Do not take more than 3,000 mg in 24 hours from all medicines.  - Use heat and ice.  Apply this for 15 minutes at a time 6-8 times per day.   - Muscle rub with lidocaine, lidocaine patch, Biofreeze, or tiger balm for topical pain relief.  Avoid using this with heat and ice to avoid burns.  Please schedule a follow-up appointment with Cassell Smiles, AGNP. Return 7-10 days if symptoms worsen or fail to improve.  If you have any other questions or concerns, please feel free to call the clinic or send a message through [place]. You may also schedule an earlier appointment if necessary.  You will receive a survey after today's visit either digitally by e-mail or paper by [employer]. Your experiences and feedback matter to [place].  Please respond so we know how we are doing as we provide care for you.   Cassell Smiles, DNP, AGNP-BC Adult Gerontology Nurse Practitioner [facility], [facility]  Gout Gout is painful swelling that can occur in some of your joints. Gout is a type of arthritis. This condition is caused by having too much uric acid in your body. Uric acid is a chemical that forms when your body breaks down substances called purines. Purines are important for building body proteins. When your body has too much uric acid, sharp crystals can form and build up inside your joints. This causes pain and swelling. Gout attacks can happen quickly and be very painful (acute gout). Over time, the attacks can affect more joints and become more frequent (chronic gout). Gout can also cause uric acid to build up under your skin and inside your kidneys. What are the  causes? This condition is caused by too much uric acid in your blood. This can occur because:  Your kidneys do not remove enough uric acid from your blood. This is the most common cause.  Your body makes too much uric acid. This can occur with some cancers and cancer treatments. It can also occur if your body is breaking down too many red blood cells (hemolytic anemia).  You eat too many foods that are high in purines. These foods include organ meats and some seafood. Alcohol, especially beer, is also high in purines.  A gout attack may be triggered by trauma or stress. What increases the risk? This condition is more likely to develop in people who:  Have a family history of gout.  Are male and middle-aged.  Are male and have gone through menopause.  Are obese.  Frequently drink alcohol, especially beer.  Are dehydrated.  Lose weight too quickly.  Have an organ transplant.  Have lead poisoning.  Take certain medicines, including aspirin, cyclosporine, diuretics, levodopa, and niacin.  Have kidney disease or psoriasis.  What are the signs or symptoms? An attack of acute gout happens quickly. It usually occurs in just one joint. The most common place is the big toe. Attacks often start at night. Other joints that may be affected include joints of the feet, ankle, knee, fingers, wrist, or elbow. Symptoms may include:  Severe pain.  Warmth.  Swelling.  Stiffness.  Tenderness. The affected joint may be very painful to touch.  Shiny, red, or purple skin.  Chills and fever.  Chronic gout may cause symptoms more frequently. More joints may be involved. You may also have white or yellow lumps (tophi) on your hands or feet or in other areas near your joints. How is this diagnosed? This condition is diagnosed based on your symptoms, medical history, and physical exam. You may have tests, such as:  Blood tests to measure uric acid levels.  Removal of joint fluid with a  needle (aspiration) to look for uric acid crystals.  X-rays to look for joint damage.  How is this treated? Treatment for this condition has two phases: treating an acute attack and preventing future attacks. Acute gout treatment may include medicines to reduce pain and swelling, including:  NSAIDs.  Steroids. These are strong anti-inflammatory medicines that can be taken by mouth (orally) or injected into a joint.  Colchicine. This medicine relieves pain and swelling when it is taken soon after an attack. It can be given orally or through an IV tube.  Preventive treatment may include:  Daily use of smaller doses of NSAIDs or colchicine.  Use of a medicine that reduces uric acid levels in your blood.  Changes to your diet. You may need to see a specialist about healthy eating (dietitian).  Follow these instructions at home: During a Gout Attack  If directed, apply ice to the affected area: ? Put ice in a plastic bag. ? Place a towel between your skin and the bag. ? Leave the ice on for 20 minutes, 2-3 times a day.  Rest the joint as much as possible. If the affected joint is in your leg, you may be given crutches to use.  Raise (elevate) the affected joint above the level of your heart as often as possible.  Drink enough fluids to keep your urine clear or pale yellow.  Take over-the-counter and prescription medicines only as told by your health care provider.  Do not drive or operate heavy machinery while taking prescription pain medicine.  Follow instructions from your health care provider about eating or drinking restrictions.  Return to your normal activities as told by your health care provider. Ask your health care provider what activities are safe for you. Avoiding Future Gout Attacks  Follow a low-purine diet as told by your dietitian or health care provider. Avoid foods and drinks that are high in purines, including liver, kidney, anchovies, asparagus, herring,  mushrooms, mussels, and beer.  Limit alcohol intake to no more than 1 drink a day for nonpregnant women and 2 drinks a day for men. One drink equals 12 oz of beer, 5 oz of wine, or 1 oz of hard liquor.  Maintain a healthy weight or lose weight if you are overweight. If you want to lose weight, talk with your health care provider. It is important that you do not lose weight too quickly.  Start or maintain an exercise program as told by your health care provider.  Drink enough fluids to keep your urine clear or pale yellow.  Take over-the-counter and prescription medicines only as told by your health care provider.  Keep all follow-up visits as told by your health care provider. This is important. Contact a health care provider if:  You have another gout attack.  You continue to have symptoms of a gout attack after10 days of treatment.  You have side effects from your medicines.  You have chills or a fever.  You have burning pain when you urinate.  You have pain in your lower back or belly. Get help right away if:  You have severe or uncontrolled pain.  You cannot urinate. This information is not intended to replace advice given to you by your health care provider. Make sure you discuss any questions you have with your health care provider. Document Released: 03/22/2000 Document Revised: 08/31/2015 Document Reviewed: 01/05/2015 Elsevier Interactive Patient Education  [employer].

## 2017-06-05 ENCOUNTER — Encounter: Payer: Self-pay | Admitting: Nurse Practitioner

## 2017-06-18 ENCOUNTER — Other Ambulatory Visit: Payer: Self-pay | Admitting: Family Medicine

## 2017-06-18 ENCOUNTER — Encounter: Payer: Self-pay | Admitting: Family Medicine

## 2017-06-18 ENCOUNTER — Ambulatory Visit (INDEPENDENT_AMBULATORY_CARE_PROVIDER_SITE_OTHER): Payer: Managed Care, Other (non HMO) | Admitting: Family Medicine

## 2017-06-18 VITALS — BP 100/54 | HR 101 | Temp 98.6°F | Ht 68.0 in | Wt 209.0 lb

## 2017-06-18 DIAGNOSIS — F4310 Post-traumatic stress disorder, unspecified: Secondary | ICD-10-CM

## 2017-06-18 DIAGNOSIS — M150 Primary generalized (osteo)arthritis: Secondary | ICD-10-CM | POA: Diagnosis not present

## 2017-06-18 DIAGNOSIS — M25561 Pain in right knee: Secondary | ICD-10-CM | POA: Diagnosis not present

## 2017-06-18 DIAGNOSIS — M8949 Other hypertrophic osteoarthropathy, multiple sites: Secondary | ICD-10-CM

## 2017-06-18 DIAGNOSIS — M159 Polyosteoarthritis, unspecified: Secondary | ICD-10-CM

## 2017-06-18 DIAGNOSIS — F339 Major depressive disorder, recurrent, unspecified: Secondary | ICD-10-CM

## 2017-06-18 MED ORDER — MELOXICAM 15 MG PO TABS: 15.0000 mg | ORAL_TABLET | Freq: Every day | ORAL | 1 refills | Status: DC

## 2017-06-18 MED ORDER — TRAZODONE HCL 100 MG PO TABS: 100.0000 mg | ORAL_TABLET | Freq: Every day | ORAL | 5 refills | Status: DC

## 2017-06-18 MED ORDER — BUSPIRONE HCL 10 MG PO TABS: 10.0000 mg | ORAL_TABLET | Freq: Three times a day (TID) | ORAL | 5 refills | Status: DC

## 2017-06-18 NOTE — Progress Notes (Signed)
Subjective:    Patient ID: Jorge Jordan, male    DOB: 23-Sep-1960, 57 y.o.   MRN: [id number]  Jorge Jordan is a 57 y.o. male presenting on 06/18/2017 for Knee Pain (pt treated x 3.5 for gout in his Right knee. The pt states symptoms started to improve w/ treatment, but never resolved. )  Patient presents for a same day appointment.  HPI   RIGHT KNEE PAIN History of gout before, in past R big toe 2-3 times in past, now he was seen recently by Cassell Smiles, AGPCNP-BC on 05/29/17 while I was out of office. He had acute onset R knee stiffness, swelling, and pain at that time, sudden onset similar to gout in toe, next day within 24 hour worsening he had difficulty walking on it due to pain, he purchased crutches due to pain. He took some ibuprofen PRN. He was seen on 2/21 and treated with Prednisone taper over 10 days, high potency dosing, and used RICE therapy ice compression and avoided excess walking. - Interval update - after Day 3 of prednisone his symptoms of pain was reduced maybe to 70%, had persistent swelling - Today he reports recently over past 3 weeks pain remained constant pain and swelling that is moderate but not as severe but not significantly improved - Now worse in past 4-5 days with pain and swelling increasing again, now he is limping with walking, not using crutches or cane for support - He has seen [facility] in past for other joints and other areas, has not been back recently - Feels best if neutral position, worse with walking, difficulty with walking. Worse in full flexion / extension - Denies fevers, chills, nausea vomiting, redness spreading, numbness tingling weakness, injury fall twist   Depression screen PHQ 2/9 01/20/2017  Decreased Interest 2  Down, Depressed, Hopeless 2  PHQ - 2 Score 4  Altered sleeping 1  Tired, decreased energy 3  Change in appetite 3  Feeling bad or failure about yourself  2  Trouble concentrating 2  Moving slowly or  fidgety/restless 0  Suicidal thoughts 1  PHQ-9 Score 16  Difficult doing work/chores Very difficult    Social History   Tobacco Use  . Smoking status: Never Smoker  . Smokeless tobacco: Never Used  Substance Use Topics  . Alcohol use: No    Comment: Abstaining from alcohol since 01/19/16  . Drug use: No    Review of Systems Per HPI unless specifically indicated above     Objective:    BP (!) 100/54 (BP Location: Left Arm, Patient Position: Sitting, Cuff Size: Large)   Pulse (!) 101   Temp 98.6 F (37 C) (Oral)   Ht 5\' 8"  (1.727 m)   Wt 209 lb (94.8 kg)   BMI 31.78 kg/m   Wt Readings from Last 3 Encounters:  06/18/17 209 lb (94.8 kg)  05/29/17 210 lb 12.8 oz (95.6 kg)  04/21/17 215 lb (97.5 kg)    Physical Exam  Constitutional: He is oriented to person, place, and time. He appears well-developed and well-nourished. No distress.  Well-appearing,slightly uncomfortable with R knee pain, cooperative  HENT:  Head: Normocephalic and atraumatic.  Mouth/Throat: Oropharynx is clear and moist.  Eyes: Conjunctivae are normal. Right eye exhibits no discharge. Left eye exhibits no discharge.  Cardiovascular: Normal rate.  Pulmonary/Chest: Effort normal.  Musculoskeletal: He exhibits no edema.  Right Knee Inspection: mild appearance of soft tissue edema vs effusion, not dramatically different appearance from L knee.  No bulkiness. No ecchymosis or erythema. Palpation: Moderate tenderness to palpation localized to medial joint line at the Pes Anserine Bursa, some slight fine crepitus ROM: Slightly reduced full range ext / flex, otherwise good ROM Special Testing: Lachman / Valgus/Varus tests negative with intact ligaments (ACL, MCL, LCL). Standing Thessaly testing positive for provoking meniscus symptoms lateral/medial, with worst pain medial Strength: 5/5 intact knee flex/ext, ankle dorsi/plantarflex Neurovascular: distally intact sensation light touch and pulses  Neurological:  He is alert and oriented to person, place, and time.  Skin: Skin is warm and dry. No rash noted. He is not diaphoretic. No erythema.  Psychiatric: His behavior is normal.  Well groomed, good eye contact, normal speech and thoughts  Nursing note and vitals reviewed.  Results for orders placed or performed in visit on 04/21/17  CBC with Differential/Platelet  Result Value Ref Range   WBC 5.5 3.4 - 10.8 x10E3/uL   RBC 4.76 4.14 - 5.80 x10E6/uL   Hemoglobin 16.3 13.0 - 17.7 g/dL   Hematocrit 46.2 37.5 - 51.0 %   MCV 97 79 - 97 fL   MCH 34.2 (H) 26.6 - 33.0 pg   MCHC 35.3 31.5 - 35.7 g/dL   RDW 13.1 12.3 - 15.4 %   Platelets 241 150 - 379 x10E3/uL   Neutrophils 57 Not Estab. %   Lymphs 26 Not Estab. %   Monocytes 12 Not Estab. %   Eos 5 Not Estab. %   Basos 0 Not Estab. %   Neutrophils Absolute 3.2 1.4 - 7.0 x10E3/uL   Lymphocytes Absolute 1.4 0.7 - 3.1 x10E3/uL   Monocytes Absolute 0.7 0.1 - 0.9 x10E3/uL   EOS (ABSOLUTE) 0.3 0.0 - 0.4 x10E3/uL   Basophils Absolute 0.0 0.0 - 0.2 x10E3/uL   Immature Granulocytes 0 Not Estab. %   Immature Grans (Abs) 0.0 0.0 - 0.1 x10E3/uL  VITAMIN D 25 Hydroxy (Vit-D Deficiency, Fractures)  Result Value Ref Range   Vit D, 25-Hydroxy 13.8 (L) 30.0 - 100.0 ng/mL  Vitamin B12  Result Value Ref Range   Vitamin B-12 483 232 - 1,245 pg/mL  Basic Metabolic Panel (BMET)  Result Value Ref Range   Glucose 152 (H) 65 - 99 mg/dL   BUN 13 6 - 24 mg/dL   Creatinine, Ser 1.16 0.76 - 1.27 mg/dL   GFR calc non Af Amer 70 >59 mL/min/1.73   GFR calc Af Amer 81 >59 mL/min/1.73   BUN/Creatinine Ratio 11 9 - 20   Sodium 140 134 - 144 mmol/L   Potassium 3.6 3.5 - 5.2 mmol/L   Chloride 99 96 - 106 mmol/L   CO2 25 20 - 29 mmol/L   Calcium 9.6 8.7 - 10.2 mg/dL      Assessment & Plan:   Problem List Items Addressed This Visit    None    Visit Diagnoses    Acute pain of right knee    -  Primary   Relevant Medications   meloxicam (MOBIC) 15 MG tablet   Other  Relevant Orders   Ambulatory referral to Orthopedic Surgery   Primary osteoarthritis involving multiple joints       Relevant Medications   acetaminophen (TYLENOL) 500 MG tablet   meloxicam (MOBIC) 15 MG tablet   Other Relevant Orders   Ambulatory referral to Orthopedic Surgery      Subacute on R medial knee pain and swelling without known injury or trauma  - Previously presumed gout based on presentation, treated with Prednisone, mild improvement but unresolved  now x 3 weeks later is concern for other diagnosis or possible underlying OA/DJD vs Pes Anserine Bursitis flare (based on location and exam). Cannot rule out gout vs pseudogout however. Clinically unlikely to be infectious given lack of systemic symptoms, afebrile, no redness or warmth to joint - Known OA/DJD in other joints of body, without knee imaging - Additional concern possible medial meniscus injury with exam findings - Able to bear weight, no knee instability or mechanical locking - No prior history of knee surgery, arthroscopy - Not responding to conservative therapy  Plan: 1. Offered  X-ray - deferred since no acute trauma / injury, and agreement to proceed with Ortho referral and anticipate upcoming x-rays at their office - Start anti-inflammatory trial with rx Meloxicam 15mg  daily wc x 2-4 weeks then PRN - DC other NSAIDs - Start Tylenol 500-1000mg  per dose TID PRN breakthrough - Future may consider muscle relaxant RICE therapy (rest, ice, compression, elevation) for swelling, activity modification Follow-up 4 weeks if needed, otherwise may follow with Ortho  Return precautions to office or hospital if dramatic worsening have been reviewed  (ADDENDUM - after visit, [facility] staff notified me that [facility] was contacted for referral and they cannot schedule him do to active balance unpaid, patient was notified, he cannot proceed with this referral now, and he will notify us if/when ready for referral to other  location or if he can proceed with scheduling with them)  Meds ordered this encounter  Medications  . meloxicam (MOBIC) 15 MG tablet    Sig: Take 1 tablet (15 mg total) by mouth daily. For up to 2-4 weeks then as needed    Dispense:  30 tablet    Refill:  1   Orders Placed This Encounter  Procedures  . Ambulatory referral to Orthopedic Surgery    Referral Priority:   Routine    Referral Type:   Surgical    Referral Reason:   Specialty Services Required    Requested Specialty:   Orthopedic Surgery    Number of Visits Requested:   1      Follow up plan: Return in about 4 weeks (around 07/16/2017), or if symptoms worsen or fail to improve, for R knee pain.  Nobie Putnam, [facility] 06/18/2017, 11:30 PM

## 2017-06-18 NOTE — Patient Instructions (Addendum)
Thank you for coming to the office today.  1. Concern symptoms may be gout or arthritis still, concern now for possible meniscus  I think we need x-ray imaging and 2nd opinion  Referral back to [facility], see below, call if not heard back by Friday  Recommend trial of Anti-inflammatory with Meloxicam 15mg  tabs - take one with food and plenty of water ONCEdaily every day with food, for next 2 to 4 weeks, then you may take only as needed - DO NOT TAKE any ibuprofen, aleve, motrin while you are taking this medicine - It is safe to take Tylenol Ext Str 500mg  tabs - take 1 to 2 (max dose 1000mg ) every 6 hours as needed for breakthrough pain, max 24 hour daily dose is 6 to 8 tablets or 4000mg   Use RICE therapy: - R - Rest / relative rest with activity modification avoid overuse and frequent bending or pressure on bent knee - I - Ice packs (make sure you use a towel or sock / something to protect skin) - C - Compression with flexible Knee Sleeve to apply pressure and reduce swelling allowing more support - E - Elevation - if significant swelling, lift leg above heart level (toes above your nose) to help reduce swelling, most helpful at night after day of being on your feet  [facility] ([facility] Address: [address], [place], [place] [address] Hours:  9AM-5PM Phone: [phone]   Please schedule a Follow-up Appointment to: Return in about 4 weeks (around 07/16/2017), or if symptoms worsen or fail to improve, for R knee pain.  If you have any other questions or concerns, please feel free to call the office or send a message through [place]. You may also schedule an earlier appointment if necessary.  Additionally, you may be receiving a survey about your experience at our office within a few days to 1 week by e-mail or mail. We value your feedback.  Nobie Putnam, DO [place]

## 2017-07-02 ENCOUNTER — Telehealth: Payer: Self-pay | Admitting: Family Medicine

## 2017-07-02 DIAGNOSIS — F4310 Post-traumatic stress disorder, unspecified: Secondary | ICD-10-CM

## 2017-07-02 DIAGNOSIS — F339 Major depressive disorder, recurrent, unspecified: Secondary | ICD-10-CM

## 2017-07-02 MED ORDER — VORTIOXETINE HBR 10 MG PO TABS: 10.0000 mg | ORAL_TABLET | Freq: Every day | ORAL | 5 refills | Status: AC

## 2017-07-02 NOTE — Telephone Encounter (Signed)
Pt. Called requesting a refill on Trintellix until he find another Dr Pt  Call back  # is [phone] pt do have information to drug store.

## 2017-07-02 NOTE — Telephone Encounter (Signed)
See prior telephone note in 05/2017 we have agreed to continue his current psych medications since he has been stable without concerns or change.  I have sent rx Triltellix 10mg  daily to [facility], 30 day supply with 5 refills.  Of note, this medication is very expensive and often difficult to get covered, I may not be successful in getting this rx approved, and sometimes it has to be written by Psychiatry. However since it is a continuation of prior medication then hopeful it may be approved.  Nobie Putnam, [facility] 07/02/2017, 4:53 PM

## 2017-07-08 ENCOUNTER — Encounter (INDEPENDENT_AMBULATORY_CARE_PROVIDER_SITE_OTHER): Payer: Self-pay

## 2017-07-10 ENCOUNTER — Telehealth: Payer: Self-pay | Admitting: Family Medicine

## 2017-07-10 NOTE — Telephone Encounter (Signed)
See recent conversation / mychart messages, most recent 07/08/17, however patient does not have his mychart access and he did not see message.  I sent rx for trintellix to his pharmacy on 3/27 we received rejected fax and request prior authorization.  Called [place] today to review. He has also tried [place] for depression and failed this.  List of prior failed meds includes: - Latuda - Paxil (Paroxetine)  - Trazodone  - Buspar  This request is for a re-authorization of Trintellix since he was getting it approved for discount/cost through patient assistance program through [employer].  He will call us back today or tomorrow with full information on the [place] and how to reach them to get Patient Assistance on this medication.  Do not need to submit prior authorization or any other info to pharmacy/insurance at this time as it likely will not be covered.  Nobie Putnam, [facility] 07/10/2017, 12:52 PM

## 2017-07-11 NOTE — Telephone Encounter (Signed)
Patient call back with phone # [phone] and fax # [phone]. He is already approved for this year.

## 2017-07-11 NOTE — Telephone Encounter (Addendum)
Asa Lente called the company listed below, and confirmed that all they need is written rx from me faxed to them with his case # [id number] and my NPI, DEA with the rx to fill it.  Will be faxed today.  Called patient to give update. He will follow-up with the company for status update.  Nobie Putnam, [facility] 07/11/2017, 9:52 AM

## 2017-07-18 DIAGNOSIS — R69 Illness, unspecified: Secondary | ICD-10-CM | POA: Diagnosis not present

## 2017-07-18 LAB — COMPREHENSIVE METABOLIC PANEL
ALT: 60 IU/L — ABNORMAL HIGH (ref 0–44)
AST: 38 IU/L (ref 0–40)
Albumin/Globulin Ratio: 1.6 (ref 1.2–2.2)
Albumin: 4.3 g/dL (ref 3.5–5.5)
Alkaline Phosphatase: 93 IU/L (ref 39–117)
BUN/Creatinine Ratio: 13 (ref 9–20)
BUN: 16 mg/dL (ref 6–24)
Bilirubin Total: 0.6 mg/dL (ref 0.0–1.2)
CO2: 25 mmol/L (ref 20–29)
Calcium: 9.7 mg/dL (ref 8.7–10.2)
Chloride: 98 mmol/L (ref 96–106)
Creatinine, Ser: 1.24 mg/dL (ref 0.76–1.27)
GFR calc Af Amer: 75 mL/min/{1.73_m2} (ref >59)
GFR calc non Af Amer: 65 mL/min/{1.73_m2} (ref >59)
Globulin, Total: 2.7 g/dL (ref 1.5–4.5)
Glucose: 131 mg/dL — ABNORMAL HIGH (ref 65–99)
Potassium: 4.3 mmol/L (ref 3.5–5.2)
Sodium: 140 mmol/L (ref 134–144)
Total Protein: 7 g/dL (ref 6.0–8.5)

## 2017-07-18 LAB — CBC WITH DIFFERENTIAL/PLATELET
Basophils Absolute: 0 10*3/uL (ref 0.0–0.2)
Basos: 1 %
EOS (ABSOLUTE): 0.2 10*3/uL (ref 0.0–0.4)
Eos: 4 %
Hematocrit: 44.7 % (ref 37.5–51.0)
Hemoglobin: 15.9 g/dL (ref 13.0–17.7)
Immature Grans (Abs): 0 10*3/uL (ref 0.0–0.1)
Immature Granulocytes: 1 %
Lymphocytes Absolute: 1.2 10*3/uL (ref 0.7–3.1)
Lymphs: 30 %
MCH: 34.6 pg — ABNORMAL HIGH (ref 26.6–33.0)
MCHC: 35.6 g/dL (ref 31.5–35.7)
MCV: 97 fL (ref 79–97)
Monocytes Absolute: 0.6 10*3/uL (ref 0.1–0.9)
Monocytes: 15 %
Neutrophils Absolute: 2.1 10*3/uL (ref 1.4–7.0)
Neutrophils: 49 %
Platelets: 203 10*3/uL (ref 150–379)
RBC: 4.6 x10E6/uL (ref 4.14–5.80)
RDW: 13.4 % (ref 12.3–15.4)
WBC: 4.2 10*3/uL (ref 3.4–10.8)

## 2017-07-18 LAB — LIPID PANEL
Chol/HDL Ratio: 3.5 ratio (ref 0.0–5.0)
Cholesterol, Total: 208 mg/dL — ABNORMAL HIGH (ref 100–199)
HDL: 60 mg/dL (ref >39)
LDL Calculated: 118 mg/dL — ABNORMAL HIGH (ref 0–99)
Triglycerides: 152 mg/dL — ABNORMAL HIGH (ref 0–149)
VLDL Cholesterol Cal: 30 mg/dL (ref 5–40)

## 2017-07-18 LAB — HEMOGLOBIN A1C
Est. average glucose Bld gHb Est-mCnc: 134 mg/dL
Hgb A1c MFr Bld: 6.3 % — ABNORMAL HIGH (ref 4.8–5.6)

## 2017-07-18 LAB — VITAMIN D 25 HYDROXY (VIT D DEFICIENCY, FRACTURES): Vit D, 25-Hydroxy: 29.2 ng/mL — ABNORMAL LOW (ref 30.0–100.0)

## 2017-07-18 LAB — PSA: Prostate Specific Ag, Serum: 1.4 ng/mL (ref 0.0–4.0)

## 2017-07-21 ENCOUNTER — Other Ambulatory Visit: Payer: Self-pay | Admitting: Family Medicine

## 2017-07-21 ENCOUNTER — Ambulatory Visit (INDEPENDENT_AMBULATORY_CARE_PROVIDER_SITE_OTHER): Payer: Managed Care, Other (non HMO) | Admitting: Family Medicine

## 2017-07-21 ENCOUNTER — Encounter: Payer: Self-pay | Admitting: Family Medicine

## 2017-07-21 VITALS — BP 128/76 | HR 94 | Temp 98.8°F | Resp 16 | Ht 68.0 in | Wt 210.6 lb

## 2017-07-21 DIAGNOSIS — R740 Nonspecific elevation of levels of transaminase and lactic acid dehydrogenase [LDH]: Secondary | ICD-10-CM | POA: Diagnosis not present

## 2017-07-21 DIAGNOSIS — R7303 Prediabetes: Secondary | ICD-10-CM

## 2017-07-21 DIAGNOSIS — M150 Primary generalized (osteo)arthritis: Secondary | ICD-10-CM | POA: Diagnosis not present

## 2017-07-21 DIAGNOSIS — F419 Anxiety disorder, unspecified: Secondary | ICD-10-CM

## 2017-07-21 DIAGNOSIS — E782 Mixed hyperlipidemia: Secondary | ICD-10-CM

## 2017-07-21 DIAGNOSIS — F4310 Post-traumatic stress disorder, unspecified: Secondary | ICD-10-CM | POA: Diagnosis not present

## 2017-07-21 DIAGNOSIS — M8949 Other hypertrophic osteoarthropathy, multiple sites: Secondary | ICD-10-CM | POA: Insufficient documentation

## 2017-07-21 DIAGNOSIS — R7309 Other abnormal glucose: Secondary | ICD-10-CM

## 2017-07-21 DIAGNOSIS — I10 Essential (primary) hypertension: Secondary | ICD-10-CM

## 2017-07-21 DIAGNOSIS — F339 Major depressive disorder, recurrent, unspecified: Secondary | ICD-10-CM | POA: Diagnosis not present

## 2017-07-21 DIAGNOSIS — Z0000 Encounter for general adult medical examination without abnormal findings: Secondary | ICD-10-CM | POA: Diagnosis not present

## 2017-07-21 DIAGNOSIS — E559 Vitamin D deficiency, unspecified: Secondary | ICD-10-CM | POA: Diagnosis not present

## 2017-07-21 DIAGNOSIS — R7401 Elevation of levels of liver transaminase levels: Secondary | ICD-10-CM

## 2017-07-21 DIAGNOSIS — M159 Polyosteoarthritis, unspecified: Secondary | ICD-10-CM | POA: Insufficient documentation

## 2017-07-21 MED ORDER — MELOXICAM 15 MG PO TABS: 15.0000 mg | ORAL_TABLET | Freq: Every day | ORAL | 2 refills | Status: DC

## 2017-07-21 NOTE — Assessment & Plan Note (Signed)
Likely elevated mild ALT due to alcohol resumed Counseling cessation alcohol Follow-up trend in future

## 2017-07-21 NOTE — Patient Instructions (Addendum)
Thank you for coming to the office today.  Refilled meloxicam for knee joint pain - use only as needed going forward, take with food, maybe 1-2 weeks at most, then stop for period of time. Use only if flare up  1st start with Tylenol - Recommend to start taking Tylenol Extra Strength 575m tabs - take 1 to 2 tabs per dose (max 100106m every 6-8 hours for pain (take regularly, don't skip a dose for next 7 days), max 24 hour daily dose is 6 tablets or 300015mIn the future you can repeat the same everyday Tylenol course for 1-2 weeks at a time.   All meds otherwise good on refills  Concern A1c 6.3, likely biggest factor is alcohol intake with beer, very high carb / starch usually, recommend reducing alcohol intake as discussed.  Try to stay active with exercise as you are.   Colon Cancer Screening: - For all adults age 57+52+utine colon cancer screening is highly recommended.     - Recent guidelines from [place] starting age of 45 11Early detection of colon cancer is important, because often there are no warning signs or symptoms, also if found early usually it can be cured. Late stage is hard to treat.  - If you are not interested in Colonoscopy screening (if done and normal you could be cleared for 5 to 10 years until next due), then Cologuard is an excellent alternative for screening test for Colon Cancer. It is highly sensitive for detecting DNA of colon cancer from even the earliest stages. Also, there is NO bowel prep required. - If Cologuard is NEGATIVE, then it is good for 3 years before next due - If Cologuard is POSITIVE, then it is strongly advised to get a Colonoscopy, which allows the GI doctor to locate the source of the cancer or polyp (even very early stage) and treat it by removing it. ------------------------- If you would like to proceed with Cologuard (stool DNA test) - FIRST, call your insurance company and tell them you want to check cost of  Cologuard tell them CPT Code [id number] may be completely covered and you could get for no cost, OR max cost without any coverage is about $600). Also, keep in mind if you do NOT open the kit, and decide not to do the test, you will NOT be charged, you should contact the company if you decide not to do the test.  ASK THEM WHAT COST OF A DIAGNOSTIC COLONOSCOPY  - If you want to proceed, you can notify us [place] message, [employer] at next visit) and we will order it for you. The test kit will be delivered to you house within about 1 week. Follow instructions to collect sample, you may call the company for any help or questions, 24/7 telephone support at [phone] Lab work through [employer] 3 months  CMET chemistry and A1c test   Please schedule a Follow-up Appointment to: Return in about 3 months (around 10/20/2017) for Elevated A1c follow-up ([facility] results).  If you have any other questions or concerns, please feel free to call the office or send a message through [place] may also schedule an earlier appointment if necessary.  Additionally, you may be receiving a survey about your experience at our office within a few days to 1 week by e-mail or mail. We value your feedback.  AleNobie PutnamO [place]

## 2017-07-21 NOTE — Assessment & Plan Note (Signed)
Stable chronic problem No longer followed by Psychiatry Controlled on current meds Trintellix, Trazodone, Buspar Follow-up, continue counseling

## 2017-07-21 NOTE — Assessment & Plan Note (Signed)
Left knee pain seems most likely related to underlying OA/DJD presumed Without recent imaging X-ray Improved on meloxicam anti inflammatory Reassurance Advised caution with NSAID long-term, he may take intermittently only, rx refill today maybe take 1-2 week on then off for few weeks, start with regular Tylenol and use NSAID ONLY PRN Reviewed conservative care for knee

## 2017-07-21 NOTE — Assessment & Plan Note (Signed)
Stable, anxiety symptoms Secondary to known PTSD and mental health conditions Controlled currently on Buspar and Trintellix Follow-up as planned, continue with counseling services

## 2017-07-21 NOTE — Progress Notes (Signed)
Subjective:    Patient ID: Jorge Jordan, male    DOB: [birth year], 57 y.o.   MRN: [id number]  Jorge Jordan is a 57 y.o. male presenting on 07/21/2017 for Annual Exam; Osteoarthritis; Depression; and Elevated Blood Sugar   HPI   Here for Annual Physical and Lab Review.  Specialists Counseling - [place] [address] Earnestine Leys ([facility])  Elevated A1c - Pre-Diabetes / Obesity BMI >32 Prior trend with controlled A1c had improved from 5.9 down to 5.6 at last visit 01/2017. Now today with recent lab showed elevated A1c 6.3, despite some improved lifestyle and exercise, but he attributes to inc alcohol intake beer Meds: None (never on meds) Currently on ACEi Lifestyle: - Diet (Added beer intake on weekends, Fri/Sat will have 6 beers per night by his report, he is aware he needs to reduce this, otherwise his diet had improved reduced carb/sugars, reduced diet soda now to 1 daily most days) - Exercise (3x weekly, 45 min on treadmill, improved) - Family history of DM  CHRONIC HTN: Reports checks BP at home with normal BP readings on avg Current Meds - Lisinopril-HCTZ 20-25mg  daily, Amlodipine 5mg  daily Reports good compliance, took meds today. Tolerating well, w/o complaints.  Elevated Creatinine / Presumed CKD-II - Reviewed prior labs with range 1.1 to 1.24 Cr No new concerns. Lab remains stable. He tries to improve water, see above drinking more alcohol now. Also taking Meloxicam most days over past 1-2 months with relief for Knee Pain.  HYPERLIPIDEMIA / ELEVATED LFTs / H/o Alcohol Abuse He had recent elevated ALT 60, previously had been normal. Now he has resumed beer alcohol intake on weekends only. - Currently taking Pravastatin 40mg , tolerating well without side effects or myalgias - Weight stable - taking ASA 81  MAJOR DEPRESSION + Anxiety, PTSD He reports these chronic problems have been relatively stable, he previously was seen  at [facility] and then now goes to [facility] in [place] for counseling. He no longer sees Psychiatry and has requested that we resume rx of his mental health medicines, this has been stable since 05/2017, he was on financial assistance for Trintellix through manufacturer, and this was approved recently and he is awaiting on this rx should arrive soon. - He is tolerating meds well, without concern, see PHQ GAD score below - Continues on Trintellix 10mg  daily, Trazodone 100mg  nightly, Buspirone 10mg  TID  CHRONIC RIGHT knee Pain Osteoarthritis Last seen for this problem in 06/2017, he was initially treated for gout, then ultimately determined it was more OA/DJD pain at follow-up, he was treated with meloxicam 15mg  PRN and deferred X-ray. This anti inflammatory helped control his pain, he was taking it more regularly, now asking about it today. - Admits some pain with inc activity without significant swelling or other problem He was referred to [facility] in past at that time but did not see, I believe due to insurance or balance No injury or trauma  Additional social history: - He is awaiting finalizing disability, he was given favorable judgement for coverage in March 2019, based on mental health diagnoses with Major Depression Recurrent and Anxiety complicated by PTSD   Health Maintenance:  - Prostate CA Screening: Prior PSA / DRE reported normal. Last PSA 1.4 (07/2017). Currently asymptomatic without endorsing any BPH  LUTS or nocturia. No known family history of prostate CA.   - Colon CA Screening: Never had colonoscopy. He has done [facility] home FOBT testing, previously negative,  he will turn in results on this test. He is interested in Cologuard as well. Currently asymptomatic. No known family history of colon CA. Due for screening test  - Declines routine HIV Screening   Depression screen [facility] 2/9 07/21/2017 01/20/2017  Decreased Interest 1 2  Down, Depressed, Hopeless 0 2  PHQ -  2 Score 1 4  Altered sleeping 0 1  Tired, decreased energy 1 3  Change in appetite 1 3  Feeling bad or failure about yourself  1 2  Trouble concentrating 0 2  Moving slowly or fidgety/restless 0 0  Suicidal thoughts 0 1  PHQ-9 Score 4 16  Difficult doing work/chores Very difficult Very difficult   GAD 7 : Generalized Anxiety Score 07/21/2017 05/30/2015  Nervous, Anxious, on Edge 2 3  Control/stop worrying 2 3  Worry too much - different things 1 3  Trouble relaxing 1 3  Restless 1 3  Easily annoyed or irritable 1 3  Afraid - awful might happen 1 3  Total GAD 7 Score 9 21  Anxiety Difficulty Very difficult Somewhat difficult    Past Medical History:  Diagnosis Date  . Allergy   . Anxiety   . Arthritis   . Depression   . Foot deformity    foot arthrodesis L foot  . Hyperlipidemia   . Hypertension   . Osteoporosis   . Shoulder bursitis    both   . Tendinitis    elbow   Past Surgical History:  Procedure Laterality Date  . FOOT SURGERY Left 2001   Triple arthrodesis  . ROTATOR CUFF REPAIR Left   . SHOULDER FUSION SURGERY    . WRIST SURGERY     ganglion cyst   Social History   Socioeconomic History  . Marital status: Married    Spouse name: Not on file  . Number of children: Not on file  . Years of education: Not on file  . Highest education level: Not on file  Occupational History  . Not on file  Social Needs  . Financial resource strain: Not on file  . Food insecurity:    Worry: Not on file    Inability: Not on file  . Transportation needs:    Medical: Not on file    Non-medical: Not on file  Tobacco Use  . Smoking status: Never Smoker  . Smokeless tobacco: Never Used  Substance and Sexual Activity  . Alcohol use: No    Comment: Abstaining from alcohol since 01/19/16  . Drug use: No  . Sexual activity: Not on file  Lifestyle  . Physical activity:    Days per week: Not on file    Minutes per session: Not on file  . Stress: Not on file    Relationships  . Social connections:    Talks on phone: Not on file    Gets together: Not on file    Attends religious service: Not on file    Active member of club or organization: Not on file    Attends meetings of clubs or organizations: Not on file    Relationship status: Not on file  . Intimate partner violence:    Fear of current or ex partner: Not on file    Emotionally abused: Not on file    Physically abused: Not on file    Forced sexual activity: Not on file  Other Topics Concern  . Not on file  Social History Narrative  . Not on file   Family History  Problem Relation Age of Onset  . Hyperlipidemia Mother   . Hypertension Mother   . Diabetes Mother   . Depression Mother   . Hypertension Father   . Hyperlipidemia Father   . Cancer Father        skin cancer  . Hypertension Brother   . Diabetes Maternal Grandmother   . Heart attack Maternal Grandfather    Current Outpatient Medications on File Prior to Visit  Medication Sig  . acetaminophen (TYLENOL) 500 MG tablet Take 1,000 mg by mouth every 6 (six) hours as needed.  Marland Kitchen amLODipine (NORVASC) 5 MG tablet Take 1 tablet (5 mg total) by mouth daily.  Marland Kitchen aspirin EC 81 MG tablet Take 81 mg by mouth daily.  . busPIRone (BUSPAR) 10 MG tablet Take 1 tablet (10 mg total) by mouth 3 (three) times daily.  Marland Kitchen lisinopril-hydrochlorothiazide (PRINZIDE,ZESTORETIC) 20-25 MG tablet Take 1 tablet by mouth daily.  . pravastatin (PRAVACHOL) 40 MG tablet Take 1 tablet (40 mg total) by mouth daily.  . sildenafil (REVATIO) 20 MG tablet Take 3-5 tablets as needed 30 prior to sex.  . traZODone (DESYREL) 100 MG tablet Take 1 tablet (100 mg total) by mouth at bedtime.  . vortioxetine HBr (TRINTELLIX) 10 MG TABS tablet Take 1 tablet (10 mg total) by mouth daily.   No current facility-administered medications on file prior to visit.     Review of Systems  Constitutional: Negative for activity change, appetite change, chills, diaphoresis,  fatigue and fever.  HENT: Negative for congestion and hearing loss.   Eyes: Negative for visual disturbance.  Respiratory: Negative for apnea, cough, choking, chest tightness, shortness of breath and wheezing.   Cardiovascular: Negative for chest pain, palpitations and leg swelling.  Gastrointestinal: Negative for abdominal distention, abdominal pain, anal bleeding, blood in stool, constipation, diarrhea, nausea and vomiting.  Endocrine: Negative for cold intolerance.  Genitourinary: Negative for decreased urine volume, difficulty urinating, dysuria, frequency, hematuria, testicular pain and urgency.  Musculoskeletal: Positive for arthralgias (right knee, intermittent). Negative for neck pain.  Skin: Negative for rash.  Allergic/Immunologic: Negative for environmental allergies.  Neurological: Negative for dizziness, weakness, light-headedness, numbness and headaches.  Hematological: Negative for adenopathy.  Psychiatric/Behavioral: Positive for dysphoric mood (intermittent, improved). Negative for behavioral problems and sleep disturbance. The patient is nervous/anxious (intermittent, improved).    Per HPI unless specifically indicated above      Objective:    BP 128/76   Pulse 94   Temp 98.8 F (37.1 C) (Oral)   Resp 16   Ht 5\' 8"  (1.727 m)   Wt 210 lb 9.6 oz (95.5 kg)   BMI 32.02 kg/m   Wt Readings from Last 3 Encounters:  07/21/17 210 lb 9.6 oz (95.5 kg)  06/18/17 209 lb (94.8 kg)  05/29/17 210 lb 12.8 oz (95.6 kg)    Physical Exam  Constitutional: He is oriented to person, place, and time. He appears well-developed and well-nourished. No distress.  Well-appearing, comfortable, cooperative  HENT:  Head: Normocephalic and atraumatic.  Mouth/Throat: Oropharynx is clear and moist.  Frontal / maxillary sinuses non-tender. Nares patent without purulence or edema. Bilateral TMs clear without erythema, effusion or bulging. Oropharynx clear without erythema, exudates, edema or  asymmetry.  Eyes: Pupils are equal, round, and reactive to light. Conjunctivae and EOM are normal. Right eye exhibits no discharge. Left eye exhibits no discharge.  Neck: Normal range of motion. Neck supple. No thyromegaly present.  Cardiovascular: Normal rate, regular rhythm, normal heart sounds and intact distal  pulses.  No murmur heard. Pulmonary/Chest: Effort normal and breath sounds normal. No respiratory distress. He has no wheezes. He has no rales.  Abdominal: Soft. Bowel sounds are normal. He exhibits no distension and no mass. There is no tenderness.  Musculoskeletal: Normal range of motion. He exhibits no edema or tenderness.  Upper / Lower Extremities: - Normal muscle tone, strength bilateral upper extremities 5/5, lower extremities 5/5  Right Knee Not bulky, erythema or effusion today. Minimal to non tender over joint line. Full active ROM, some crepitus  Lymphadenopathy:    He has no cervical adenopathy.  Neurological: He is alert and oriented to person, place, and time. A sensory deficit (left thumb, index/middle finger radial side, chronic carpal tunnel) is present.  Distal sensation intact to light touch all extremities  Skin: Skin is warm and dry. No rash noted. He is not diaphoretic. No erythema.  Psychiatric: He has a normal mood and affect. His behavior is normal.  Well groomed, good eye contact, normal speech and thoughts  Nursing note and vitals reviewed.  Results for orders placed or performed in visit on 04/21/17  CBC with Differential/Platelet  Result Value Ref Range   WBC 5.5 3.4 - 10.8 x10E3/uL   RBC 4.76 4.14 - 5.80 x10E6/uL   Hemoglobin 16.3 13.0 - 17.7 g/dL   Hematocrit 46.2 37.5 - 51.0 %   MCV 97 79 - 97 fL   MCH 34.2 (H) 26.6 - 33.0 pg   MCHC 35.3 31.5 - 35.7 g/dL   RDW 13.1 12.3 - 15.4 %   Platelets 241 150 - 379 x10E3/uL   Neutrophils 57 Not Estab. %   Lymphs 26 Not Estab. %   Monocytes 12 Not Estab. %   Eos 5 Not Estab. %   Basos 0 Not Estab.  %   Neutrophils Absolute 3.2 1.4 - 7.0 x10E3/uL   Lymphocytes Absolute 1.4 0.7 - 3.1 x10E3/uL   Monocytes Absolute 0.7 0.1 - 0.9 x10E3/uL   EOS (ABSOLUTE) 0.3 0.0 - 0.4 x10E3/uL   Basophils Absolute 0.0 0.0 - 0.2 x10E3/uL   Immature Granulocytes 0 Not Estab. %   Immature Grans (Abs) 0.0 0.0 - 0.1 x10E3/uL  VITAMIN D 25 Hydroxy (Vit-D Deficiency, Fractures)  Result Value Ref Range   Vit D, 25-Hydroxy 13.8 (L) 30.0 - 100.0 ng/mL  Vitamin B12  Result Value Ref Range   Vitamin B-12 483 232 - 1,245 pg/mL  Basic Metabolic Panel (BMET)  Result Value Ref Range   Glucose 152 (H) 65 - 99 mg/dL   BUN 13 6 - 24 mg/dL   Creatinine, Ser 1.16 0.76 - 1.27 mg/dL   GFR calc non Af Amer 70 >59 mL/min/1.73   GFR calc Af Amer 81 >59 mL/min/1.73   BUN/Creatinine Ratio 11 9 - 20   Sodium 140 134 - 144 mmol/L   Potassium 3.6 3.5 - 5.2 mmol/L   Chloride 99 96 - 106 mmol/L   CO2 25 20 - 29 mmol/L   Calcium 9.6 8.7 - 10.2 mg/dL      Assessment & Plan:   Problem List Items Addressed This Visit    Anxiety    Stable, anxiety symptoms Secondary to known PTSD and mental health conditions Controlled currently on Buspar and Trintellix Follow-up as planned, continue with counseling services      Elevated transaminase level    Likely elevated mild ALT due to alcohol resumed Counseling cessation alcohol Follow-up trend in future      Essential hypertension  Well-controlled HTN - Home BP readings normal  Complication with CKD-II   Plan:  1. Continue current BP regimen - Amlodipine 5mg  daily, Lisinopril-HCTZ - 20-25mg  daily 2. Encourage improved lifestyle - low sodium diet, regular exercise 3. Continue monitor BP outside office, bring readings to next visit, if persistently >140/90 or new symptoms notify office sooner 4. Follow-up 3-6 months monitor BP - may consider reduce HCTZ and keep ACEi      Hyperlipidemia    Mostly controlled, but slightly elevated LDL and borderline TG on statin Last  lipid panel 07/2017 Calculated ASCVD 10 yr risk score elevated ALT is likely from alcohol not statin  Plan: 1. Continue current meds - Pravastatin 40mg  daily 2. Continue ASA 81mg  for primary ASCVD risk reduction 3. Encourage improved lifestyle - low carb/cholesterol, reduce portion size, continue improving regular exercise 4. Follow-up 6-12 month lipids      Major depressive disorder, recurrent episode with anxious distress (HCC)    Stable, chronic problem followed by counseling at [facility] No longer sees Psychiatry Complicated by anxiety and PTSD - Counseling on avoiding alcohol or limiting intake now, he resumed drinking Continues on Trintellix, Buspirone, Trazodone - rx by me at this time, without significant changes Follow-up as planned q 3-6 months - if any significant worsening or change in mental health status we may refer him to alternative psychiatry office       Pre-diabetes    Elevated worsening control Pre-DM with A1c 6.3 from prior 0.9-3.8, uncertain exact reason, only major factor seems to be increased beer / alcohol intake binge on weekends now x 2 days a week- other lifestyle improved Concern with obesity, HTN, HLD  Plan:  1. Not on any therapy currently  2. Encourage improved lifestyle - low carb, low sugar diet, reduce portion size, continue improving regular exercise 3. Follow-up 3 months PreDM A1c - trend - cautioned on may need medicines in near future to control if progression or persistent elevated A1c PreDM      Primary osteoarthritis involving multiple joints    Left knee pain seems most likely related to underlying OA/DJD presumed Without recent imaging X-ray Improved on meloxicam anti inflammatory Reassurance Advised caution with NSAID long-term, he may take intermittently only, rx refill today maybe take 1-2 week on then off for few weeks, start with regular Tylenol and use NSAID ONLY PRN Reviewed conservative care for knee      Relevant  Medications   meloxicam (MOBIC) 15 MG tablet   PTSD (post-traumatic stress disorder)    Stable chronic problem No longer followed by Psychiatry Controlled on current meds Trintellix, Trazodone, Buspar Follow-up, continue counseling      Vitamin D deficiency    Improved vitamin D 29 May take OTC 1,000 to 2,000 iu daily maintenance       Other Visit Diagnoses    Annual physical exam    -  Primary    Reviewed health maintenance, recommended cologuard for colon cancer screening, since he was doing FOBT in past, he will turn in copy of last FOBT testing through [facility] - Reviewed labs and updates - Encourage keep improving lifestyle exercise diet, and he should improve / reduce alcohol intake reduce beer, to limit sugar and avoid worsening mental health problem   Meds ordered this encounter  Medications  . meloxicam (MOBIC) 15 MG tablet    Sig: Take 1 tablet (15 mg total) by mouth daily. For up to 1-2 weeks as needed    Dispense:  30 tablet    Refill:  2    Follow up plan: Return in about 3 months (around 10/20/2017) for Elevated A1c follow-up ([facility] results).  Nobie Putnam, [facility] 07/21/2017, 2:32 PM

## 2017-07-21 NOTE — Assessment & Plan Note (Signed)
Mostly controlled, but slightly elevated LDL and borderline TG on statin Last lipid panel 07/2017 Calculated ASCVD 10 yr risk score elevated ALT is likely from alcohol not statin  Plan: 1. Continue current meds - Pravastatin 40mg  daily 2. Continue ASA 81mg  for primary ASCVD risk reduction 3. Encourage improved lifestyle - low carb/cholesterol, reduce portion size, continue improving regular exercise 4. Follow-up 6-12 month lipids

## 2017-07-21 NOTE — Assessment & Plan Note (Addendum)
Stable, chronic problem followed by counseling at [facility] No longer sees Psychiatry Complicated by anxiety and PTSD - Counseling on avoiding alcohol or limiting intake now, he resumed drinking Continues on Trintellix, Buspirone, Trazodone - rx by me at this time, without significant changes Follow-up as planned q 3-6 months - if any significant worsening or change in mental health status we may refer him to alternative psychiatry office

## 2017-07-21 NOTE — Assessment & Plan Note (Addendum)
Well-controlled HTN - Home BP readings normal  Complication with CKD-II   Plan:  1. Continue current BP regimen - Amlodipine 5mg  daily, Lisinopril-HCTZ - 20-25mg  daily 2. Encourage improved lifestyle - low sodium diet, regular exercise 3. Continue monitor BP outside office, bring readings to next visit, if persistently >140/90 or new symptoms notify office sooner 4. Follow-up 3-6 months monitor BP - may consider reduce HCTZ and keep ACEi

## 2017-07-21 NOTE — Assessment & Plan Note (Signed)
Elevated worsening control Pre-DM with A1c 6.3 from prior 7.6-1.4, uncertain exact reason, only major factor seems to be increased beer / alcohol intake binge on weekends now x 2 days a week- other lifestyle improved Concern with obesity, HTN, HLD  Plan:  1. Not on any therapy currently  2. Encourage improved lifestyle - low carb, low sugar diet, reduce portion size, continue improving regular exercise 3. Follow-up 3 months PreDM A1c - trend - cautioned on may need medicines in near future to control if progression or persistent elevated A1c PreDM

## 2017-07-21 NOTE — Assessment & Plan Note (Signed)
Improved vitamin D 29 May take OTC 1,000 to 2,000 iu daily maintenance

## 2017-10-08 DIAGNOSIS — R69 Illness, unspecified: Secondary | ICD-10-CM | POA: Diagnosis not present

## 2017-10-20 ENCOUNTER — Ambulatory Visit: Payer: Managed Care, Other (non HMO) | Admitting: Family Medicine

## 2017-11-07 ENCOUNTER — Other Ambulatory Visit: Payer: Self-pay | Admitting: Family Medicine

## 2017-11-07 DIAGNOSIS — E7800 Pure hypercholesterolemia, unspecified: Secondary | ICD-10-CM

## 2017-11-28 ENCOUNTER — Other Ambulatory Visit: Payer: Self-pay | Admitting: Family Medicine

## 2017-11-28 DIAGNOSIS — I10 Essential (primary) hypertension: Secondary | ICD-10-CM

## 2018-01-19 DIAGNOSIS — R69 Illness, unspecified: Secondary | ICD-10-CM | POA: Diagnosis not present

## 2018-01-23 LAB — COMPREHENSIVE METABOLIC PANEL
ALT: 48 IU/L — ABNORMAL HIGH (ref 0–44)
AST: 34 IU/L (ref 0–40)
Albumin/Globulin Ratio: 1.7 (ref 1.2–2.2)
Albumin: 4.3 g/dL (ref 3.5–5.5)
Alkaline Phosphatase: 90 IU/L (ref 39–117)
BUN/Creatinine Ratio: 14 (ref 9–20)
BUN: 17 mg/dL (ref 6–24)
Bilirubin Total: 0.5 mg/dL (ref 0.0–1.2)
CO2: 24 mmol/L (ref 20–29)
Calcium: 9.8 mg/dL (ref 8.7–10.2)
Chloride: 96 mmol/L (ref 96–106)
Creatinine, Ser: 1.2 mg/dL (ref 0.76–1.27)
GFR calc Af Amer: 78 mL/min/{1.73_m2} (ref >59)
GFR calc non Af Amer: 67 mL/min/{1.73_m2} (ref >59)
Globulin, Total: 2.5 g/dL (ref 1.5–4.5)
Glucose: 151 mg/dL — ABNORMAL HIGH (ref 65–99)
Potassium: 4.8 mmol/L (ref 3.5–5.2)
Sodium: 135 mmol/L (ref 134–144)
Total Protein: 6.8 g/dL (ref 6.0–8.5)

## 2018-01-23 LAB — HEMOGLOBIN A1C
Est. average glucose Bld gHb Est-mCnc: 123 mg/dL
Hgb A1c MFr Bld: 5.9 % — ABNORMAL HIGH (ref 4.8–5.6)

## 2018-01-26 ENCOUNTER — Other Ambulatory Visit: Payer: Self-pay | Admitting: Family Medicine

## 2018-01-26 ENCOUNTER — Ambulatory Visit (INDEPENDENT_AMBULATORY_CARE_PROVIDER_SITE_OTHER): Payer: Managed Care, Other (non HMO) | Admitting: Family Medicine

## 2018-01-26 ENCOUNTER — Encounter: Payer: Self-pay | Admitting: Family Medicine

## 2018-01-26 VITALS — BP 136/86 | HR 106 | Temp 99.1°F | Resp 16 | Ht 68.0 in | Wt 214.0 lb

## 2018-01-26 DIAGNOSIS — I10 Essential (primary) hypertension: Secondary | ICD-10-CM

## 2018-01-26 DIAGNOSIS — Z0000 Encounter for general adult medical examination without abnormal findings: Secondary | ICD-10-CM

## 2018-01-26 DIAGNOSIS — R7303 Prediabetes: Secondary | ICD-10-CM

## 2018-01-26 DIAGNOSIS — Z125 Encounter for screening for malignant neoplasm of prostate: Secondary | ICD-10-CM

## 2018-01-26 DIAGNOSIS — Z1211 Encounter for screening for malignant neoplasm of colon: Secondary | ICD-10-CM

## 2018-01-26 DIAGNOSIS — F4310 Post-traumatic stress disorder, unspecified: Secondary | ICD-10-CM

## 2018-01-26 DIAGNOSIS — F339 Major depressive disorder, recurrent, unspecified: Secondary | ICD-10-CM

## 2018-01-26 DIAGNOSIS — N522 Drug-induced erectile dysfunction: Secondary | ICD-10-CM

## 2018-01-26 DIAGNOSIS — E559 Vitamin D deficiency, unspecified: Secondary | ICD-10-CM

## 2018-01-26 DIAGNOSIS — Z23 Encounter for immunization: Secondary | ICD-10-CM | POA: Diagnosis not present

## 2018-01-26 DIAGNOSIS — L301 Dyshidrosis [pompholyx]: Secondary | ICD-10-CM | POA: Insufficient documentation

## 2018-01-26 DIAGNOSIS — E782 Mixed hyperlipidemia: Secondary | ICD-10-CM

## 2018-01-26 DIAGNOSIS — R7989 Other specified abnormal findings of blood chemistry: Secondary | ICD-10-CM

## 2018-01-26 DIAGNOSIS — M8949 Other hypertrophic osteoarthropathy, multiple sites: Secondary | ICD-10-CM

## 2018-01-26 DIAGNOSIS — M159 Polyosteoarthritis, unspecified: Secondary | ICD-10-CM

## 2018-01-26 DIAGNOSIS — M150 Primary generalized (osteo)arthritis: Secondary | ICD-10-CM

## 2018-01-26 MED ORDER — LISINOPRIL-HYDROCHLOROTHIAZIDE 20-25 MG PO TABS: 1.0000 | ORAL_TABLET | Freq: Every day | ORAL | 3 refills | Status: DC

## 2018-01-26 MED ORDER — TRAZODONE HCL 100 MG PO TABS: 100.0000 mg | ORAL_TABLET | Freq: Every day | ORAL | 3 refills | Status: DC

## 2018-01-26 MED ORDER — MELOXICAM 15 MG PO TABS: 15.0000 mg | ORAL_TABLET | Freq: Every day | ORAL | 2 refills | Status: DC

## 2018-01-26 MED ORDER — SILDENAFIL CITRATE 20 MG PO TABS: ORAL_TABLET | ORAL | 3 refills | Status: DC

## 2018-01-26 NOTE — Assessment & Plan Note (Signed)
Followed by [facility] Controlled currently

## 2018-01-26 NOTE — Assessment & Plan Note (Addendum)
Controlled HTN - Home BP readings normal  Complication with CKD-II    Plan:  1. Continue current BP regimen - Amlodipine 5mg  daily, Lisinopril-HCTZ - 20-25mg  daily 2. Encourage improved lifestyle - low sodium diet, regular exercise 3. Continue monitor BP outside office, bring readings to next visit, if persistently >140/90 or new symptoms notify office sooner 4. Follow-up 6 months w/ labs annual

## 2018-01-26 NOTE — Patient Instructions (Addendum)
Thank you for coming to the office today.  Keep up the great work with plan to improve sugar / wt  No medicine changes at this time  Flu Shot.  I am comfortable with the EKG heart rhythm. Most likely a result of the event at that time.  If you notice palpitations / fluttering heart or chest pain or short of breath episodes worse with exertion - notify office or seek medical attention immediately.  Colon Cancer Screening: - For all adults age 57+ routine colon cancer screening is highly recommended.     - Recent guidelines from [place] recommend starting age of 7 - Early detection of colon cancer is important, because often there are no warning signs or symptoms, also if found early usually it can be cured. Late stage is hard to treat.  - If you are not interested in Colonoscopy screening (if done and normal you could be cleared for 5 to 10 years until next due), then Cologuard is an excellent alternative for screening test for Colon Cancer. It is highly sensitive for detecting DNA of colon cancer from even the earliest stages. Also, there is NO bowel prep required. - If Cologuard is NEGATIVE, then it is good for 3 years before next due - If Cologuard is POSITIVE, then it is strongly advised to get a Colonoscopy, which allows the GI doctor to locate the source of the cancer or polyp (even very early stage) and treat it by removing it. -------------------------  ORDERED COLOGUARD TODAY   Follow instructions to collect sample, you may call the company for any help or questions, 24/7 telephone support at [phone].  DUE for FASTING BLOOD WORK (no food or drink after midnight before the lab appointment, only water or coffee without cream/sugar on the morning of)  SCHEDULE "Lab Only" visit in the morning at the clinic for lab draw in 6 MONTHS   - Make sure Lab Only appointment is at about 1 week before your next appointment, so that results will be available  For Lab  Results, once available within 2-3 days of blood draw, you can can log in to MyChart online to view your results and a brief explanation. Also, we can discuss results at next follow-up visit.   Please schedule a Follow-up Appointment to: Return in about 6 months (around 07/28/2018) for Annual Physical.  If you have any other questions or concerns, please feel free to call the office or send a message through [place]. You may also schedule an earlier appointment if necessary.  Additionally, you may be receiving a survey about your experience at our office within a few days to 1 week by e-mail or mail. We value your feedback.  Nobie Putnam, DO [place]

## 2018-01-26 NOTE — Assessment & Plan Note (Signed)
Improved A1c from 6.3 down to 5.9, limited lifestyle changes Concern with obesity, HTN, HLD  Plan:  1. Not on any therapy currently  2. Encourage improved lifestyle - low carb, low sugar diet, reduce portion size, continue improving regular exercise 3. Follow-up 6 months annual w/ labs
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
## 2018-01-27 MED ORDER — TRAZODONE HCL 100 MG PO TABS: 100.0000 mg | ORAL_TABLET | Freq: Every day | ORAL | 3 refills | Status: DC

## 2018-01-27 NOTE — Addendum Note (Signed)
Addended by: Olin Hauser on: 01/27/2018 12:24 PM   Modules accepted: Orders

## 2018-02-02 ENCOUNTER — Other Ambulatory Visit: Payer: Self-pay

## 2018-02-02 NOTE — Telephone Encounter (Signed)
Handwritten rx for Trintellix 10mg  DAW 1 tab daily oral, #30, with 1 year refills, Case #[id number]  To be faxed to number in message as requested,for patient assistance program  Jorge Jordan, [facility] 02/02/2018, 5:42 PM

## 2018-02-02 NOTE — Telephone Encounter (Signed)
Patient is in need of a refill of Trentellix 10mg  once daily.  This Rx needs to be faxed to [phone] with Case # [id number] on rx. Patient has been out of meds and needs this asap.

## 2018-02-03 NOTE — Telephone Encounter (Signed)
Rx faxed

## 2018-02-21 LAB — COLOGUARD: Cologuard: NEGATIVE

## 2018-02-26 ENCOUNTER — Encounter: Payer: Self-pay | Admitting: Family Medicine

## 2018-03-03 NOTE — Telephone Encounter (Signed)
Received [employer] Patient Assistance Program application from patient, he has completed his portions. Now completed provider section, and printed a med list to attach with it. Selected Trintellix 10mg  daily option. Signed and dated 03/03/18, called patient he will pick up application tomorrow Weds 11/27 so that he can mail out with other financial documents.  Of note - we faxed a handwritten rx as listed below in prior message 02/02/18 for 1 year supply already. This has been completed.  Nobie Putnam, [facility] 03/03/2018, 5:34 PM

## 2018-03-14 ENCOUNTER — Other Ambulatory Visit: Payer: Self-pay | Admitting: Family Medicine

## 2018-03-14 DIAGNOSIS — F4310 Post-traumatic stress disorder, unspecified: Secondary | ICD-10-CM

## 2018-07-21 ENCOUNTER — Other Ambulatory Visit: Payer: Managed Care, Other (non HMO)

## 2018-07-28 ENCOUNTER — Encounter: Payer: Managed Care, Other (non HMO) | Admitting: Family Medicine

## 2018-09-01 ENCOUNTER — Other Ambulatory Visit: Payer: Self-pay | Admitting: Family Medicine

## 2018-09-01 DIAGNOSIS — E7800 Pure hypercholesterolemia, unspecified: Secondary | ICD-10-CM

## 2018-10-06 ENCOUNTER — Other Ambulatory Visit: Payer: Self-pay | Admitting: Family Medicine

## 2018-10-06 DIAGNOSIS — F4310 Post-traumatic stress disorder, unspecified: Secondary | ICD-10-CM

## 2018-10-14 ENCOUNTER — Telehealth: Payer: Self-pay

## 2018-10-14 DIAGNOSIS — E782 Mixed hyperlipidemia: Secondary | ICD-10-CM

## 2018-10-14 DIAGNOSIS — Z125 Encounter for screening for malignant neoplasm of prostate: Secondary | ICD-10-CM

## 2018-10-14 DIAGNOSIS — R7303 Prediabetes: Secondary | ICD-10-CM

## 2018-10-14 DIAGNOSIS — I10 Essential (primary) hypertension: Secondary | ICD-10-CM

## 2018-10-14 NOTE — Telephone Encounter (Signed)
Signed lab orders.

## 2018-10-15 DIAGNOSIS — R7303 Prediabetes: Secondary | ICD-10-CM | POA: Diagnosis not present

## 2018-10-15 DIAGNOSIS — E782 Mixed hyperlipidemia: Secondary | ICD-10-CM | POA: Diagnosis not present

## 2018-10-15 DIAGNOSIS — Z125 Encounter for screening for malignant neoplasm of prostate: Secondary | ICD-10-CM | POA: Diagnosis not present

## 2018-10-15 DIAGNOSIS — I10 Essential (primary) hypertension: Secondary | ICD-10-CM | POA: Diagnosis not present

## 2018-10-16 LAB — CBC WITH DIFFERENTIAL/PLATELET
Basophils Absolute: 0.1 10*3/uL (ref 0.0–0.2)
Basos: 1 %
EOS (ABSOLUTE): 0.2 10*3/uL (ref 0.0–0.4)
Eos: 3 %
Hematocrit: 47.1 % (ref 37.5–51.0)
Hemoglobin: 16.7 g/dL (ref 13.0–17.7)
Immature Grans (Abs): 0 10*3/uL (ref 0.0–0.1)
Immature Granulocytes: 1 %
Lymphocytes Absolute: 1.4 10*3/uL (ref 0.7–3.1)
Lymphs: 25 %
MCH: 33.8 pg — ABNORMAL HIGH (ref 26.6–33.0)
MCHC: 35.5 g/dL (ref 31.5–35.7)
MCV: 95 fL (ref 79–97)
Monocytes Absolute: 0.7 10*3/uL (ref 0.1–0.9)
Monocytes: 13 %
Neutrophils Absolute: 3.1 10*3/uL (ref 1.4–7.0)
Neutrophils: 57 %
Platelets: 261 10*3/uL (ref 150–450)
RBC: 4.94 x10E6/uL (ref 4.14–5.80)
RDW: 11.8 % (ref 11.6–15.4)
WBC: 5.5 10*3/uL (ref 3.4–10.8)

## 2018-10-16 LAB — COMPREHENSIVE METABOLIC PANEL
ALT: 79 IU/L — ABNORMAL HIGH (ref 0–44)
AST: 46 IU/L — ABNORMAL HIGH (ref 0–40)
Albumin/Globulin Ratio: 2 (ref 1.2–2.2)
Albumin: 4.9 g/dL (ref 3.8–4.9)
Alkaline Phosphatase: 91 IU/L (ref 39–117)
BUN/Creatinine Ratio: 14 (ref 9–20)
BUN: 15 mg/dL (ref 6–24)
Bilirubin Total: 0.7 mg/dL (ref 0.0–1.2)
CO2: 23 mmol/L (ref 20–29)
Calcium: 10.2 mg/dL (ref 8.7–10.2)
Chloride: 95 mmol/L — ABNORMAL LOW (ref 96–106)
Creatinine, Ser: 1.11 mg/dL (ref 0.76–1.27)
GFR calc Af Amer: 85 mL/min/{1.73_m2} (ref >59)
GFR calc non Af Amer: 73 mL/min/{1.73_m2} (ref >59)
Globulin, Total: 2.5 g/dL (ref 1.5–4.5)
Glucose: 143 mg/dL — ABNORMAL HIGH (ref 65–99)
Potassium: 4.9 mmol/L (ref 3.5–5.2)
Sodium: 135 mmol/L (ref 134–144)
Total Protein: 7.4 g/dL (ref 6.0–8.5)

## 2018-10-16 LAB — HEMOGLOBIN A1C
Est. average glucose Bld gHb Est-mCnc: 140 mg/dL
Hgb A1c MFr Bld: 6.5 % — ABNORMAL HIGH (ref 4.8–5.6)

## 2018-10-16 LAB — LIPID PANEL
Chol/HDL Ratio: 3.7 ratio (ref 0.0–5.0)
Cholesterol, Total: 235 mg/dL — ABNORMAL HIGH (ref 100–199)
HDL: 64 mg/dL (ref >39)
LDL Calculated: 146 mg/dL — ABNORMAL HIGH (ref 0–99)
Triglycerides: 126 mg/dL (ref 0–149)
VLDL Cholesterol Cal: 25 mg/dL (ref 5–40)

## 2018-10-16 LAB — PSA: Prostate Specific Ag, Serum: 1.8 ng/mL (ref 0.0–4.0)

## 2018-10-22 ENCOUNTER — Encounter: Payer: Self-pay | Admitting: Family Medicine

## 2018-10-22 ENCOUNTER — Other Ambulatory Visit: Payer: Self-pay

## 2018-10-22 ENCOUNTER — Other Ambulatory Visit: Payer: Self-pay | Admitting: Family Medicine

## 2018-10-22 ENCOUNTER — Ambulatory Visit (INDEPENDENT_AMBULATORY_CARE_PROVIDER_SITE_OTHER): Payer: Managed Care, Other (non HMO) | Admitting: Family Medicine

## 2018-10-22 VITALS — BP 123/74 | HR 107 | Temp 99.2°F | Ht 68.0 in | Wt 207.6 lb

## 2018-10-22 DIAGNOSIS — Z0000 Encounter for general adult medical examination without abnormal findings: Secondary | ICD-10-CM

## 2018-10-22 DIAGNOSIS — R7989 Other specified abnormal findings of blood chemistry: Secondary | ICD-10-CM

## 2018-10-22 DIAGNOSIS — E782 Mixed hyperlipidemia: Secondary | ICD-10-CM

## 2018-10-22 DIAGNOSIS — I10 Essential (primary) hypertension: Secondary | ICD-10-CM

## 2018-10-22 DIAGNOSIS — F339 Major depressive disorder, recurrent, unspecified: Secondary | ICD-10-CM

## 2018-10-22 DIAGNOSIS — N522 Drug-induced erectile dysfunction: Secondary | ICD-10-CM

## 2018-10-22 DIAGNOSIS — R740 Nonspecific elevation of levels of transaminase and lactic acid dehydrogenase [LDH]: Secondary | ICD-10-CM

## 2018-10-22 DIAGNOSIS — R7401 Elevation of levels of liver transaminase levels: Secondary | ICD-10-CM

## 2018-10-22 DIAGNOSIS — Z125 Encounter for screening for malignant neoplasm of prostate: Secondary | ICD-10-CM

## 2018-10-22 DIAGNOSIS — F4310 Post-traumatic stress disorder, unspecified: Secondary | ICD-10-CM

## 2018-10-22 DIAGNOSIS — R7303 Prediabetes: Secondary | ICD-10-CM

## 2018-10-22 MED ORDER — SILDENAFIL CITRATE 20 MG PO TABS: ORAL_TABLET | ORAL | 3 refills | Status: DC

## 2018-10-22 MED ORDER — TRAZODONE HCL 100 MG PO TABS: 100.0000 mg | ORAL_TABLET | Freq: Every day | ORAL | 3 refills | Status: DC

## 2018-10-22 MED ORDER — LISINOPRIL-HYDROCHLOROTHIAZIDE 20-25 MG PO TABS: 1.0000 | ORAL_TABLET | Freq: Every day | ORAL | 3 refills | Status: DC

## 2018-10-22 MED ORDER — AMLODIPINE BESYLATE 5 MG PO TABS: 5.0000 mg | ORAL_TABLET | Freq: Every day | ORAL | 3 refills | Status: DC

## 2018-10-22 NOTE — Assessment & Plan Note (Signed)
Elevated LDL and borderline TG on statin Last lipid panel 10/2018 Calculated ASCVD 10 yr risk score elevated Elevated LFTs may be fatty liver  Plan: 1. Continue current meds - Pravastatin 40mg  daily 2. Continue ASA 81mg  for primary ASCVD risk reduction 3. Encourage improved lifestyle - low carb/cholesterol, reduce portion size, continue improving regular exercise 4. Follow-up 6 repeat lipid  Consider switch statin due to question if effective

## 2018-10-22 NOTE — Assessment & Plan Note (Signed)
Controlled HTN - Home BP readings normal  Complication with CKD-II    Plan:  1. Continue current BP regimen - Amlodipine 5mg  daily, Lisinopril-HCTZ - 20-25mg  daily 2. Encourage improved lifestyle - low sodium diet, regular exercise 3. Continue monitor BP outside office, bring readings to next visit, if persistently >140/90 or new symptoms notify office sooner

## 2018-10-22 NOTE — Assessment & Plan Note (Signed)
Elevated LFTs, less alcohol now less likely Suspect due to cholesterol elevated lipid Repeat LFT in 6 month, improve lifestyle, if elevated order RUQ [place]

## 2018-10-22 NOTE — Progress Notes (Signed)
Subjective:    Patient ID: Jorge Jordan, male    DOB: 01/22/1961, 58 y.o.   MRN: [id number]  Jorge Jordan is a 58 y.o. male presenting on 10/22/2018 for Annual Exam   HPI  Here for Annual Physical and Lab Review.  Elevated A1c -Pre-Diabetes / Obesity BMI >31 Elevated A1c again up to 6.5 now, previous similar to 6.3, but has improved in interval. Meds:None (never on meds) Currently on ACEi Lifestyle: Weight down 7 lbs Diet - no significant changes has tried to improve diet, but not always, still consumes some alcohol occasionally not regularly - Exercise (- Limited exercise due to coronavirus pandemic, he has an above ground pool now with more) - Family history of DM Denies hypoglycemia  CHRONIC HTN: Reports checks BP at homewith normal BP readings on avg still. Current Meds - Lisinopril-HCTZ 20-25mg  daily, Amlodipine 5mg  daily Reports good compliance, took meds today. Tolerating well, w/o complaints. Denies CP, dyspnea, HA, edema, dizziness / lightheadedness  HYPERLIPIDEMIA: - Reports concerns. Last lipid panel 10/2018, elevated LDL up from 118 to 146 - Currently taking Pravastatin 40mg , tolerating well without side effects or myalgias  Elevated LFTs Lab shows elevated ALT and AST.  Major Depression, chronic recurrent / Anxiety + PTSD No longer followed by Psychiatry. - He is tolerating meds well, without concern, see PHQ GAD score below - Continues on Trintellix 10mg  daily, Trazodone 100mg  nightly, Buspirone 10mg  TID  Health Maintenance: UTD Hep C screen UTD Cologuard 02/2018, negative next due 2022 UTD TDap  Depression screen [facility] 2/9 10/22/2018 01/26/2018 07/21/2017  Decreased Interest 0 0 1  Down, Depressed, Hopeless 0 0 0  PHQ - 2 Score 0 0 1  Altered sleeping 0 0 0  Tired, decreased energy 0 0 1  Change in appetite 0 0 1  Feeling bad or failure about yourself  0 0 1  Trouble concentrating 0 0 0  Moving slowly or fidgety/restless 0 0 0  Suicidal  thoughts 0 0 0  PHQ-9 Score 0 0 4  Difficult doing work/chores Not difficult at all Not difficult at all Very difficult   GAD 7 : Generalized Anxiety Score 01/26/2018 07/21/2017 05/30/2015  Nervous, Anxious, on Edge 0 2 3  Control/stop worrying 0 2 3  Worry too much - different things 0 1 3  Trouble relaxing 0 1 3  Restless 0 1 3  Easily annoyed or irritable 0 1 3  Afraid - awful might happen 0 1 3  Total GAD 7 Score 0 9 21  Anxiety Difficulty Not difficult at all Very difficult Somewhat difficult     Past Medical History:  Diagnosis Date  . Allergy   . Anxiety   . Arthritis   . Foot deformity    foot arthrodesis L foot  . Hyperlipidemia   . Osteoporosis   . Shoulder bursitis    both   . Tendinitis    elbow   Past Surgical History:  Procedure Laterality Date  . FOOT SURGERY Left 2001   Triple arthrodesis  . ROTATOR CUFF REPAIR Left   . SHOULDER FUSION SURGERY    . WRIST SURGERY     ganglion cyst   Social History   Socioeconomic History  . Marital status: Married    Spouse name: Not on file  . Number of children: Not on file  . Years of education: Not on file  . Highest education level: Not on file  Occupational History  . Not on file  Social  Needs  . Financial resource strain: Not on file  . Food insecurity    Worry: Not on file    Inability: Not on file  . Transportation needs    Medical: Not on file    Non-medical: Not on file  Tobacco Use  . Smoking status: Never Smoker  . Smokeless tobacco: Never Used  Substance and Sexual Activity  . Alcohol use: No    Comment: Abstaining from alcohol since 01/19/16  . Drug use: No  . Sexual activity: Not on file  Lifestyle  . Physical activity    Days per week: Not on file    Minutes per session: Not on file  . Stress: Not on file  Relationships  . Social Herbalist on phone: Not on file    Gets together: Not on file    Attends religious service: Not on file    Active member of club or  organization: Not on file    Attends meetings of clubs or organizations: Not on file    Relationship status: Not on file  . Intimate partner violence    Fear of current or ex partner: Not on file    Emotionally abused: Not on file    Physically abused: Not on file    Forced sexual activity: Not on file  Other Topics Concern  . Not on file  Social History Narrative  . Not on file   Family History  Problem Relation Age of Onset  . Hyperlipidemia Mother   . Hypertension Mother   . Diabetes Mother   . Depression Mother   . Hypertension Father   . Hyperlipidemia Father   . Cancer Father        skin cancer  . Hypertension Brother   . Diabetes Maternal Grandmother   . Heart attack Maternal Grandfather    Current Outpatient Medications on File Prior to Visit  Medication Sig  . acetaminophen (TYLENOL) 500 MG tablet Take 1,000 mg by mouth every 6 (six) hours as needed.  Marland Kitchen aspirin EC 81 MG tablet Take 81 mg by mouth daily.  Marland Kitchen augmented betamethasone dipropionate (DIPROLENE-AF) 0.05 % cream   . busPIRone (BUSPAR) 10 MG tablet TAKE ONE TABLET BY MOUTH THREE TIMES A DAY  . pravastatin (PRAVACHOL) 40 MG tablet TAKE ONE TABLET BY MOUTH DAILY  . vortioxetine HBr (TRINTELLIX) 10 MG TABS tablet Take 1 tablet (10 mg total) by mouth daily.  . meloxicam (MOBIC) 15 MG tablet Take 1 tablet (15 mg total) by mouth daily. For up to 1-2 weeks as needed (Patient not taking: Reported on 10/22/2018)   No current facility-administered medications on file prior to visit.     Review of Systems  Constitutional: Negative for activity change, appetite change, chills, diaphoresis, fatigue and fever.  HENT: Negative for congestion and hearing loss.   Eyes: Negative for visual disturbance.  Respiratory: Negative for cough, chest tightness, shortness of breath and wheezing.   Cardiovascular: Negative for chest pain, palpitations and leg swelling.  Gastrointestinal: Negative for abdominal pain, constipation,  diarrhea, nausea and vomiting.  Genitourinary: Negative for dysuria, frequency and hematuria.  Musculoskeletal: Negative for arthralgias and neck pain.  Skin: Negative for rash.  Neurological: Negative for dizziness, weakness, light-headedness, numbness and headaches.  Hematological: Negative for adenopathy.  Psychiatric/Behavioral: Negative for behavioral problems, dysphoric mood and sleep disturbance.   Per HPI unless specifically indicated above      Objective:    BP 123/74 (BP Location: Left Arm, Patient Position:  Sitting, Cuff Size: Normal)   Pulse (!) 107   Temp 99.2 F (37.3 C) (Oral)   Ht 5\' 8"  (1.727 m)   Wt 207 lb 9.6 oz (94.2 kg)   BMI 31.57 kg/m   Wt Readings from Last 3 Encounters:  10/22/18 207 lb 9.6 oz (94.2 kg)  01/26/18 214 lb (97.1 kg)  07/21/17 210 lb 9.6 oz (95.5 kg)    Physical Exam Vitals signs and nursing note reviewed.  Constitutional:      General: He is not in acute distress.    Appearance: He is well-developed. He is not diaphoretic.     Comments: Well-appearing, comfortable, cooperative  HENT:     Head: Normocephalic and atraumatic.     Right Ear: Tympanic membrane and ear canal normal.     Left Ear: Tympanic membrane and ear canal normal.  Eyes:     General:        Right eye: No discharge.        Left eye: No discharge.     Conjunctiva/sclera: Conjunctivae normal.     Pupils: Pupils are equal, round, and reactive to light.  Neck:     Musculoskeletal: Normal range of motion and neck supple.     Thyroid: No thyromegaly.  Cardiovascular:     Rate and Rhythm: Normal rate and regular rhythm.     Heart sounds: Normal heart sounds. No murmur.  Pulmonary:     Effort: Pulmonary effort is normal. No respiratory distress.     Breath sounds: Normal breath sounds. No wheezing or rales.  Abdominal:     General: Bowel sounds are normal. There is no distension.     Palpations: Abdomen is soft. There is no mass.     Tenderness: There is no abdominal  tenderness.  Musculoskeletal: Normal range of motion.        General: No tenderness.     Comments: Upper / Lower Extremities: - Normal muscle tone, strength bilateral upper extremities 5/5, lower extremities 5/5  Lymphadenopathy:     Cervical: No cervical adenopathy.  Skin:    General: Skin is warm and dry.     Findings: No erythema or rash.  Neurological:     Mental Status: He is alert and oriented to person, place, and time.     Comments: Distal sensation intact to light touch all extremities  Psychiatric:        Behavior: Behavior normal.     Comments: Well groomed, good eye contact, normal speech and thoughts      Recent Labs    01/22/18 0823 10/15/18 1000  HGBA1C 5.9* 6.5*     Results for orders placed or performed in visit on 10/22/18  PSA  Result Value Ref Range   Prostate Specific Ag, Serum 1.8 0.0 - 4.0 ng/mL  Hemoglobin A1c  Result Value Ref Range   Hgb A1c MFr Bld 6.5 (H) 4.8 - 5.6 %   Est. average glucose Bld gHb Est-mCnc 140 mg/dL  CBC w/Diff/Platelet  Result Value Ref Range   WBC 5.5 3.4 - 10.8 x10E3/uL   RBC 4.94 4.14 - 5.80 x10E6/uL   Hemoglobin 16.7 13.0 - 17.7 g/dL   Hematocrit 47.1 37.5 - 51.0 %   MCV 95 79 - 97 fL   MCH 33.8 (H) 26.6 - 33.0 pg   MCHC 35.5 31.5 - 35.7 g/dL   RDW 11.8 11.6 - 15.4 %   Platelets 261 150 - 450 x10E3/uL   Neutrophils 57 Not Estab. %  Lymphs 25 Not Estab. %   Monocytes 13 Not Estab. %   Eos 3 Not Estab. %   Basos 1 Not Estab. %   Neutrophils Absolute 3.1 1.4 - 7.0 x10E3/uL   Lymphocytes Absolute 1.4 0.7 - 3.1 x10E3/uL   Monocytes Absolute 0.7 0.1 - 0.9 x10E3/uL   EOS (ABSOLUTE) 0.2 0.0 - 0.4 x10E3/uL   Basophils Absolute 0.1 0.0 - 0.2 x10E3/uL   Immature Granulocytes 1 Not Estab. %   Immature Grans (Abs) 0.0 0.0 - 0.1 x10E3/uL  Comprehensive metabolic panel  Result Value Ref Range   Glucose 143 (H) 65 - 99 mg/dL   BUN 15 6 - 24 mg/dL   Creatinine, Ser 1.11 0.76 - 1.27 mg/dL   GFR calc non Af Amer 73 >59  mL/min/1.73   GFR calc Af Amer 85 >59 mL/min/1.73   BUN/Creatinine Ratio 14 9 - 20   Sodium 135 134 - 144 mmol/L   Potassium 4.9 3.5 - 5.2 mmol/L   Chloride 95 (L) 96 - 106 mmol/L   CO2 23 20 - 29 mmol/L   Calcium 10.2 8.7 - 10.2 mg/dL   Total Protein 7.4 6.0 - 8.5 g/dL   Albumin 4.9 3.8 - 4.9 g/dL   Globulin, Total 2.5 1.5 - 4.5 g/dL   Albumin/Globulin Ratio 2.0 1.2 - 2.2   Bilirubin Total 0.7 0.0 - 1.2 mg/dL   Alkaline Phosphatase 91 39 - 117 IU/L   AST 46 (H) 0 - 40 IU/L   ALT 79 (H) 0 - 44 IU/L  Lipid panel  Result Value Ref Range   Cholesterol, Total 235 (H) 100 - 199 mg/dL   Triglycerides 126 0 - 149 mg/dL   HDL 64 >39 mg/dL   VLDL Cholesterol Cal 25 5 - 40 mg/dL   LDL Calculated 146 (H) 0 - 99 mg/dL   Chol/HDL Ratio 3.7 0.0 - 5.0 ratio      Assessment & Plan:   Problem List Items Addressed This Visit    Elevated transaminase level    Elevated LFTs, less alcohol now less likely Suspect due to cholesterol elevated lipid Repeat LFT in 6 month, improve lifestyle, if elevated order RUQ [place]      Essential hypertension    Controlled HTN - Home BP readings normal  Complication with CKD-II    Plan:  1. Continue current BP regimen - Amlodipine 5mg  daily, Lisinopril-HCTZ - 20-25mg  daily 2. Encourage improved lifestyle - low sodium diet, regular exercise 3. Continue monitor BP outside office, bring readings to next visit, if persistently >140/90 or new symptoms notify office sooner      Relevant Medications   sildenafil (REVATIO) 20 MG tablet   amLODipine (NORVASC) 5 MG tablet   lisinopril-hydrochlorothiazide (ZESTORETIC) 20-25 MG tablet   Hyperlipidemia    Elevated LDL and borderline TG on statin Last lipid panel 10/2018 Calculated ASCVD 10 yr risk score elevated Elevated LFTs may be fatty liver  Plan: 1. Continue current meds - Pravastatin 40mg  daily 2. Continue ASA 81mg  for primary ASCVD risk reduction 3. Encourage improved lifestyle - low carb/cholesterol,  reduce portion size, continue improving regular exercise 4. Follow-up 6 repeat lipid  Consider switch statin due to question if effective      Relevant Medications   sildenafil (REVATIO) 20 MG tablet   amLODipine (NORVASC) 5 MG tablet   lisinopril-hydrochlorothiazide (ZESTORETIC) 20-25 MG tablet   Major depressive disorder, recurrent episode with anxious distress (HCC)    Controlled depression anxiety PTSD No longer followed by Psych  Continues on Trintellix (financial asst from manufacturer, Buspirone, Trazodone      Relevant Medications   traZODone (DESYREL) 100 MG tablet   Pre-diabetes    Elevated A1c from 5.9 to 6.5 again, limited lifestyle changes Concern with obesity, HTN, HLD  Plan:  1. Not on any therapy currently  - discussed possible metformin, agree to defer and start aggressive lifestyle intervention 2. Encourage improved lifestyle - low carb, low sugar diet, reduce portion size, continue improving regular exercise 3. Follow-up 6 months w/ repeat lab      PTSD (post-traumatic stress disorder)    See A&P Depression      Relevant Medications   traZODone (DESYREL) 100 MG tablet   Screening for prostate cancer    Other Visit Diagnoses    Annual physical exam    -  Primary   Drug-induced erectile dysfunction       Relevant Medications   sildenafil (REVATIO) 20 MG tablet       Updated Health Maintenance information Reviewed recent lab results with patient Encouraged improvement to lifestyle with diet and exercise - Goal of weight loss   Meds ordered this encounter  Medications  . sildenafil (REVATIO) 20 MG tablet    Sig: Take 3-5 tablets as needed 30 prior to sex.    Dispense:  30 tablet    Refill:  3  . amLODipine (NORVASC) 5 MG tablet    Sig: Take 1 tablet (5 mg total) by mouth daily.    Dispense:  90 tablet    Refill:  3  . lisinopril-hydrochlorothiazide (ZESTORETIC) 20-25 MG tablet    Sig: Take 1 tablet by mouth daily.    Dispense:  90 tablet     Refill:  3  . traZODone (DESYREL) 100 MG tablet    Sig: Take 1 tablet (100 mg total) by mouth at bedtime.    Dispense:  90 tablet    Refill:  3    Follow up plan: Return in about 6 months (around 04/24/2019) for 6 month follow-up PreDM, Cholesterol, LFT lab results.   Future labs [facility] CMET, A1c, Lipid - 6 months, elevated LFT, elevated A1c, LDL - ordered and handed order req to patient.  Nobie Putnam, [facility] 10/22/2018, 8:27 AM

## 2018-10-22 NOTE — Assessment & Plan Note (Signed)
Controlled depression anxiety PTSD No longer followed by Psych Continues on Trintellix (financial asst from manufacturer, Buspirone, Trazodone

## 2018-10-22 NOTE — Assessment & Plan Note (Signed)
See A&P Depression

## 2018-10-22 NOTE — Assessment & Plan Note (Signed)
Elevated A1c from 5.9 to 6.5 again, limited lifestyle changes Concern with obesity, HTN, HLD  Plan:  1. Not on any therapy currently  - discussed possible metformin, agree to defer and start aggressive lifestyle intervention 2. Encourage improved lifestyle - low carb, low sugar diet, reduce portion size, continue improving regular exercise 3. Follow-up 6 months w/ repeat lab

## 2018-10-22 NOTE — Patient Instructions (Addendum)
Thank you for coming to the office today.  Elevated cholesterol LDL, A1c and some liver enzymes  Repeat labs in 6 months at [place]  If still issues, we can consider the following  Liver Ultrasound Start low dose Metformin for sugar Switch Pravastatin to Rosuvastatin  [facility] orders placed, can get in 6 months.  Please schedule a Follow-up Appointment to: Return in about 6 months (around 04/24/2019) for 6 month follow-up PreDM, Cholesterol, LFT lab results.  If you have any other questions or concerns, please feel free to call the office or send a message through [place]. You may also schedule an earlier appointment if necessary.  Additionally, you may be receiving a survey about your experience at our office within a few days to 1 week by e-mail or mail. We value your feedback.  Nobie Putnam, DO [place]

## 2019-01-18 DIAGNOSIS — D2261 Melanocytic nevi of right upper limb, including shoulder: Secondary | ICD-10-CM | POA: Diagnosis not present

## 2019-01-18 DIAGNOSIS — D2262 Melanocytic nevi of left upper limb, including shoulder: Secondary | ICD-10-CM | POA: Diagnosis not present

## 2019-01-18 DIAGNOSIS — X32XXXA Exposure to sunlight, initial encounter: Secondary | ICD-10-CM | POA: Diagnosis not present

## 2019-01-18 DIAGNOSIS — L570 Actinic keratosis: Secondary | ICD-10-CM | POA: Diagnosis not present

## 2019-01-18 DIAGNOSIS — D2272 Melanocytic nevi of left lower limb, including hip: Secondary | ICD-10-CM | POA: Diagnosis not present

## 2019-01-18 DIAGNOSIS — D225 Melanocytic nevi of trunk: Secondary | ICD-10-CM | POA: Diagnosis not present

## 2019-01-18 DIAGNOSIS — D2271 Melanocytic nevi of right lower limb, including hip: Secondary | ICD-10-CM | POA: Diagnosis not present

## 2019-01-18 DIAGNOSIS — L821 Other seborrheic keratosis: Secondary | ICD-10-CM | POA: Diagnosis not present

## 2019-01-25 DIAGNOSIS — H60331 Swimmer's ear, right ear: Secondary | ICD-10-CM | POA: Diagnosis not present

## 2019-01-25 DIAGNOSIS — R69 Illness, unspecified: Secondary | ICD-10-CM | POA: Diagnosis not present

## 2019-01-28 DIAGNOSIS — H60331 Swimmer's ear, right ear: Secondary | ICD-10-CM | POA: Diagnosis not present

## 2019-02-03 ENCOUNTER — Encounter: Payer: Self-pay | Admitting: Family Medicine

## 2019-02-03 NOTE — Progress Notes (Signed)
Completed form from [employer] for financial assistance for Trintellix.  Allergies None Med list - attached copy Selected Trintellix - dose 10 mg - take one daily PO - 90 day +3 refills  Ship to patient  Signed  Nobie Putnam, [facility] 02/03/2019, 5:32 PM

## 2019-02-11 DIAGNOSIS — H60331 Swimmer's ear, right ear: Secondary | ICD-10-CM | POA: Diagnosis not present

## 2019-03-07 DIAGNOSIS — R69 Illness, unspecified: Secondary | ICD-10-CM | POA: Diagnosis not present

## 2019-03-26 ENCOUNTER — Other Ambulatory Visit: Payer: Self-pay | Admitting: Family Medicine

## 2019-03-26 DIAGNOSIS — F4310 Post-traumatic stress disorder, unspecified: Secondary | ICD-10-CM

## 2019-04-21 DIAGNOSIS — R7303 Prediabetes: Secondary | ICD-10-CM | POA: Diagnosis not present

## 2019-04-21 DIAGNOSIS — R945 Abnormal results of liver function studies: Secondary | ICD-10-CM | POA: Diagnosis not present

## 2019-04-21 DIAGNOSIS — E782 Mixed hyperlipidemia: Secondary | ICD-10-CM | POA: Diagnosis not present

## 2019-04-22 LAB — COMPREHENSIVE METABOLIC PANEL
ALT: 74 IU/L — ABNORMAL HIGH (ref 0–44)
AST: 50 IU/L — ABNORMAL HIGH (ref 0–40)
Albumin/Globulin Ratio: 1.5 (ref 1.2–2.2)
Albumin: 4.6 g/dL (ref 3.8–4.9)
Alkaline Phosphatase: 94 IU/L (ref 39–117)
BUN/Creatinine Ratio: 14 (ref 9–20)
BUN: 15 mg/dL (ref 6–24)
Bilirubin Total: 0.7 mg/dL (ref 0.0–1.2)
CO2: 24 mmol/L (ref 20–29)
Calcium: 10.1 mg/dL (ref 8.7–10.2)
Chloride: 94 mmol/L — ABNORMAL LOW (ref 96–106)
Creatinine, Ser: 1.09 mg/dL (ref 0.76–1.27)
GFR calc Af Amer: 86 mL/min/{1.73_m2} (ref >59)
GFR calc non Af Amer: 74 mL/min/{1.73_m2} (ref >59)
Globulin, Total: 3 g/dL (ref 1.5–4.5)
Glucose: 151 mg/dL — ABNORMAL HIGH (ref 65–99)
Potassium: 4 mmol/L (ref 3.5–5.2)
Sodium: 134 mmol/L (ref 134–144)
Total Protein: 7.6 g/dL (ref 6.0–8.5)

## 2019-04-22 LAB — LIPID PANEL
Chol/HDL Ratio: 3.7 ratio (ref 0.0–5.0)
Cholesterol, Total: 213 mg/dL — ABNORMAL HIGH (ref 100–199)
HDL: 57 mg/dL
LDL Chol Calc (NIH): 110 mg/dL — ABNORMAL HIGH (ref 0–99)
Triglycerides: 269 mg/dL — ABNORMAL HIGH (ref 0–149)
VLDL Cholesterol Cal: 46 mg/dL — ABNORMAL HIGH (ref 5–40)

## 2019-04-22 LAB — HEMOGLOBIN A1C
Est. average glucose Bld gHb Est-mCnc: 126 mg/dL
Hgb A1c MFr Bld: 6 % — ABNORMAL HIGH (ref 4.8–5.6)

## 2019-04-26 ENCOUNTER — Other Ambulatory Visit: Payer: Self-pay | Admitting: Family Medicine

## 2019-04-26 ENCOUNTER — Encounter: Payer: Self-pay | Admitting: Family Medicine

## 2019-04-26 ENCOUNTER — Other Ambulatory Visit: Payer: Self-pay

## 2019-04-26 ENCOUNTER — Ambulatory Visit (INDEPENDENT_AMBULATORY_CARE_PROVIDER_SITE_OTHER): Payer: Medicare Other | Admitting: Family Medicine

## 2019-04-26 VITALS — BP 111/67 | HR 102 | Temp 99.2°F | Resp 16 | Ht 68.0 in | Wt 214.0 lb

## 2019-04-26 DIAGNOSIS — R7401 Elevation of levels of liver transaminase levels: Secondary | ICD-10-CM

## 2019-04-26 DIAGNOSIS — E669 Obesity, unspecified: Secondary | ICD-10-CM

## 2019-04-26 DIAGNOSIS — I10 Essential (primary) hypertension: Secondary | ICD-10-CM

## 2019-04-26 DIAGNOSIS — F339 Major depressive disorder, recurrent, unspecified: Secondary | ICD-10-CM | POA: Diagnosis not present

## 2019-04-26 DIAGNOSIS — E663 Overweight: Secondary | ICD-10-CM | POA: Insufficient documentation

## 2019-04-26 DIAGNOSIS — R7303 Prediabetes: Secondary | ICD-10-CM

## 2019-04-26 DIAGNOSIS — E66811 Obesity, class 1: Secondary | ICD-10-CM

## 2019-04-26 DIAGNOSIS — F4310 Post-traumatic stress disorder, unspecified: Secondary | ICD-10-CM

## 2019-04-26 DIAGNOSIS — E782 Mixed hyperlipidemia: Secondary | ICD-10-CM

## 2019-04-26 DIAGNOSIS — Z125 Encounter for screening for malignant neoplasm of prostate: Secondary | ICD-10-CM

## 2019-04-26 DIAGNOSIS — Z0000 Encounter for general adult medical examination without abnormal findings: Secondary | ICD-10-CM

## 2019-04-26 MED ORDER — BUSPIRONE HCL 10 MG PO TABS: 10.0000 mg | ORAL_TABLET | Freq: Three times a day (TID) | ORAL | 1 refills | Status: DC

## 2019-04-26 NOTE — Assessment & Plan Note (Signed)
Still persistent elevated LFTs, not worsening Admits some alcohol over holidays Still abnormal cholesterol  Order RUQ Liver US today, to be scheduled F/u lipids LFT in 6 months

## 2019-04-26 NOTE — Assessment & Plan Note (Signed)
See A&P Depression

## 2019-04-26 NOTE — Assessment & Plan Note (Signed)
Encourage lifestyle diet exercise wt loss 

## 2019-04-26 NOTE — Assessment & Plan Note (Signed)
Improved but still elevated A1c from 6.5 down to 6.0 Concern with obesity, HTN, HLD  Plan:  1. Not on any therapy currently  - discussed possible metformin, agree to defer and start aggressive lifestyle intervention - since he is already improving. 2. Encourage improved lifestyle - low carb, low sugar diet, reduce portion size, continue improving regular exercise 3. Follow-up 6 months w/ repeat lab

## 2019-04-26 NOTE — Assessment & Plan Note (Signed)
Controlled HTN - Home BP readings normal  Complication with CKD-II    Plan:  1. Continue current BP regimen - Amlodipine 5mg  daily, Lisinopril-HCTZ - 20-25mg  daily 2. Encourage improved lifestyle - low sodium diet, regular exercise 3. Continue monitor BP outside office, bring readings to next visit, if persistently >140/90 or new symptoms notify office sooner

## 2019-04-26 NOTE — Assessment & Plan Note (Signed)
Controlled depression anxiety PTSD No longer followed by Psych Continues on Trintellix (financial asst from manufacturer, Buspirone, Trazodone

## 2019-04-26 NOTE — Assessment & Plan Note (Signed)
Improved LDL. Elevated TG On statin Last lipid panel 04/2019 Calculated ASCVD 10 yr risk score elevated Elevated LFTs may be fatty liver - persistent issue  Plan: 1. Continue current meds - Pravastatin 40mg  daily 2. Continue ASA 81mg  for primary ASCVD risk reduction 3. Encourage improved lifestyle - low carb/cholesterol, reduce portion size, continue improving regular exercise 4. Follow-up 6 repeat lipid  ADD RUQ Liver US today to be scheduled

## 2019-04-26 NOTE — Patient Instructions (Signed)
Thank you for coming to the office today.  Stay tuned for RUQ Abdominal Ultrasound for Liver evaluation, still mild elevated Liver Enzymes  Recent Labs    10/15/18 1000 04/21/19 1028  HGBA1C 6.5* 6.0*   Keep improving lifestyle diet / exercise for blood sugar. No new medicine today, reconsider metformin in the future.  Trintellix should be good for 1 year following 02/03/19.  Refilled Buspar  DUE for FASTING BLOOD WORK (no food or drink after midnight before the lab appointment, only water or coffee without cream/sugar on the morning of)  SCHEDULE "Lab Only" visit in the morning at the clinic for lab draw in 6 MONTHS   - Make sure Lab Only appointment is at about 1 week before your next appointment, so that results will be available  For Lab Results, once available within 2-3 days of blood draw, you can can log in to MyChart online to view your results and a brief explanation. Also, we can discuss results at next follow-up visit.   Please schedule a Follow-up Appointment to: No follow-ups on file.  If you have any other questions or concerns, please feel free to call the office or send a message through [place]. You may also schedule an earlier appointment if necessary.  Additionally, you may be receiving a survey about your experience at our office within a few days to 1 week by e-mail or mail. We value your feedback.  Nobie Putnam, DO [place]

## 2019-04-26 NOTE — Progress Notes (Signed)
Subjective:    Patient ID: Jorge Jordan, male    DOB: 1960/10/27, 59 y.o.   MRN: [id number]  Jorge Jordan is a 59 y.o. male presenting on 04/26/2019 for PreDiabetes and Hyperlipidemia   HPI  Elevated A1c -Pre-Diabetes / Obesity BMI >32 Interval update, his A1c has dramatically improved from 6.5 down to 6.0 now Meds:None (never on meds) Currently on ACEi Lifestyle: Weight up from previous but stable from >1 year ago Diet - no significant changes has tried to improve diet, but not always, still consumes some alcohol occasionally not regularly - he admits alcohol intake over holidays otherwise limited since. - Exercise (Limited exercise due to coronavirus pandemic, he has an above ground pool now with more) - Family history of DM Denies hypoglycemia  CHRONIC HTN: Reports checks BP at homewith normal BP readings on avgstill. Current Meds - Lisinopril-HCTZ 20-25mg  daily, Amlodipine 5mg  daily Reports good compliance, took meds today. Tolerating well, w/o complaints. Denies CP, dyspnea, HA, edema, dizziness / lightheadedness  HYPERLIPIDEMIA: - Reports concerns. Last lipid panel 04/2019 improved LDL from 146 down to 110, reduced HDL from 64 to 57, significantly elevated TG from 126 to 269. - Currently taking Pravastatin 40mg , tolerating well without side effects or myalgias  Elevated LFTs Lab shows elevated ALT and AST. Similar to previous, 10/2018, had AST 46 up to 50, now and ALT 79 to 74 now. No new symptoms or concerns.  Major Depression, chronic recurrent / Anxiety + PTSD No longer followed by Psychiatry. - He is tolerating meds well, without concern, see PHQ GAD score below - Continues on Trintellix 10mg  daily, Trazodone 100mg  nightly, Buspirone 10mg  TID Last Trintellix rx authorized on his manufacturer discount was [id number] - completed application, he has still be receiving med now in 2021, no new concerns.   Health Maintenance: UTD Flu Vaccine [facility]  02/08/19  Depression screen [facility] 2/9 04/26/2019 10/22/2018 01/26/2018  Decreased Interest 0 0 0  Down, Depressed, Hopeless 0 0 0  PHQ - 2 Score 0 0 0  Altered sleeping 0 0 0  Tired, decreased energy 1 0 0  Change in appetite 0 0 0  Feeling bad or failure about yourself  0 0 0  Trouble concentrating 0 0 0  Moving slowly or fidgety/restless 0 0 0  Suicidal thoughts 0 0 0  PHQ-9 Score 1 0 0  Difficult doing work/chores Not difficult at all Not difficult at all Not difficult at all    Social History   Tobacco Use  . Smoking status: Never Smoker  . Smokeless tobacco: Never Used  Substance Use Topics  . Alcohol use: No    Comment: Abstaining from alcohol since 01/19/16  . Drug use: No    Review of Systems Per HPI unless specifically indicated above     Objective:    BP 111/67   Pulse (!) 102   Temp 99.2 F (37.3 C) (Oral)   Resp 16   Ht 5\' 8"  (1.727 m)   Wt 214 lb (97.1 kg)   BMI 32.54 kg/m   Wt Readings from Last 3 Encounters:  04/26/19 214 lb (97.1 kg)  10/22/18 207 lb 9.6 oz (94.2 kg)  01/26/18 214 lb (97.1 kg)    Physical Exam Vitals and nursing note reviewed.  Constitutional:      General: He is not in acute distress.    Appearance: He is well-developed. He is not diaphoretic.     Comments: Well-appearing, comfortable, cooperative  HENT:  Head: Normocephalic and atraumatic.  Eyes:     General:        Right eye: No discharge.        Left eye: No discharge.     Conjunctiva/sclera: Conjunctivae normal.  Cardiovascular:     Rate and Rhythm: Normal rate.  Pulmonary:     Effort: Pulmonary effort is normal.  Skin:    General: Skin is warm and dry.     Findings: No erythema or rash.  Neurological:     Mental Status: He is alert and oriented to person, place, and time.  Psychiatric:        Behavior: Behavior normal.     Comments: Well groomed, good eye contact, normal speech and thoughts    Results for orders placed or performed in visit on 10/22/18    Comprehensive metabolic panel  Result Value Ref Range   Glucose 151 (H) 65 - 99 mg/dL   BUN 15 6 - 24 mg/dL   Creatinine, Ser 1.09 0.76 - 1.27 mg/dL   GFR calc non Af Amer 74 >59 mL/min/1.73   GFR calc Af Amer 86 >59 mL/min/1.73   BUN/Creatinine Ratio 14 9 - 20   Sodium 134 134 - 144 mmol/L   Potassium 4.0 3.5 - 5.2 mmol/L   Chloride 94 (L) 96 - 106 mmol/L   CO2 24 20 - 29 mmol/L   Calcium 10.1 8.7 - 10.2 mg/dL   Total Protein 7.6 6.0 - 8.5 g/dL   Albumin 4.6 3.8 - 4.9 g/dL   Globulin, Total 3.0 1.5 - 4.5 g/dL   Albumin/Globulin Ratio 1.5 1.2 - 2.2   Bilirubin Total 0.7 0.0 - 1.2 mg/dL   Alkaline Phosphatase 94 39 - 117 IU/L   AST 50 (H) 0 - 40 IU/L   ALT 74 (H) 0 - 44 IU/L  Lipid panel  Result Value Ref Range   Cholesterol, Total 213 (H) 100 - 199 mg/dL   Triglycerides 269 (H) 0 - 149 mg/dL   HDL 57 >39 mg/dL   VLDL Cholesterol Cal 46 (H) 5 - 40 mg/dL   LDL Chol Calc (NIH) 110 (H) 0 - 99 mg/dL   Chol/HDL Ratio 3.7 0.0 - 5.0 ratio  Hemoglobin A1c  Result Value Ref Range   Hgb A1c MFr Bld 6.0 (H) 4.8 - 5.6 %   Est. average glucose Bld gHb Est-mCnc 126 mg/dL      Assessment & Plan:   Problem List Items Addressed This Visit    PTSD (post-traumatic stress disorder)    See A&P Depression      Relevant Medications   busPIRone (BUSPAR) 10 MG tablet   Pre-diabetes    Improved but still elevated A1c from 6.5 down to 6.0 Concern with obesity, HTN, HLD  Plan:  1. Not on any therapy currently  - discussed possible metformin, agree to defer and start aggressive lifestyle intervention - since he is already improving. 2. Encourage improved lifestyle - low carb, low sugar diet, reduce portion size, continue improving regular exercise 3. 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 Elevated TG On statin Last lipid panel 04/2019 Calculated ASCVD 10 yr risk score elevated Elevated LFTs may be fatty liver - persistent issue  Plan: 1. Continue current meds - Pravastatin 40mg  daily 2. Continue ASA 81mg  for primary ASCVD risk reduction 3. Encourage improved lifestyle - low carb/cholesterol, reduce portion size, continue improving regular exercise 4. Follow-up 6 repeat lipid  ADD RUQ Liver US today to be scheduled      Relevant Orders   US Abdomen Limited RUQ   Essential hypertension    Controlled HTN - Home BP readings normal  Complication with CKD-II    Plan:  1. Continue current BP regimen - Amlodipine 5mg  daily, Lisinopril-HCTZ - 20-25mg  daily 2. Encourage improved lifestyle - low sodium diet, regular exercise 3. Continue monitor BP outside office, bring readings to next visit, if persistently >140/90 or new symptoms notify office sooner      Elevated transaminase level    Still persistent elevated LFTs, not worsening Admits some alcohol over holidays Still abnormal cholesterol  Order RUQ Liver US today, to be scheduled F/u lipids LFT in 6 months      Relevant Orders   US Abdomen Limited RUQ      Orders Placed This Encounter  Procedures  . US Abdomen Limited RUQ    Standing Status:   Future    Standing Expiration Date:   06/23/2020    Order Specific Question:   Reason for Exam (SYMPTOM  OR DIAGNOSIS REQUIRED)    Answer:   persistent elevated LFTs, high cholesterol    Order Specific Question:   Preferred imaging location?    Answer:   [facility] ordered this encounter  Medications  . busPIRone (BUSPAR) 10 MG tablet    Sig: Take 1 tablet (10 mg total) by mouth 3 (three) times daily.    Dispense:  270 tablet    Refill:  1      Follow up plan: Return in about 6 months (around 10/24/2019) for Annual Physical ([facility]).   [facility] Future labs ordered for 10/2019  Nobie Putnam, [facility] 04/26/2019, 9:42 AM

## 2019-04-28 ENCOUNTER — Other Ambulatory Visit: Payer: Self-pay

## 2019-04-29 ENCOUNTER — Other Ambulatory Visit: Payer: Self-pay

## 2019-04-29 ENCOUNTER — Encounter: Payer: Self-pay | Admitting: Family Medicine

## 2019-04-29 ENCOUNTER — Ambulatory Visit
Admission: RE | Admit: 2019-04-29 | Discharge: 2019-04-29 | Disposition: A | Discharge status: Home / Self Care | Payer: Medicare Other | Source: Ambulatory Visit | Attending: Family Medicine | Admitting: Family Medicine

## 2019-04-29 DIAGNOSIS — K7689 Other specified diseases of liver: Secondary | ICD-10-CM | POA: Diagnosis not present

## 2019-04-29 DIAGNOSIS — E782 Mixed hyperlipidemia: Secondary | ICD-10-CM | POA: Diagnosis not present

## 2019-04-29 DIAGNOSIS — K760 Fatty (change of) liver, not elsewhere classified: Secondary | ICD-10-CM | POA: Insufficient documentation

## 2019-04-29 DIAGNOSIS — R7401 Elevation of levels of liver transaminase levels: Secondary | ICD-10-CM | POA: Diagnosis not present

## 2019-06-04 ENCOUNTER — Other Ambulatory Visit: Payer: Self-pay | Admitting: Family Medicine

## 2019-06-04 DIAGNOSIS — E7800 Pure hypercholesterolemia, unspecified: Secondary | ICD-10-CM

## 2019-08-31 ENCOUNTER — Ambulatory Visit (INDEPENDENT_AMBULATORY_CARE_PROVIDER_SITE_OTHER): Payer: Medicare Other

## 2019-08-31 ENCOUNTER — Other Ambulatory Visit: Payer: Self-pay

## 2019-08-31 VITALS — BP 121/71 | HR 100 | Temp 97.5°F | Ht 68.0 in | Wt 205.6 lb

## 2019-08-31 DIAGNOSIS — Z0000 Encounter for general adult medical examination without abnormal findings: Secondary | ICD-10-CM | POA: Diagnosis not present

## 2019-08-31 NOTE — Patient Instructions (Signed)
Mr. Jorge Jordan , Thank you for taking time to come for your Medicare Wellness Visit. I appreciate your ongoing commitment to your health goals. Please review the following plan we discussed and let me know if I can assist you in the future.   Screening recommendations/referrals: Colonoscopy: cologuard completed 02/2018, due 02/2021 Recommended yearly ophthalmology/optometry visit for glaucoma screening and checkup Recommended yearly dental visit for hygiene and checkup  Vaccinations: Influenza vaccine: due 12/2019 Pneumococcal vaccine: not indicated until 65 Tdap vaccine: up to date  Shingles vaccine: shingrix eligible, check with pharmacy for coverage    Covid-19: completed   Advanced directives: Advance directive discussed with you today. I have provided a copy for you to complete at home and have notarized. Once this is complete please bring a copy in to our office so we can scan it into your chart.  Conditions/risks identified: discussed chronic care management, Eula Fried will be calling you to discuss counseling options.   Next appointment: Follow up in one year for your annual wellness visit.   Preventive Care 40-64 Years, Male Preventive care refers to lifestyle choices and visits with your health care provider that can promote health and wellness. What does preventive care include?  A yearly physical exam. This is also called an annual well check.  Dental exams once or twice a year.  Routine eye exams. Ask your health care provider how often you should have your eyes checked.  Personal lifestyle choices, including:  Daily care of your teeth and gums.  Regular physical activity.  Eating a healthy diet.  Avoiding tobacco and drug use.  Limiting alcohol use.  Practicing safe sex.  Taking low-dose aspirin every day starting at age 63. What happens during an annual well check? The services and screenings done by your health care provider during your annual well check  will depend on your age, overall health, lifestyle risk factors, and family history of disease. Counseling  Your health care provider may ask you questions about your:  Alcohol use.  Tobacco use.  Drug use.  Emotional well-being.  Home and relationship well-being.  Sexual activity.  Eating habits.  Work and work Statistician. Screening  You may have the following tests or measurements:  Height, weight, and BMI.  Blood pressure.  Lipid and cholesterol levels. These may be checked every 5 years, or more frequently if you are over 79 years old.  Skin check.  Lung cancer screening. You may have this screening every year starting at age 78 if you have a 30-pack-year history of smoking and currently smoke or have quit within the past 15 years.  Fecal occult blood test (FOBT) of the stool. You may have this test every year starting at age 7.  Flexible sigmoidoscopy or colonoscopy. You may have a sigmoidoscopy every 5 years or a colonoscopy every 10 years starting at age 55.  Prostate cancer screening. Recommendations will vary depending on your family history and other risks.  Hepatitis C blood test.  Hepatitis B blood test.  Sexually transmitted disease (STD) testing.  Diabetes screening. This is done by checking your blood sugar (glucose) after you have not eaten for a while (fasting). You may have this done every 1-3 years. Discuss your test results, treatment options, and if necessary, the need for more tests with your health care provider. Vaccines  Your health care provider may recommend certain vaccines, such as:  Influenza vaccine. This is recommended every year.  Tetanus, diphtheria, and acellular pertussis (Tdap, Td) vaccine. You may  need a Td booster every 10 years.  Zoster vaccine. You may need this after age 68.  Pneumococcal 13-valent conjugate (PCV13) vaccine. You may need this if you have certain conditions and have not been vaccinated.  Pneumococcal  polysaccharide (PPSV23) vaccine. You may need one or two doses if you smoke cigarettes or if you have certain conditions. Talk to your health care provider about which screenings and vaccines you need and how often you need them. This information is not intended to replace advice given to you by your health care provider. Make sure you discuss any questions you have with your health care provider. Document Released: 04/21/2015 Document Revised: 12/13/2015 Document Reviewed: 01/24/2015 Elsevier Interactive Patient Education  2017 [place] Prevention in the Home Falls can cause injuries. They can happen to people of all ages. There are many things you can do to make your home safe and to help prevent falls. What can I do on the outside of my home?  Regularly fix the edges of walkways and driveways and fix any cracks.  Remove anything that might make you trip as you walk through a door, such as a raised step or threshold.  Trim any bushes or trees on the path to your home.  Use bright outdoor lighting.  Clear any walking paths of anything that might make someone trip, such as rocks or tools.  Regularly check to see if handrails are loose or broken. Make sure that both sides of any steps have handrails.  Any raised decks and porches should have guardrails on the edges.  Have any leaves, snow, or ice cleared regularly.  Use sand or salt on walking paths during winter.  Clean up any spills in your garage right away. This includes oil or grease spills. What can I do in the bathroom?  Use night lights.  Install grab bars by the toilet and in the tub and shower. Do not use towel bars as grab bars.  Use non-skid mats or decals in the tub or shower.  If you need to sit down in the shower, use a plastic, non-slip stool.  Keep the floor dry. Clean up any water that spills on the floor as soon as it happens.  Remove soap buildup in the tub or shower regularly.  Attach bath  mats securely with double-sided non-slip rug tape.  Do not have throw rugs and other things on the floor that can make you trip. What can I do in the bedroom?  Use night lights.  Make sure that you have a light by your bed that is easy to reach.  Do not use any sheets or blankets that are too big for your bed. They should not hang down onto the floor.  Have a firm chair that has side arms. You can use this for support while you get dressed.  Do not have throw rugs and other things on the floor that can make you trip. What can I do in the kitchen?  Clean up any spills right away.  Avoid walking on wet floors.  Keep items that you use a lot in easy-to-reach places.  If you need to reach something above you, use a strong step stool that has a grab bar.  Keep electrical cords out of the way.  Do not use floor polish or wax that makes floors slippery. If you must use wax, use non-skid floor wax.  Do not have throw rugs and other things on the floor that can  make you trip. What can I do with my stairs?  Do not leave any items on the stairs.  Make sure that there are handrails on both sides of the stairs and use them. Fix handrails that are broken or loose. Make sure that handrails are as long as the stairways.  Check any carpeting to make sure that it is firmly attached to the stairs. Fix any carpet that is loose or worn.  Avoid having throw rugs at the top or bottom of the stairs. If you do have throw rugs, attach them to the floor with carpet tape.  Make sure that you have a light switch at the top of the stairs and the bottom of the stairs. If you do not have them, ask someone to add them for you. What else can I do to help prevent falls?  Wear shoes that:  Do not have high heels.  Have rubber bottoms.  Are comfortable and fit you well.  Are closed at the toe. Do not wear sandals.  If you use a stepladder:  Make sure that it is fully opened. Do not climb a closed  stepladder.  Make sure that both sides of the stepladder are locked into place.  Ask someone to hold it for you, if possible.  Clearly mark and make sure that you can see:  Any grab bars or handrails.  First and last steps.  Where the edge of each step is.  Use tools that help you move around (mobility aids) if they are needed. These include:  Canes.  Walkers.  Scooters.  Crutches.  Turn on the lights when you go into a dark area. Replace any light bulbs as soon as they burn out.  Set up your furniture so you have a clear path. Avoid moving your furniture around.  If any of your floors are uneven, fix them.  If there are any pets around you, be aware of where they are.  Review your medicines with your doctor. Some medicines can make you feel dizzy. This can increase your chance of falling. Ask your doctor what other things that you can do to help prevent falls. This information is not intended to replace advice given to you by your health care provider. Make sure you discuss any questions you have with your health care provider. Document Released: 01/19/2009 Document Revised: 08/31/2015 Document Reviewed: 04/29/2014 Elsevier Interactive Patient Education  2017 [employer].

## 2019-08-31 NOTE — Progress Notes (Signed)
Subjective:   Jorge Jordan is a 59 y.o. male who presents for an Initial Medicare Annual Wellness Visit.  Review of Systems   Cardiac Risk Factors include: advanced age (>49men, >73 women);dyslipidemia;hypertension;male gender    Objective:    Today's Vitals   08/31/19 1400  BP: 121/71  Pulse: (!) 120  Temp: (!) 97.5 F (36.4 C)  TempSrc: Temporal  Weight: 205 lb 9.6 oz (93.3 kg)  Height: 5\' 8"  (1.727 m)   Body mass index is 31.26 kg/m.  Advanced Directives 08/31/2019 10/12/2015  Does Patient Have a Medical Advance Directive? No No  Would patient like information on creating a medical advance directive? - Yes - Educational materials given    Current Medications (verified) Outpatient Encounter Medications as of 08/31/2019  Medication Sig  . amLODipine (NORVASC) 5 MG tablet Take 1 tablet (5 mg total) by mouth daily.  Marland Kitchen aspirin EC 81 MG tablet Take 81 mg by mouth daily.  . busPIRone (BUSPAR) 10 MG tablet Take 1 tablet (10 mg total) by mouth 3 (three) times daily.  Marland Kitchen lisinopril-hydrochlorothiazide (ZESTORETIC) 20-25 MG tablet Take 1 tablet by mouth daily.  . pravastatin (PRAVACHOL) 40 MG tablet TAKE ONE TABLET BY MOUTH DAILY  . sildenafil (REVATIO) 20 MG tablet Take 3-5 tablets as needed 30 prior to sex.  . traZODone (DESYREL) 100 MG tablet Take 1 tablet (100 mg total) by mouth at bedtime.  . vortioxetine HBr (TRINTELLIX) 10 MG TABS tablet Take 1 tablet (10 mg total) by mouth daily.  Marland Kitchen acetaminophen (TYLENOL) 500 MG tablet Take 1,000 mg by mouth every 6 (six) hours as needed.  . [DISCONTINUED] augmented betamethasone dipropionate (DIPROLENE-AF) 0.05 % cream   . [DISCONTINUED] meloxicam (MOBIC) 15 MG tablet Take 1 tablet (15 mg total) by mouth daily. For up to 1-2 weeks as needed (Patient not taking: Reported on 08/31/2019)   No facility-administered encounter medications on file as of 08/31/2019.    Allergies (verified) Patient has no known allergies.   History: Past  Medical History:  Diagnosis Date  . Allergy   . Anxiety   . Arthritis   . Foot deformity    foot arthrodesis L foot  . Hyperlipidemia   . Osteoporosis   . Shoulder bursitis    both   . Tendinitis    elbow   Past Surgical History:  Procedure Laterality Date  . FOOT SURGERY Left 2001   Triple arthrodesis  . ROTATOR CUFF REPAIR Left   . SHOULDER FUSION SURGERY    . WRIST SURGERY     ganglion cyst   Family History  Problem Relation Age of Onset  . Hyperlipidemia Mother   . Hypertension Mother   . Diabetes Mother   . Depression Mother   . Hypertension Father   . Hyperlipidemia Father   . Cancer Father        skin cancer  . Hypertension Brother   . Diabetes Maternal Grandmother   . Heart attack Maternal Grandfather    Social History   Socioeconomic History  . Marital status: Married    Spouse name: Not on file  . Number of children: Not on file  . Years of education: Not on file  . Highest education level: Not on file  Occupational History  . Occupation: disability   Tobacco Use  . Smoking status: Never Smoker  . Smokeless tobacco: Never Used  Substance and Sexual Activity  . Alcohol use: Yes    Alcohol/week: 3.0 standard drinks  Types: 3 Shots of liquor per week    Comment: 3-4 nights a week   . Drug use: No  . Sexual activity: Not on file  Other Topics Concern  . Not on file  Social History Narrative  . Not on file   Social Determinants of Health   Financial Resource Strain: Low Risk   . Difficulty of Paying Living Expenses: Not hard at all  Food Insecurity: No Food Insecurity  . Worried About Charity fundraiser in the Last Year: Never true  . Ran Out of Food in the Last Year: Never true  Transportation Needs: No Transportation Needs  . Lack of Transportation (Medical): No  . Lack of Transportation (Non-Medical): No  Physical Activity:   . Days of Exercise per Week:   . Minutes of Exercise per Session:   Stress:   . Feeling of Stress :     Social Connections: Moderately Isolated  . Frequency of Communication with Friends and Family: Once a week  . Frequency of Social Gatherings with Friends and Family: Once a week  . Attends Religious Services: Never  . Active Member of Clubs or Organizations: No  . Attends Archivist Meetings: Never  . Marital Status: Married   Tobacco Counseling Counseling given: Not Answered   Clinical Intake:  Pre-visit preparation completed: Yes  Pain : No/denies pain     Nutritional Status: BMI > 30  Obese Nutritional Risks: None Diabetes: No  How often do you need to have someone help you when you read instructions, pamphlets, or other written materials from your doctor or pharmacy?: 1 - Never  Interpreter Needed?: No  Information entered by :: Dino Borntreger,LPN  Activities of Daily Living In your present state of health, do you have any difficulty performing the following activities: 08/31/2019 10/22/2018  Hearing? Y N  Comment no hearing aids. -  Vision? N Y  Comment eyeglasses, no eye dr glasses  Difficulty concentrating or making decisions? N N  Walking or climbing stairs? N N  Dressing or bathing? N N  Doing errands, shopping? N N  Preparing Food and eating ? N -  Using the Toilet? N -  In the past six months, have you accidently leaked urine? N -  Do you have problems with loss of bowel control? N -  Managing your Medications? N -  Managing your Finances? N -  Housekeeping or managing your Housekeeping? N -  Some recent data might be hidden     Immunizations and Health Maintenance Immunization History  Administered Date(s) Administered  . Influenza,inj,Quad PF,6+ Mos 02/05/2017, 01/26/2018  . Influenza-Unspecified 02/05/2017, 02/08/2019  . PFIZER SARS-COV-2 Vaccination 07/01/2019, 07/22/2019  . Tdap 10/19/2012   There are no preventive care reminders to display for this patient.  Patient Care Team: Olin Hauser, DO as PCP - General (Family  Medicine) Graylin Shiver, [place] (Rheumatology)  Indicate any recent Medical Services you may have received from other than [facility] providers in the past year (date may be approximate).    Assessment:   This is a routine wellness examination for Jorge Jordan.  Hearing/Vision screen  Hearing Screening   125Hz  250Hz  500Hz  1000Hz  2000Hz  3000Hz  4000Hz  6000Hz  8000Hz   Right ear:           Left ear:           Vision Screening Comments: Previously went to Dr.Bell   Dietary issues and exercise activities discussed: Current Exercise Habits: The patient does not participate in regular exercise  at present, Exercise limited by: None identified  Goals Addressed   None    Depression Screen PHQ 2/9 Scores 08/31/2019 04/26/2019 10/22/2018 01/26/2018  PHQ - 2 Score 4 0 0 0  PHQ- 9 Score 9 1 0 0    Fall Risk Fall Risk  08/31/2019 04/26/2019 10/22/2018 01/26/2018  Falls in the past year? 0 0 0 No  Number falls in past yr: 0 0 0 -  Injury with Fall? 0 0 - -  Follow up - Falls evaluation completed - -    FALL RISK PREVENTION PERTAINING TO THE HOME:  Any stairs in or around the home? Yes  If so, are there any without handrails? No   Home free of loose throw rugs in walkways, pet beds, electrical cords, etc? Yes  Adequate lighting in your home to reduce risk of falls? Yes   ASSISTIVE DEVICES UTILIZED TO PREVENT FALLS:  Life alert? No  Use of a cane, walker or w/c? No  Grab bars in the bathroom? No  Shower chair or bench in shower? No  Elevated toilet seat or a handicapped toilet? No    TIMED UP AND GO:  Was the test performed? Yes .  Length of time to ambulate 10 feet: 8 sec.   GAIT:  Appearance of gait: Gait steady and fastwithout the use of an assistive device. Education: Fall risk prevention has been discussed.  Intervention(s) required? No   DME/home health order needed?  No    Cognitive Function:        Screening Tests Health Maintenance  Topic Date Due  . HIV Screening   01/07/2020 (Originally 02/08/1976)  . INFLUENZA VACCINE  11/07/2019  . Fecal DNA (Cologuard)  02/17/2021  . TETANUS/TDAP  10/20/2022  . COVID-19 Vaccine  Completed  . Hepatitis C Screening  Completed    Qualifies for Shingles Vaccine? Yes  Zostavax completed n/a. Due for Shingrix. Education has been provided regarding the importance of this vaccine. Pt has been advised to call insurance company to determine out of pocket expense. Advised may also receive vaccine at local pharmacy or Health Dept. Verbalized acceptance and understanding.  Tdap: up to date   Flu Vaccine: due 12/2019  Pneumococcal Vaccine: not indicated   Covid-19 Vaccine: Completed vaccines  Cancer Screenings:  Colorectal Screening: Completed 2019, due 02/2021  Lung Cancer Screening: (Low Dose CT Chest recommended if Age 1-80 years, 30 pack-year currently smoking OR have quit w/in 15years.) does not qualify.    Additional Screening:  Hepatitis C Screening: does qualify; Completed 2017  Vision Screening: Recommended annual ophthalmology exams for early detection of glaucoma and other disorders of the eye. Is the patient up to date with their annual eye exam?  Yes    Dental Screening: Recommended annual dental exams for proper oral hygiene  Community Resource Referral:  CRR required this visit?  No        Plan:  I have personally reviewed and addressed the Medicare Annual Wellness questionnaire and have noted the following in the patient's chart:  A. Medical and social history B. Use of alcohol, tobacco or illicit drugs  C. Current medications and supplements D. Functional ability and status E.  Nutritional status F.  Physical activity G. Advance directives H. List of other physicians I.  Hospitalizations, surgeries, and ER visits in previous 12 months J.  [place] such as hearing and vision if needed, cognitive and depression L. Referrals and appointments   In addition, I have reviewed  and  discussed with patient certain preventive protocols, quality metrics, and best practice recommendations. A written personalized care plan for preventive services as well as general preventive health recommendations were provided to patient.   Signed,    Bevelyn Ngo, LPN   [id number]  Nurse Health Advisor   Nurse Notes:

## 2019-10-19 ENCOUNTER — Other Ambulatory Visit: Payer: Self-pay

## 2019-10-19 DIAGNOSIS — I10 Essential (primary) hypertension: Secondary | ICD-10-CM

## 2019-10-19 DIAGNOSIS — Z125 Encounter for screening for malignant neoplasm of prostate: Secondary | ICD-10-CM

## 2019-10-19 DIAGNOSIS — E782 Mixed hyperlipidemia: Secondary | ICD-10-CM

## 2019-10-19 DIAGNOSIS — Z0000 Encounter for general adult medical examination without abnormal findings: Secondary | ICD-10-CM

## 2019-10-19 DIAGNOSIS — R7303 Prediabetes: Secondary | ICD-10-CM

## 2019-10-21 DIAGNOSIS — R7303 Prediabetes: Secondary | ICD-10-CM | POA: Diagnosis not present

## 2019-10-21 DIAGNOSIS — E782 Mixed hyperlipidemia: Secondary | ICD-10-CM | POA: Diagnosis not present

## 2019-10-21 DIAGNOSIS — Z0000 Encounter for general adult medical examination without abnormal findings: Secondary | ICD-10-CM | POA: Diagnosis not present

## 2019-10-21 DIAGNOSIS — I10 Essential (primary) hypertension: Secondary | ICD-10-CM | POA: Diagnosis not present

## 2019-10-22 LAB — LIPID PANEL
Chol/HDL Ratio: 5.4 ratio — ABNORMAL HIGH (ref 0.0–5.0)
Cholesterol, Total: 198 mg/dL (ref 100–199)
HDL: 37 mg/dL — ABNORMAL LOW (ref >39)
LDL Chol Calc (NIH): 135 mg/dL — ABNORMAL HIGH (ref 0–99)
Triglycerides: 143 mg/dL (ref 0–149)
VLDL Cholesterol Cal: 26 mg/dL (ref 5–40)

## 2019-10-22 LAB — CBC WITH DIFFERENTIAL/PLATELET
Basophils Absolute: 0.1 10*3/uL (ref 0.0–0.2)
Basos: 1 %
EOS (ABSOLUTE): 0.1 10*3/uL (ref 0.0–0.4)
Eos: 3 %
Hematocrit: 46 % (ref 37.5–51.0)
Hemoglobin: 16.7 g/dL (ref 13.0–17.7)
Immature Grans (Abs): 0 10*3/uL (ref 0.0–0.1)
Immature Granulocytes: 0 %
Lymphocytes Absolute: 1.3 10*3/uL (ref 0.7–3.1)
Lymphs: 29 %
MCH: 34.6 pg — ABNORMAL HIGH (ref 26.6–33.0)
MCHC: 36.3 g/dL — ABNORMAL HIGH (ref 31.5–35.7)
MCV: 95 fL (ref 79–97)
Monocytes Absolute: 0.6 10*3/uL (ref 0.1–0.9)
Monocytes: 14 %
Neutrophils Absolute: 2.4 10*3/uL (ref 1.4–7.0)
Neutrophils: 53 %
Platelets: 260 10*3/uL (ref 150–450)
RBC: 4.83 x10E6/uL (ref 4.14–5.80)
RDW: 11.7 % (ref 11.6–15.4)
WBC: 4.6 10*3/uL (ref 3.4–10.8)

## 2019-10-22 LAB — COMPREHENSIVE METABOLIC PANEL
ALT: 33 IU/L (ref 0–44)
AST: 24 IU/L (ref 0–40)
Albumin/Globulin Ratio: 1.7 (ref 1.2–2.2)
Albumin: 4.6 g/dL (ref 3.8–4.9)
Alkaline Phosphatase: 80 IU/L (ref 48–121)
BUN/Creatinine Ratio: 11 (ref 9–20)
BUN: 14 mg/dL (ref 6–24)
Bilirubin Total: 0.7 mg/dL (ref 0.0–1.2)
CO2: 23 mmol/L (ref 20–29)
Calcium: 9.9 mg/dL (ref 8.7–10.2)
Chloride: 99 mmol/L (ref 96–106)
Creatinine, Ser: 1.25 mg/dL (ref 0.76–1.27)
GFR calc Af Amer: 73 mL/min/{1.73_m2} (ref >59)
GFR calc non Af Amer: 63 mL/min/{1.73_m2} (ref >59)
Globulin, Total: 2.7 g/dL (ref 1.5–4.5)
Glucose: 107 mg/dL — ABNORMAL HIGH (ref 65–99)
Potassium: 4.1 mmol/L (ref 3.5–5.2)
Sodium: 137 mmol/L (ref 134–144)
Total Protein: 7.3 g/dL (ref 6.0–8.5)

## 2019-10-22 LAB — HEMOGLOBIN A1C
Est. average glucose Bld gHb Est-mCnc: 126 mg/dL
Hgb A1c MFr Bld: 6 % — ABNORMAL HIGH (ref 4.8–5.6)

## 2019-10-22 LAB — PSA: Prostate Specific Ag, Serum: 0.9 ng/mL (ref 0.0–4.0)

## 2019-10-25 ENCOUNTER — Encounter: Payer: Self-pay | Admitting: Family Medicine

## 2019-10-25 ENCOUNTER — Other Ambulatory Visit: Payer: Self-pay

## 2019-10-25 ENCOUNTER — Ambulatory Visit (INDEPENDENT_AMBULATORY_CARE_PROVIDER_SITE_OTHER): Payer: Medicare Other | Admitting: Family Medicine

## 2019-10-25 VITALS — BP 103/74 | HR 86 | Temp 97.3°F | Resp 16 | Ht 68.0 in | Wt 196.6 lb

## 2019-10-25 DIAGNOSIS — F339 Major depressive disorder, recurrent, unspecified: Secondary | ICD-10-CM | POA: Diagnosis not present

## 2019-10-25 DIAGNOSIS — R7303 Prediabetes: Secondary | ICD-10-CM

## 2019-10-25 DIAGNOSIS — N522 Drug-induced erectile dysfunction: Secondary | ICD-10-CM | POA: Diagnosis not present

## 2019-10-25 DIAGNOSIS — Z0000 Encounter for general adult medical examination without abnormal findings: Secondary | ICD-10-CM

## 2019-10-25 DIAGNOSIS — F4310 Post-traumatic stress disorder, unspecified: Secondary | ICD-10-CM | POA: Diagnosis not present

## 2019-10-25 DIAGNOSIS — E7800 Pure hypercholesterolemia, unspecified: Secondary | ICD-10-CM

## 2019-10-25 DIAGNOSIS — E663 Overweight: Secondary | ICD-10-CM

## 2019-10-25 DIAGNOSIS — I10 Essential (primary) hypertension: Secondary | ICD-10-CM

## 2019-10-25 DIAGNOSIS — K760 Fatty (change of) liver, not elsewhere classified: Secondary | ICD-10-CM

## 2019-10-25 MED ORDER — PRAVASTATIN SODIUM 40 MG PO TABS: 40.0000 mg | ORAL_TABLET | Freq: Every day | ORAL | 3 refills | Status: DC

## 2019-10-25 MED ORDER — BUSPIRONE HCL 10 MG PO TABS: 10.0000 mg | ORAL_TABLET | Freq: Three times a day (TID) | ORAL | 3 refills | Status: DC

## 2019-10-25 MED ORDER — TRAZODONE HCL 100 MG PO TABS: 100.0000 mg | ORAL_TABLET | Freq: Every day | ORAL | 3 refills | Status: DC

## 2019-10-25 MED ORDER — AMLODIPINE BESYLATE 5 MG PO TABS: 5.0000 mg | ORAL_TABLET | Freq: Every day | ORAL | 3 refills | Status: DC

## 2019-10-25 MED ORDER — SILDENAFIL CITRATE 20 MG PO TABS: ORAL_TABLET | ORAL | 3 refills | Status: DC

## 2019-10-25 MED ORDER — LISINOPRIL-HYDROCHLOROTHIAZIDE 20-25 MG PO TABS: 1.0000 | ORAL_TABLET | Freq: Every day | ORAL | 3 refills | Status: DC

## 2019-10-25 NOTE — Progress Notes (Signed)
Subjective:    Patient ID: Jorge Jordan, male    DOB: [birth year], 59 y.o.   MRN: [id number]  Jorge Jordan is a 59 y.o. male presenting on 10/25/2019 for Annual Exam   HPI   Here for Annual Physical and Lab Review.  Elevated A1c -Pre-Diabetes / Overweight BMI >29 Interval update, his A1c has remained steady around A1c 6.0 now for past 2 readings. Meds:None (never on meds) Currently on ACEi Lifestyle: Weight up from previous but stable from >1 year ago Diet - no significant changeshas tried to improve diet, but not always Now abstain from alcohol - Exercise (improving) - Family history of DM Denies hypoglycemia  CHRONIC HTN: Reports checks BP at homewith normal BP readings on avgstill. 100-120 SBP avg He has lost some weight significantly. Has not changed meds however Current Meds - Lisinopril-HCTZ 20-25mg  daily, Amlodipine 5mg  daily Reports good compliance, took meds today. Tolerating well, w/o complaints. Denies CP, dyspnea, HA, edema, dizziness / lightheadedness  HYPERLIPIDEMIA: - Reports concerns. Last lipid 10/2019 some increase from 110 to 135, improved TG. Lower HDL - Currently takingPravastatin 40mg , tolerating well without side effects or myalgias  Hepatic Steatosis Elevated LFTs - resolved, normalized 7.2021 No new symptoms or concerns. Now abstaining from alcohol Prior US 04/2019 showed diffuse changes, see result.  Major Depression, chronic recurrent / Anxiety + PTSD significant improvement Continues to do therapy/counseling every other month virtually now on computer with benefits No longer followed by Psychiatry. - He is tolerating meds well, without concern, see PHQ GAD score below - Continues on Trintellix 10mg  daily, Trazodone 100mg  nightly, Buspirone 10mg  TID Last Trintellix rx authorized on his manufacturer discount was 38/25/0539 - completed application, he has still be receiving med now in 2021, no new concerns.  Health  Maintenance:  Prostate CA Screening: Prior PSA / DRE reported normal. Last PSA 0.9 (10/2019). Currently asymptomatic without any BPH LUTS. No known family history of prostate CA.   Colon CA Screening: Never had colonoscopy. Screening in the past with [facility] FIT test prior to 2019. Also has had Cologuard 02/17/18 (negative). Currently asymptomatic. No known family history of colon CA. Not due yet. Next due in 02/2021 - he is interested in Colonoscopy (initial colonoscopy, had not had before)   Depression screen [facility] 2/9 10/25/2019 08/31/2019 04/26/2019  Decreased Interest 1 3 0  Down, Depressed, Hopeless 1 1 0  PHQ - 2 Score 2 4 0  Altered sleeping 0 0 0  Tired, decreased energy 0 3 1  Change in appetite 1 1 0  Feeling bad or failure about yourself  1 1 0  Trouble concentrating 0 0 0  Moving slowly or fidgety/restless 0 0 0  Suicidal thoughts 0 0 0  PHQ-9 Score 4 9 1   Difficult doing work/chores Somewhat difficult Very difficult Not difficult at all   GAD 7 : Generalized Anxiety Score 10/25/2019 01/26/2018 07/21/2017 05/30/2015  Nervous, Anxious, on Edge 1 0 2 3  Control/stop worrying 0 0 2 3  Worry too much - different things 1 0 1 3  Trouble relaxing 1 0 1 3  Restless 0 0 1 3  Easily annoyed or irritable 1 0 1 3  Afraid - awful might happen 0 0 1 3  Total GAD 7 Score 4 0 9 21  Anxiety Difficulty Somewhat difficult Not difficult at all Very difficult Somewhat difficult     Past Medical History:  Diagnosis Date  . Allergy   . Anxiety   .  Arthritis   . Foot deformity    foot arthrodesis L foot  . Hyperlipidemia   . Osteoporosis   . Shoulder bursitis    both   . Tendinitis    elbow   Past Surgical History:  Procedure Laterality Date  . FOOT SURGERY Left 2001   Triple arthrodesis  . ROTATOR CUFF REPAIR Left   . SHOULDER FUSION SURGERY    . WRIST SURGERY     ganglion cyst   Social History   Socioeconomic History  . Marital status: Married    Spouse name: Not on file   . Number of children: Not on file  . Years of education: Not on file  . Highest education level: Not on file  Occupational History  . Occupation: disability   Tobacco Use  . Smoking status: Never Smoker  . Smokeless tobacco: Never Used  Substance and Sexual Activity  . Alcohol use: Not Currently    Alcohol/week: 0.0 standard drinks  . Drug use: No  . Sexual activity: Not on file  Other Topics Concern  . Not on file  Social History Narrative  . Not on file   Social Determinants of Health   Financial Resource Strain: Low Risk   . Difficulty of Paying Living Expenses: Not hard at all  Food Insecurity: No Food Insecurity  . Worried About Charity fundraiser in the Last Year: Never true  . Ran Out of Food in the Last Year: Never true  Transportation Needs: No Transportation Needs  . Lack of Transportation (Medical): No  . Lack of Transportation (Non-Medical): No  Physical Activity:   . Days of Exercise per Week:   . Minutes of Exercise per Session:   Stress:   . Feeling of Stress :   Social Connections: Socially Isolated  . Frequency of Communication with Friends and Family: Once a week  . Frequency of Social Gatherings with Friends and Family: Once a week  . Attends Religious Services: Never  . Active Member of Clubs or Organizations: No  . Attends Archivist Meetings: Never  . Marital Status: Married  Human resources officer Violence:   . Fear of Current or Ex-Partner:   . Emotionally Abused:   Marland Kitchen Physically Abused:   . Sexually Abused:    Family History  Problem Relation Age of Onset  . Hyperlipidemia Mother   . Hypertension Mother   . Diabetes Mother   . Depression Mother   . Hypertension Father   . Hyperlipidemia Father   . Cancer Father        skin cancer  . Hypertension Brother   . Diabetes Maternal Grandmother   . Heart attack Maternal Grandfather    Current Outpatient Medications on File Prior to Visit  Medication Sig  . acetaminophen (TYLENOL)  500 MG tablet Take 1,000 mg by mouth every 6 (six) hours as needed.  Marland Kitchen aspirin EC 81 MG tablet Take 81 mg by mouth daily.  Marland Kitchen vortioxetine HBr (TRINTELLIX) 10 MG TABS tablet Take 1 tablet (10 mg total) by mouth daily.   No current facility-administered medications on file prior to visit.    Review of Systems  Constitutional: Negative for activity change, appetite change, chills, diaphoresis, fatigue and fever.  HENT: Negative for congestion and hearing loss.   Eyes: Negative for visual disturbance.  Respiratory: Negative for apnea, cough, chest tightness, shortness of breath and wheezing.   Cardiovascular: Negative for chest pain, palpitations and leg swelling.  Gastrointestinal: Negative for abdominal pain, constipation,  diarrhea, nausea and vomiting.  Endocrine: Negative for cold intolerance.  Genitourinary: Negative for difficulty urinating, dysuria, frequency and hematuria.  Musculoskeletal: Negative for arthralgias and neck pain.  Skin: Negative for rash.  Allergic/Immunologic: Negative for environmental allergies.  Neurological: Negative for dizziness, weakness, light-headedness, numbness and headaches.  Hematological: Negative for adenopathy.  Psychiatric/Behavioral: Negative for behavioral problems, dysphoric mood and sleep disturbance.   Per HPI unless specifically indicated above     Objective:    BP 103/74   Pulse 86   Temp (!) 97.3 F (36.3 C) (Temporal)   Resp 16   Ht 5\' 8"  (1.727 m)   Wt 196 lb 9.6 oz (89.2 kg)   SpO2 99%   BMI 29.89 kg/m   Wt Readings from Last 3 Encounters:  10/25/19 196 lb 9.6 oz (89.2 kg)  08/31/19 205 lb 9.6 oz (93.3 kg)  04/26/19 214 lb (97.1 kg)    Physical Exam Vitals and nursing note reviewed.  Constitutional:      General: He is not in acute distress.    Appearance: He is well-developed. He is not diaphoretic.     Comments: Well-appearing, comfortable, cooperative  HENT:     Head: Normocephalic and atraumatic.  Eyes:      General:        Right eye: No discharge.        Left eye: No discharge.     Conjunctiva/sclera: Conjunctivae normal.     Pupils: Pupils are equal, round, and reactive to light.  Neck:     Thyroid: No thyromegaly.     Vascular: No carotid bruit.  Cardiovascular:     Rate and Rhythm: Normal rate and regular rhythm.     Heart sounds: Normal heart sounds. No murmur heard.   Pulmonary:     Effort: Pulmonary effort is normal. No respiratory distress.     Breath sounds: Normal breath sounds. No wheezing or rales.  Abdominal:     General: Bowel sounds are normal. There is no distension.     Palpations: Abdomen is soft. There is no mass.     Tenderness: There is no abdominal tenderness.  Musculoskeletal:        General: No tenderness. Normal range of motion.     Cervical back: Normal range of motion and neck supple.     Right lower leg: No edema.     Left lower leg: No edema.     Comments: Upper / Lower Extremities: - Normal muscle tone, strength bilateral upper extremities 5/5, lower extremities 5/5  Lymphadenopathy:     Cervical: No cervical adenopathy.  Skin:    General: Skin is warm and dry.     Findings: No erythema or rash.     Comments: Scattered AKs on scalp  Neurological:     Mental Status: He is alert and oriented to person, place, and time.     Comments: Distal sensation intact to light touch all extremities  Psychiatric:        Behavior: Behavior normal.     Comments: Well groomed, good eye contact, normal speech and thoughts      CLINICAL DATA:  Elevated liver enzymes  EXAM: ULTRASOUND ABDOMEN LIMITED RIGHT UPPER QUADRANT  COMPARISON:  None.  FINDINGS: Gallbladder:  No gallstones or wall thickening visualized. There is no pericholecystic fluid. No sonographic Murphy sign noted by sonographer.  Common bile duct:  Diameter: 3 mm. No intrahepatic or extrahepatic biliary duct dilatation.  Liver:  No focal lesion identified. Liver echogenicity is  increased diffusely. Portal vein is patent on color Doppler imaging with normal direction of blood flow towards the liver.  Other: None.  IMPRESSION: Diffuse increase in liver echogenicity, a finding indicative of hepatic steatosis, potentially with underlying parenchymal liver disease. While no focal liver lesions are evident on this study, it must be cautioned that the sensitivity of ultrasound for detection of focal liver lesions is diminished significantly in this circumstance.  Study otherwise unremarkable.   Electronically Signed   By: Lowella Grip III M.D.   On: 04/29/2019 11:23  Results for orders placed or performed in visit on 10/19/19  Comprehensive metabolic panel  Result Value Ref Range   Glucose 107 (H) 65 - 99 mg/dL   BUN 14 6 - 24 mg/dL   Creatinine, Ser 1.25 0.76 - 1.27 mg/dL   GFR calc non Af Amer 63 >59 mL/min/1.73   GFR calc Af Amer 73 >59 mL/min/1.73   BUN/Creatinine Ratio 11 9 - 20   Sodium 137 134 - 144 mmol/L   Potassium 4.1 3.5 - 5.2 mmol/L   Chloride 99 96 - 106 mmol/L   CO2 23 20 - 29 mmol/L   Calcium 9.9 8.7 - 10.2 mg/dL   Total Protein 7.3 6.0 - 8.5 g/dL   Albumin 4.6 3.8 - 4.9 g/dL   Globulin, Total 2.7 1.5 - 4.5 g/dL   Albumin/Globulin Ratio 1.7 1.2 - 2.2   Bilirubin Total 0.7 0.0 - 1.2 mg/dL   Alkaline Phosphatase 80 48 - 121 IU/L   AST 24 0 - 40 IU/L   ALT 33 0 - 44 IU/L  PSA  Result Value Ref Range   Prostate Specific Ag, Serum 0.9 0.0 - 4.0 ng/mL  Lipid panel  Result Value Ref Range   Cholesterol, Total 198 100 - 199 mg/dL   Triglycerides 143 0 - 149 mg/dL   HDL 37 (L) >39 mg/dL   VLDL Cholesterol Cal 26 5 - 40 mg/dL   LDL Chol Calc (NIH) 135 (H) 0 - 99 mg/dL   Chol/HDL Ratio 5.4 (H) 0.0 - 5.0 ratio  CBC with Differential/Platelet  Result Value Ref Range   WBC 4.6 3.4 - 10.8 x10E3/uL   RBC 4.83 4.14 - 5.80 x10E6/uL   Hemoglobin 16.7 13.0 - 17.7 g/dL   Hematocrit 46.0 37.5 - 51.0 %   MCV 95 79 - 97 fL   MCH 34.6  (H) 26.6 - 33.0 pg   MCHC 36.3 (H) 31 - 35 g/dL   RDW 11.7 11.6 - 15.4 %   Platelets 260 150 - 450 x10E3/uL   Neutrophils 53 Not Estab. %   Lymphs 29 Not Estab. %   Monocytes 14 Not Estab. %   Eos 3 Not Estab. %   Basos 1 Not Estab. %   Neutrophils Absolute 2.4 1 - 7 x10E3/uL   Lymphocytes Absolute 1.3 0 - 3 x10E3/uL   Monocytes Absolute 0.6 0 - 0 x10E3/uL   EOS (ABSOLUTE) 0.1 0.0 - 0.4 x10E3/uL   Basophils Absolute 0.1 0 - 0 x10E3/uL   Immature Granulocytes 0 Not Estab. %   Immature Grans (Abs) 0.0 0.0 - 0.1 x10E3/uL  Hemoglobin A1c  Result Value Ref Range   Hgb A1c MFr Bld 6.0 (H) 4.8 - 5.6 %   Est. average glucose Bld gHb Est-mCnc 126 mg/dL      Assessment & Plan:   Problem List Items Addressed This Visit    PTSD (post-traumatic stress disorder)    See A&P Depression  Relevant Medications   traZODone (DESYREL) 100 MG tablet   busPIRone (BUSPAR) 10 MG tablet   Pre-diabetes    Improved A1c to 6.0 Concern with obesity, HTN, HLD  Plan:  1. Not on any therapy currently  - discussed possible metformin, agree to defer and start aggressive lifestyle intervention - since he is already improving. 2. Encourage improved lifestyle - low carb, low sugar diet, reduce portion size, continue improving regular exercise 3. Follow-up 6 months w/ repeat lab      Overweight (BMI 25.0-29.9)    Encourage lifestyle diet exercise wt loss      Major depressive disorder, recurrent episode with anxious distress ([place])    Controlled depression anxiety PTSD No longer followed by Psych Continues on Trintellix (financial asst from manufacturer, Buspirone, Trazodone  Next financial assistance app order of Trintellix 01/2020      Relevant Medications   traZODone (DESYREL) 100 MG tablet   busPIRone (BUSPAR) 10 MG tablet   Hepatic steatosis    Identified on RUQ Abd [place] 04/2019 with elevated LFT, overweight, HLD  Now improving HLD. Weight down. Stopped Alcohol again. LFT normalized  Will  discuss in 6 months, repeat labs lipid cmet, consider repeat US vs GI in future      Essential hypertension    Controlled HTN - Home BP readings normal  Complication with CKD-II    Plan:  1. Continue current BP regimen - Amlodipine 5mg  daily, Lisinopril-HCTZ - 20-25mg  daily 2. Encourage improved lifestyle - low sodium diet, regular exercise 3. Continue monitor BP outside office, bring readings to next visit, if persistently >140/90 or new symptoms notify office sooner  Future consider taper off Amlodipine vs lower dose Lisinopril-HCTZ if well controlled now w weight loss      Relevant Medications   sildenafil (REVATIO) 20 MG tablet   pravastatin (PRAVACHOL) 40 MG tablet   lisinopril-hydrochlorothiazide (ZESTORETIC) 20-25 MG tablet   amLODipine (NORVASC) 5 MG tablet    Other Visit Diagnoses    Annual physical exam    -  Primary   Drug-induced erectile dysfunction       Relevant Medications   sildenafil (REVATIO) 20 MG tablet   Hypercholesterolemia       Relevant Medications   sildenafil (REVATIO) 20 MG tablet   pravastatin (PRAVACHOL) 40 MG tablet   lisinopril-hydrochlorothiazide (ZESTORETIC) 20-25 MG tablet   amLODipine (NORVASC) 5 MG tablet      Updated Health Maintenance information Reviewed recent lab results with patient Encouraged improvement to lifestyle with diet and exercise - Goal of weight loss    Meds ordered this encounter  Medications  . traZODone (DESYREL) 100 MG tablet    Sig: Take 1 tablet (100 mg total) by mouth at bedtime.    Dispense:  90 tablet    Refill:  3  . sildenafil (REVATIO) 20 MG tablet    Sig: Take 3-5 tablets as needed 30 prior to sex.    Dispense:  30 tablet    Refill:  3  . pravastatin (PRAVACHOL) 40 MG tablet    Sig: Take 1 tablet (40 mg total) by mouth daily.    Dispense:  90 tablet    Refill:  3  . lisinopril-hydrochlorothiazide (ZESTORETIC) 20-25 MG tablet    Sig: Take 1 tablet by mouth daily.    Dispense:  90 tablet     Refill:  3  . busPIRone (BUSPAR) 10 MG tablet    Sig: Take 1 tablet (10 mg total) by mouth 3 (three) times  daily.    Dispense:  270 tablet    Refill:  3  . amLODipine (NORVASC) 5 MG tablet    Sig: Take 1 tablet (5 mg total) by mouth daily.    Dispense:  90 tablet    Refill:  3      Follow up plan: Return in about 6 months (around 04/26/2020) for 6 month follow-up Cholesterol, lab results, Liver, Sugar (?future liver ultrasound?).   6 month labs [facility] - A1c, CMET, Lipid  Future consider order Abdominal US likely discuss at next visit may order at that time.  Nobie Putnam, DO [facility] 10/25/2019, 2:02 PM

## 2019-10-25 NOTE — Patient Instructions (Addendum)
Thank you for coming to the office today.  February 03 2019 was last renewal from the company for Trintellix - let them know for renewal and have them send [place] paperwork.  Refilled all other meds for 1 year.  We can decide on Liver ultrasound repeat after we discuss in office.  Reminder - Colonoscopy - we can discuss ordering this   DUE for FASTING BLOOD WORK (no food or drink after midnight before the lab appointment, only water or coffee without cream/sugar on the morning of)  [facility] - for A1c, CMET, Lipid 1 week before you come in.  For Lab Results, once available within 2-3 days of blood draw, you can can log in to MyChart online to view your results and a brief explanation. Also, we can discuss results at next follow-up visit.   Please schedule a Follow-up Appointment to: Return in about 6 months (around 04/26/2020) for 6 month follow-up Cholesterol, lab results, Liver, Sugar (?future liver ultrasound?).  If you have any other questions or concerns, please feel free to call the office or send a message through [place]. You may also schedule an earlier appointment if necessary.  Additionally, you may be receiving a survey about your experience at our office within a few days to 1 week by e-mail or mail. We value your feedback.  Nobie Putnam, DO [place]

## 2019-10-26 ENCOUNTER — Other Ambulatory Visit: Payer: Self-pay | Admitting: Family Medicine

## 2019-10-26 ENCOUNTER — Encounter: Payer: Self-pay | Admitting: Family Medicine

## 2019-10-26 DIAGNOSIS — K760 Fatty (change of) liver, not elsewhere classified: Secondary | ICD-10-CM

## 2019-10-26 DIAGNOSIS — I10 Essential (primary) hypertension: Secondary | ICD-10-CM

## 2019-10-26 DIAGNOSIS — E782 Mixed hyperlipidemia: Secondary | ICD-10-CM

## 2019-10-26 DIAGNOSIS — R7303 Prediabetes: Secondary | ICD-10-CM

## 2019-10-26 NOTE — Assessment & Plan Note (Signed)
Improved A1c to 6.0 Concern with obesity, HTN, HLD  Plan:  1. Not on any therapy currently  - discussed possible metformin, agree to defer and start aggressive lifestyle intervention - since he is already improving. 2. Encourage improved lifestyle - low carb, low sugar diet, reduce portion size, continue improving regular exercise 3. Follow-up 6 months w/ repeat lab

## 2019-10-26 NOTE — Assessment & Plan Note (Signed)
See A&P Depression

## 2019-10-26 NOTE — Assessment & Plan Note (Signed)
Controlled HTN - Home BP readings normal  Complication with CKD-II    Plan:  1. Continue current BP regimen - Amlodipine 5mg daily, Lisinopril-HCTZ - 20-25mg daily 2. Encourage improved lifestyle - low sodium diet, regular exercise 3. Continue monitor BP outside office, bring readings to next visit, if persistently >140/90 or new symptoms notify office sooner  Future consider taper off Amlodipine vs lower dose Lisinopril-HCTZ if well controlled now w weight loss 

## 2019-10-26 NOTE — Assessment & Plan Note (Addendum)
Controlled depression anxiety PTSD No longer followed by Psych Continues on Trintellix (financial asst from manufacturer, Buspirone, Trazodone  Next financial assistance app order of Trintellix 01/2020

## 2019-10-26 NOTE — Assessment & Plan Note (Signed)
Identified on RUQ Abd [place] 04/2019 with elevated LFT, overweight, HLD  Now improving HLD. Weight down. Stopped Alcohol again. LFT normalized  Will discuss in 6 months, repeat labs lipid cmet, consider repeat US vs GI in future

## 2019-10-26 NOTE — Assessment & Plan Note (Signed)
Encourage lifestyle diet exercise wt loss 

## 2019-10-28 ENCOUNTER — Telehealth: Payer: Medicare Other

## 2019-11-25 ENCOUNTER — Telehealth: Payer: Medicare Other

## 2019-12-09 ENCOUNTER — Encounter: Payer: Self-pay | Admitting: Family Medicine

## 2019-12-09 ENCOUNTER — Other Ambulatory Visit: Payer: Self-pay

## 2019-12-09 ENCOUNTER — Telehealth: Payer: Self-pay

## 2019-12-09 ENCOUNTER — Telehealth (INDEPENDENT_AMBULATORY_CARE_PROVIDER_SITE_OTHER): Payer: Medicare Other | Admitting: Family Medicine

## 2019-12-09 VITALS — BP 130/80

## 2019-12-09 DIAGNOSIS — J0110 Acute frontal sinusitis, unspecified: Secondary | ICD-10-CM

## 2019-12-09 MED ORDER — AMOXICILLIN-POT CLAVULANATE 875-125 MG PO TABS: 1.0000 | ORAL_TABLET | Freq: Two times a day (BID) | ORAL | 0 refills | Status: DC

## 2019-12-09 NOTE — Telephone Encounter (Signed)
Copied from [place] [phone]. Topic: General - Other >> Dec 09, 2019  2:34 PM Keene Breath wrote: Reason for CRM: Patient called to inform the doctor that he took his 1st covid test and it was negative.  He stated he will take another one 36 hrs. After the first.

## 2019-12-09 NOTE — Patient Instructions (Addendum)
Start Augmentin twice a day for 10 days  Consider COVID testing within 48 hours if not improved  Recommend quarantine.  Please schedule a Follow-up Appointment to: Return in about 1 week (around 12/16/2019), or if symptoms worsen or fail to improve, for sinusitis.  If you have any other questions or concerns, please feel free to call the office or send a message through [place]. You may also schedule an earlier appointment if necessary.  Additionally, you may be receiving a survey about your experience at our office within a few days to 1 week by e-mail or mail. We value your feedback.  Nobie Putnam, DO [place]

## 2019-12-09 NOTE — Progress Notes (Signed)
Subjective:    Patient ID: Jorge Jordan, male    DOB: May 29, 1960, 59 y.o.   MRN: [id number]  Jorge Jordan is a 59 y.o. male presenting on 12/09/2019 for Sinusitis (onset 4 days --HA more on Right side --facial pressure --runny nose but denies fever )  Virtual / Telehealth Encounter - Video Visit via MyChart The purpose of this virtual visit is to provide medical care while limiting exposure to the novel coronavirus (COVID19) for both patient and office staff.  Consent was obtained for remote visit:  Yes.   Answered questions that patient had about telehealth interaction:  Yes.   I discussed the limitations, risks, security and privacy concerns of performing an evaluation and management service by video/telephone. I also discussed with the patient that there may be a patient responsible charge related to this service. The patient expressed understanding and agreed to proceed.  Patient Location: Home Provider Location: [facility] (Office)   HPI   Sinusitis Reports onset 4 days ago with R sided headache, nasal congestion on R side [place] and head, he has tenderness to touch on that side localized. - admits mild cough but not persistent Tried OTC cold/flu medicine for congestion some relief Rarely has sinusitis, in past Augmentin worked well Denies cough dyspnea fever chills purulence, diarrhea nausea vomiting, chest pain  Health Maintenance: UTD COVID Vaccine Pfizer 07/01/19 and 07/22/19  Depression screen [facility] 2/9 10/25/2019 08/31/2019 04/26/2019  Decreased Interest 1 3 0  Down, Depressed, Hopeless 1 1 0  PHQ - 2 Score 2 4 0  Altered sleeping 0 0 0  Tired, decreased energy 0 3 1  Change in appetite 1 1 0  Feeling bad or failure about yourself  1 1 0  Trouble concentrating 0 0 0  Moving slowly or fidgety/restless 0 0 0  Suicidal thoughts 0 0 0  PHQ-9 Score 4 9 1   Difficult doing work/chores Somewhat difficult Very difficult Not difficult at all    Social  History   Tobacco Use  . Smoking status: Never Smoker  . Smokeless tobacco: Never Used  Substance Use Topics  . Alcohol use: Not Currently    Alcohol/week: 0.0 standard drinks  . Drug use: No    Review of Systems Per HPI unless specifically indicated above     Objective:    BP 130/80   Wt Readings from Last 3 Encounters:  10/25/19 196 lb 9.6 oz (89.2 kg)  08/31/19 205 lb 9.6 oz (93.3 kg)  04/26/19 214 lb (97.1 kg)    Physical Exam   Note examination was completely remotely via video observation objective data only  Gen - well-appearing, no acute distress or apparent pain, comfortable HEENT - eyes appear clear without discharge or redness Heart/Lungs - cannot examine virtually - observed no evidence of coughing or labored breathing. Skin - face visible today- no rash Neuro - awake, alert, oriented Psych - not anxious appearing   Results for orders placed or performed in visit on 10/19/19  Comprehensive metabolic panel  Result Value Ref Range   Glucose 107 (H) 65 - 99 mg/dL   BUN 14 6 - 24 mg/dL   Creatinine, Ser 1.25 0.76 - 1.27 mg/dL   GFR calc non Af Amer 63 >59 mL/min/1.73   GFR calc Af Amer 73 >59 mL/min/1.73   BUN/Creatinine Ratio 11 9 - 20   Sodium 137 134 - 144 mmol/L   Potassium 4.1 3.5 - 5.2 mmol/L   Chloride 99 96 -  106 mmol/L   CO2 23 20 - 29 mmol/L   Calcium 9.9 8.7 - 10.2 mg/dL   Total Protein 7.3 6.0 - 8.5 g/dL   Albumin 4.6 3.8 - 4.9 g/dL   Globulin, Total 2.7 1.5 - 4.5 g/dL   Albumin/Globulin Ratio 1.7 1.2 - 2.2   Bilirubin Total 0.7 0.0 - 1.2 mg/dL   Alkaline Phosphatase 80 48 - 121 IU/L   AST 24 0 - 40 IU/L   ALT 33 0 - 44 IU/L  PSA  Result Value Ref Range   Prostate Specific Ag, Serum 0.9 0.0 - 4.0 ng/mL  Lipid panel  Result Value Ref Range   Cholesterol, Total 198 100 - 199 mg/dL   Triglycerides 143 0 - 149 mg/dL   HDL 37 (L) >39 mg/dL   VLDL Cholesterol Cal 26 5 - 40 mg/dL   LDL Chol Calc (NIH) 135 (H) 0 - 99 mg/dL   Chol/HDL  Ratio 5.4 (H) 0.0 - 5.0 ratio  CBC with Differential/Platelet  Result Value Ref Range   WBC 4.6 3.4 - 10.8 x10E3/uL   RBC 4.83 4.14 - 5.80 x10E6/uL   Hemoglobin 16.7 13.0 - 17.7 g/dL   Hematocrit 46.0 37.5 - 51.0 %   MCV 95 79 - 97 fL   MCH 34.6 (H) 26.6 - 33.0 pg   MCHC 36.3 (H) 31 - 35 g/dL   RDW 11.7 11.6 - 15.4 %   Platelets 260 150 - 450 x10E3/uL   Neutrophils 53 Not Estab. %   Lymphs 29 Not Estab. %   Monocytes 14 Not Estab. %   Eos 3 Not Estab. %   Basos 1 Not Estab. %   Neutrophils Absolute 2.4 1 - 7 x10E3/uL   Lymphocytes Absolute 1.3 0 - 3 x10E3/uL   Monocytes Absolute 0.6 0 - 0 x10E3/uL   EOS (ABSOLUTE) 0.1 0.0 - 0.4 x10E3/uL   Basophils Absolute 0.1 0 - 0 x10E3/uL   Immature Granulocytes 0 Not Estab. %   Immature Grans (Abs) 0.0 0.0 - 0.1 x10E3/uL  Hemoglobin A1c  Result Value Ref Range   Hgb A1c MFr Bld 6.0 (H) 4.8 - 5.6 %   Est. average glucose Bld gHb Est-mCnc 126 mg/dL      Assessment & Plan:   Problem List Items Addressed This Visit    None    Visit Diagnoses    Acute non-recurrent frontal sinusitis    -  Primary   Relevant Medications   amoxicillin-clavulanate (AUGMENTIN) 875-125 MG tablet      Consistent with acute frontal vs maxillary sinusitis with worsening concern for bacterial infection, given highly localized pain pressure. Rare occurrence. No purulence, no fever No other respiratory symptoms No sick contacts Vaccinated against [employer] 06/2019 and 07/2019  Plan: 1. Start Augmentin 875-125mg  PO BID x 10 days 2. Supportive care with nasal saline OTC, hydration, warm compresses and sinus effleurage as demonstrated  Return criteria reviewed   Meds ordered this encounter  Medications  . amoxicillin-clavulanate (AUGMENTIN) 875-125 MG tablet    Sig: Take 1 tablet by mouth 2 (two) times daily. For 10 days    Dispense:  20 tablet    Refill:  0      Follow up plan: No follow-ups on file.  Follow-up: - Return in 1-2 weeks if not  improved  Patient verbalizes understanding with the above medical recommendations including the limitation of remote medical advice.  Specific follow-up and call-back criteria were given for patient to follow-up or seek medical  care more urgently if needed.  Total duration of direct patient care provided via video conference: 15 minutes   Nobie Putnam, [facility] 12/09/2019, 9:30 AM

## 2020-02-11 ENCOUNTER — Telehealth: Payer: Self-pay

## 2020-02-11 NOTE — Chronic Care Management (AMB) (Signed)
  Chronic Care Management   Note  02/11/2020 Name: Jorge Jordan MRN: [id number] DOB: 01-Aug-1960  [name] is a 59 y.o. year old male who is a primary care patient of Olin Hauser, DO. I reached out to [name] by phone today in response to a referral sent by Mr. Jeoffrey Massed PCP, Dr. Parks Ranger     Mr. Hirst was given information about Chronic Care Management services today including:  1. CCM service includes personalized support from designated clinical staff supervised by his physician, including individualized plan of care and coordination with other care providers 2. 24/7 contact phone numbers for assistance for urgent and routine care needs. 3. Service will only be billed when office clinical staff spend 20 minutes or more in a month to coordinate care. 4. Only one practitioner may furnish and bill the service in a calendar month. 5. The patient may stop CCM services at any time (effective at the end of the month) by phone call to the office staff. 6. The patient will be responsible for cost sharing (co-pay) of up to 20% of the service fee (after annual deductible is met).  Patient agreed to services and verbal consent obtained.   Follow up plan: Telephone appointment with care management team member scheduled for:02/23/1986  Noreene Larsson, [place], [place], [place] [address] Direct Dial: [phone] [email or web address] Website: [email or web address]

## 2020-02-16 ENCOUNTER — Telehealth: Payer: Self-pay | Admitting: Family Medicine

## 2020-02-16 NOTE — Telephone Encounter (Signed)
Recently submitted fax application to [place] Patient [employer] - patient is getting Trintellix 10mg  daily from financial support.  Completed his application, now received a new fax on 11/8 that says they are unable to process the application due to the following:  Patient information including: - # Of dependents in [place] of income documents - Prescription drug insurance - Patient declaration - Delivery address  Please notify patient that he may need to pick up this form to look at it in more detail and he can complete the rest by calling.  If he wants to just call the company directly he can call them at [phone] - to speak to their customer service - Mon-Fri 8am to [place], [facility] 02/16/2020, 5:58 PM

## 2020-02-17 NOTE — Telephone Encounter (Signed)
unable to reach the patient please reply patient with Dr.K's recommendation.

## 2020-02-21 ENCOUNTER — Other Ambulatory Visit: Payer: Self-pay | Admitting: Family Medicine

## 2020-02-21 NOTE — Telephone Encounter (Signed)
Spoke to the patient he will be here on 02/22/2020 to pick up the med list.

## 2020-02-21 NOTE — Telephone Encounter (Signed)
Called patient back since we did not hear from him.  He was aware of the missing information. He has not sent his part of the form in yet.  They also said that the company is missing his med list that should accompany the application.  I have re-printed a new med list and it is ready for pick up by patient. It is in my outbox at my desk. He will check before coming in to pick this up either later today or later this week.  Nobie Putnam, DO [facility] 02/21/2020, 12:22 PM

## 2020-02-24 ENCOUNTER — Telehealth: Payer: Medicare Other

## 2020-02-24 ENCOUNTER — Telehealth: Payer: Self-pay | Admitting: Licensed Clinical Social Worker

## 2020-02-24 NOTE — Telephone Encounter (Signed)
  Chronic Care Management    Clinical Social Work General Follow Up Note  02/24/2020 Name: Jorge Jordan MRN: [id number] DOB: 1960-12-15  Jorge Jordan is a 59 y.o. year old male who is a primary care patient of Olin Hauser, DO. The CCM team was consulted for assistance with Mental Health Counseling and Resources.   Review of patient status, including review of consultants reports, relevant laboratory and other test results, and collaboration with appropriate care team members and the patient's provider was performed as part of comprehensive patient evaluation and provision of chronic care management services.    LCSW completed CCM outreach attempt today but was unable to reach patient successfully. A HIPPA compliant voice message was left encouraging patient to return call once available. LCSW will ask Scheduling Care Guide to reschedule CCM SW appointment with patient as well.  Outpatient Encounter Medications as of 02/24/2020  Medication Sig  . acetaminophen (TYLENOL) 500 MG tablet Take 1,000 mg by mouth every 6 (six) hours as needed.  Marland Kitchen amLODipine (NORVASC) 5 MG tablet Take 1 tablet (5 mg total) by mouth daily.  Marland Kitchen aspirin EC 81 MG tablet Take 81 mg by mouth daily.  . busPIRone (BUSPAR) 10 MG tablet Take 1 tablet (10 mg total) by mouth 3 (three) times daily.  Marland Kitchen lisinopril-hydrochlorothiazide (ZESTORETIC) 20-25 MG tablet Take 1 tablet by mouth daily.  . pravastatin (PRAVACHOL) 40 MG tablet Take 1 tablet (40 mg total) by mouth daily.  . sildenafil (REVATIO) 20 MG tablet Take 3-5 tablets as needed 30 prior to sex.  . traZODone (DESYREL) 100 MG tablet Take 1 tablet (100 mg total) by mouth at bedtime.  . vortioxetine HBr (TRINTELLIX) 10 MG TABS tablet Take 1 tablet (10 mg total) by mouth daily.   No facility-administered encounter medications on file as of 02/24/2020.    Follow Up Plan: [facility] will reach out to patient to reschedule appointment.   Eula Fried, BSW, MSW, [email or web address] Phone: [phone]

## 2020-04-26 ENCOUNTER — Ambulatory Visit: Payer: Medicare Other | Admitting: Family Medicine

## 2020-05-03 ENCOUNTER — Encounter: Payer: Self-pay | Admitting: Emergency Medicine

## 2020-05-03 ENCOUNTER — Emergency Department: Payer: Medicare HMO

## 2020-05-03 ENCOUNTER — Other Ambulatory Visit: Payer: Self-pay

## 2020-05-03 ENCOUNTER — Emergency Department
Admission: EM | Admit: 2020-05-03 | Discharge: 2020-05-03 | Disposition: A | Discharge status: Home / Self Care | Payer: Medicare HMO | Attending: Emergency Medicine | Admitting: Emergency Medicine

## 2020-05-03 DIAGNOSIS — S76111A Strain of right quadriceps muscle, fascia and tendon, initial encounter: Secondary | ICD-10-CM | POA: Insufficient documentation

## 2020-05-03 DIAGNOSIS — M25561 Pain in right knee: Secondary | ICD-10-CM | POA: Diagnosis not present

## 2020-05-03 DIAGNOSIS — Z7982 Long term (current) use of aspirin: Secondary | ICD-10-CM | POA: Insufficient documentation

## 2020-05-03 DIAGNOSIS — Z20822 Contact with and (suspected) exposure to covid-19: Secondary | ICD-10-CM | POA: Insufficient documentation

## 2020-05-03 DIAGNOSIS — I10 Essential (primary) hypertension: Secondary | ICD-10-CM | POA: Insufficient documentation

## 2020-05-03 DIAGNOSIS — Z79899 Other long term (current) drug therapy: Secondary | ICD-10-CM | POA: Insufficient documentation

## 2020-05-03 DIAGNOSIS — W000XXA Fall on same level due to ice and snow, initial encounter: Secondary | ICD-10-CM | POA: Diagnosis not present

## 2020-05-03 DIAGNOSIS — S76101A Unspecified injury of right quadriceps muscle, fascia and tendon, initial encounter: Secondary | ICD-10-CM | POA: Diagnosis not present

## 2020-05-03 LAB — SARS CORONAVIRUS 2 BY RT PCR (HOSPITAL ORDER, PERFORMED IN ~~LOC~~ HOSPITAL LAB): SARS Coronavirus 2: NEGATIVE

## 2020-05-03 MED ORDER — OXYCODONE-ACETAMINOPHEN 5-325 MG PO TABS
1.0000 | ORAL_TABLET | Freq: Once | ORAL | Status: AC
  Administered 2020-05-03: 1 via ORAL
  Filled 2020-05-03: qty 1

## 2020-05-03 NOTE — ED Provider Notes (Signed)
[facility] Emergency Department Provider Note  ____________________________________________  Time seen: Approximately 10:10 PM  I have reviewed the triage vital signs and the nursing notes.   HISTORY  Chief Complaint Knee Pain    HPI Jorge Jordan is a 60 y.o. male that presents to the emergency department for evaluation of right knee pain.  Patient fell on the ice today.  He is unsure exactly how he landed.  He has been unable to raise his right leg since.  Pain is directly above the patella.   Past Medical History:  Diagnosis Date  . Allergy   . Anxiety   . Arthritis   . Foot deformity    foot arthrodesis L foot  . Hyperlipidemia   . Osteoporosis   . Shoulder bursitis    both   . Tendinitis    elbow    Patient Active Problem List   Diagnosis Date Noted  . Hepatic steatosis 04/29/2019  . Overweight (BMI 25.0-29.9) 04/26/2019  . Dyshidrotic eczema 01/26/2018  . Primary osteoarthritis involving multiple joints 07/21/2017  . PTSD (post-traumatic stress disorder) 01/20/2017  . Vitamin D deficiency 01/20/2017  . Osteoarthritis of left ankle and foot 01/20/2017  . Screening for prostate cancer 01/20/2017  . Screening for colon cancer 01/20/2017  . Bilateral carpal tunnel syndrome 04/11/2016  . Elevated serum creatinine 04/11/2016  . Elevated transaminase level 08/09/2015  . Major depressive disorder, recurrent episode with anxious distress ([place]) 07/28/2015  . Arthritis of shoulder 07/28/2015  . S/P ankle arthrodesis 07/28/2015  . Anxiety 06/20/2015  . History of alcohol abuse 06/20/2015  . Pre-diabetes 08/11/2012  . Parasomnia 08/11/2012  . Chronic pain 02/13/2012  . Hay fever 02/13/2012  . Essential hypertension 02/13/2012  . Hyperlipidemia 02/13/2012    Past Surgical History:  Procedure Laterality Date  . FOOT SURGERY Left 2001   Triple arthrodesis  . ROTATOR CUFF REPAIR Left   . SHOULDER FUSION SURGERY    . WRIST SURGERY      ganglion cyst    Prior to Admission medications   Medication Sig Start Date End Date Taking? Authorizing Provider  acetaminophen (TYLENOL) 500 MG tablet Take 1,000 mg by mouth every 6 (six) hours as needed.    [provider]  amLODipine (NORVASC) 5 MG tablet Take 1 tablet (5 mg total) by mouth daily. 10/25/19   Karamalegos, Devonne Doughty, DO  aspirin EC 81 MG tablet Take 81 mg by mouth daily.    [provider]  busPIRone (BUSPAR) 10 MG tablet Take 1 tablet (10 mg total) by mouth 3 (three) times daily. 10/25/19   Karamalegos, Devonne Doughty, DO  lisinopril-hydrochlorothiazide (ZESTORETIC) 20-25 MG tablet Take 1 tablet by mouth daily. 10/25/19   Karamalegos, Devonne Doughty, DO  pravastatin (PRAVACHOL) 40 MG tablet Take 1 tablet (40 mg total) by mouth daily. 10/25/19   Karamalegos, Devonne Doughty, DO  sildenafil (REVATIO) 20 MG tablet Take 3-5 tablets as needed 30 prior to sex. 10/25/19   Karamalegos, Devonne Doughty, DO  traZODone (DESYREL) 100 MG tablet Take 1 tablet (100 mg total) by mouth at bedtime. 10/25/19   Karamalegos, Devonne Doughty, DO  vortioxetine HBr (TRINTELLIX) 10 MG TABS tablet Take 1 tablet (10 mg total) by mouth daily. 07/02/17   Olin Hauser, DO    Allergies Patient has no known allergies.  Family History  Problem Relation Age of Onset  . Hyperlipidemia Mother   . Hypertension Mother   . Diabetes Mother   . Depression Mother   .  Hypertension Father   . Hyperlipidemia Father   . Cancer Father        skin cancer  . Hypertension Brother   . Diabetes Maternal Grandmother   . Heart attack Maternal Grandfather     Social History Social History   Tobacco Use  . Smoking status: Never Smoker  . Smokeless tobacco: Never Used  Substance Use Topics  . Alcohol use: Not Currently    Alcohol/week: 0.0 standard drinks  . Drug use: No     Review of Systems  Cardiovascular: No chest pain. Respiratory: No SOB. Gastrointestinal: No abdominal pain.  No vomiting.   Musculoskeletal: Positive for knee pain. Skin: Negative for rash, abrasions, lacerations, ecchymosis. Neurological: Negative for headaches, numbness or tingling   ____________________________________________   PHYSICAL EXAM:  VITAL SIGNS: ED Triage Vitals [05/03/20 1758]  Enc Vitals Group     BP (!) 142/94     Pulse Rate 77     Resp 17     Temp 97.7 F (36.5 C)     Temp Source Oral     SpO2 98 %     Weight 200 lb (90.7 kg)     Height 5\' 8"  (1.727 m)     Head Circumference      Peak Flow      Pain Score 8     Pain Loc      Pain Edu?      Excl. in [place]?      Constitutional: Alert and oriented. Well appearing and in no acute distress. Eyes: Conjunctivae are normal. PERRL. EOMI. Head: Atraumatic. ENT:      Ears:      Nose: No congestion/rhinnorhea.      Mouth/Throat: Mucous membranes are moist.  Neck: No stridor.  Cardiovascular: Normal rate, regular rhythm.  Good peripheral circulation. Respiratory: Normal respiratory effort without tachypnea or retractions. Lungs CTAB. Good air entry to the bases with no decreased or absent breath sounds. Musculoskeletal: No gross deformities appreciated.  Defect palpated just above the patella.  Unable to actively extend right knee. Neurologic:  Normal speech and language. No gross focal neurologic deficits are appreciated.  Skin:  Skin is warm, dry and intact. No rash noted. Psychiatric: Mood and affect are normal. Speech and behavior are normal. Patient exhibits appropriate insight and judgement.   ____________________________________________   LABS (all labs ordered are listed, but only abnormal results are displayed)  Labs Reviewed  SARS CORONAVIRUS 2 BY RT PCR (HOSPITAL ORDER, [facility])   ____________________________________________  EKG   ____________________________________________  RADIOLOGY Robinette Haines, personally viewed and evaluated these images (plain radiographs) as part of  my medical decision making, as well as reviewing the written report by the radiologist.  DG Knee Complete 4 Views Right  Result Date: 05/03/2020 CLINICAL DATA:  Right knee pain, fall EXAM: RIGHT KNEE - COMPLETE 4+ VIEW COMPARISON:  None. FINDINGS: No evidence of fracture, dislocation, or joint effusion. No evidence of arthropathy or other focal bone abnormality. Soft tissues are unremarkable. IMPRESSION: Negative. Electronically Signed   By: Rolm Baptise M.D.   On: 05/03/2020 18:25    ____________________________________________    PROCEDURES  Procedure(s) performed:    Procedures    Medications  oxyCODONE-acetaminophen (PERCOCET/ROXICET) 5-325 MG per tablet 1 tablet (1 tablet Oral Given 05/03/20 2256)     ____________________________________________   INITIAL IMPRESSION / ASSESSMENT AND PLAN / ED COURSE  Pertinent labs & imaging results that were available during my care of the  patient were reviewed by me and considered in my medical decision making (see chart for details).  Review of the [place] CSRS was performed in accordance of the [place] prior to dispensing any controlled drugs.   Patient's diagnosis is consistent with quadriceps rupture.  Vital signs and exam are reassuring.  X-ray negative for acute bony abnormality.  Exam is consistent with quadriceps rupture.  Dr. Rudene Christians was consulted and plans to take the patient to surgery tomorrow.  He would like patient to see him in his office tomorrow at 8 AM.  Patient is in agreement with this plan.  He will be n.p.o. after midnight.  Knee immobilizer was placed and crutches were given.  Patient is to follow up with orthopedics as directed. Patient is given ED precautions to return to the ED for any worsening or new symptoms.   Jorge Jordan was evaluated in Emergency Department on 05/03/2020 for the symptoms described in the history of present illness. He was evaluated in the context of the global COVID-19 pandemic, which necessitated  consideration that the patient might be at risk for infection with the SARS-CoV-2 virus that causes COVID-19. Institutional protocols and algorithms that pertain to the evaluation of patients at risk for COVID-19 are in a state of rapid change based on information released by regulatory bodies including the CDC and federal and state organizations. These policies and algorithms were followed during the patient's care in the ED.  ____________________________________________  FINAL CLINICAL IMPRESSION(S) / ED DIAGNOSES  Final diagnoses:  Rupture of right quadriceps muscle, initial encounter      NEW MEDICATIONS STARTED DURING THIS VISIT:  ED Discharge Orders    None          This chart was dictated using voice recognition software/Dragon. Despite best efforts to proofread, errors can occur which can change the meaning. Any change was purely unintentional.    Laban Emperor, PA-C 05/03/20 2316    Blake Divine, MD 05/04/20 [phone]

## 2020-05-03 NOTE — Discharge Instructions (Addendum)
Do not eat or drink after midnight.  Please go to Dr. Rudene Christians office at 8 AM.  He plans to do surgery for your quadriceps rupture tomorrow.

## 2020-05-03 NOTE — ED Triage Notes (Addendum)
Pt comes into the ED via POV c/o right knee pain after slipping on the ice.  PT has even and unlabored respirations at this time.  Swelling noted above the right knee.

## 2020-05-04 ENCOUNTER — Ambulatory Visit
Admission: RE | Admit: 2020-05-04 | Discharge: 2020-05-04 | Disposition: A | Discharge status: Home / Self Care | Payer: Medicare HMO | Attending: Orthopedic Surgery | Admitting: Orthopedic Surgery

## 2020-05-04 ENCOUNTER — Ambulatory Visit: Payer: Medicare HMO | Admitting: Anesthesiology

## 2020-05-04 ENCOUNTER — Other Ambulatory Visit: Payer: Self-pay | Admitting: Orthopedic Surgery

## 2020-05-04 ENCOUNTER — Encounter
Admission: RE | Disposition: A | Discharge status: Home / Self Care | Payer: Self-pay | Source: Home / Self Care | Attending: Orthopedic Surgery

## 2020-05-04 ENCOUNTER — Encounter: Payer: Self-pay | Admitting: Orthopedic Surgery

## 2020-05-04 DIAGNOSIS — Z79899 Other long term (current) drug therapy: Secondary | ICD-10-CM | POA: Insufficient documentation

## 2020-05-04 DIAGNOSIS — S76111A Strain of right quadriceps muscle, fascia and tendon, initial encounter: Secondary | ICD-10-CM | POA: Diagnosis not present

## 2020-05-04 DIAGNOSIS — Z7982 Long term (current) use of aspirin: Secondary | ICD-10-CM | POA: Insufficient documentation

## 2020-05-04 HISTORY — PX: REPAIR QUADRICEPS/HAMSTRING MUSCLES: SHX6563

## 2020-05-04 SURGERY — REPAIR, MUSCLE, QUADRICEPS OR HAMSTRING
Anesthesia: General | Site: Knee | Laterality: Right

## 2020-05-04 MED ORDER — ORAL CARE MOUTH RINSE: 15.0000 mL | Freq: Once | OROMUCOSAL | Status: AC

## 2020-05-04 MED ORDER — DEXAMETHASONE SODIUM PHOSPHATE 10 MG/ML IJ SOLN
INTRAMUSCULAR | Status: DC | PRN
  Administered 2020-05-04: 10 mg via INTRAVENOUS

## 2020-05-04 MED ORDER — MEPERIDINE HCL 50 MG/ML IJ SOLN: 6.2500 mg | INTRAMUSCULAR | Status: DC | PRN

## 2020-05-04 MED ORDER — HYDROCODONE-ACETAMINOPHEN 7.5-325 MG PO TABS
ORAL_TABLET | ORAL | Status: AC
  Administered 2020-05-04: 2 via ORAL
  Filled 2020-05-04: qty 2

## 2020-05-04 MED ORDER — BUPIVACAINE HCL (PF) 0.5 % IJ SOLN
INTRAMUSCULAR | Status: AC
  Filled 2020-05-04: qty 30

## 2020-05-04 MED ORDER — METOCLOPRAMIDE HCL 5 MG/ML IJ SOLN: 5.0000 mg | Freq: Three times a day (TID) | INTRAMUSCULAR | Status: DC | PRN

## 2020-05-04 MED ORDER — NEOMYCIN-POLYMYXIN B GU 40-200000 IR SOLN
Status: DC | PRN
  Administered 2020-05-04: 2 mL

## 2020-05-04 MED ORDER — CHLORHEXIDINE GLUCONATE 0.12 % MT SOLN
15.0000 mL | Freq: Once | OROMUCOSAL | Status: AC
  Administered 2020-05-04: 15 mL via OROMUCOSAL

## 2020-05-04 MED ORDER — PROPOFOL 10 MG/ML IV BOLUS
INTRAVENOUS | Status: DC | PRN
  Administered 2020-05-04: 200 mg via INTRAVENOUS

## 2020-05-04 MED ORDER — MIDAZOLAM HCL 2 MG/2ML IJ SOLN
INTRAMUSCULAR | Status: DC | PRN
  Administered 2020-05-04: 2 mg via INTRAVENOUS

## 2020-05-04 MED ORDER — HYDROMORPHONE HCL 1 MG/ML IJ SOLN
INTRAMUSCULAR | Status: AC
  Filled 2020-05-04: qty 1

## 2020-05-04 MED ORDER — ROCURONIUM BROMIDE 100 MG/10ML IV SOLN
INTRAVENOUS | Status: DC | PRN
  Administered 2020-05-04: 30 mg via INTRAVENOUS
  Administered 2020-05-04: 40 mg via INTRAVENOUS
  Administered 2020-05-04: 50 mg via INTRAVENOUS

## 2020-05-04 MED ORDER — CHLORHEXIDINE GLUCONATE 0.12 % MT SOLN
OROMUCOSAL | Status: AC
  Filled 2020-05-04: qty 15

## 2020-05-04 MED ORDER — MIDAZOLAM HCL 2 MG/2ML IJ SOLN
INTRAMUSCULAR | Status: AC
  Filled 2020-05-04: qty 2

## 2020-05-04 MED ORDER — ONDANSETRON HCL 4 MG/2ML IJ SOLN: 4.0000 mg | Freq: Four times a day (QID) | INTRAMUSCULAR | Status: DC | PRN

## 2020-05-04 MED ORDER — CHLORHEXIDINE GLUCONATE 4 % EX LIQD: 60.0000 mL | Freq: Once | CUTANEOUS | Status: DC

## 2020-05-04 MED ORDER — POVIDONE-IODINE 10 % EX SWAB: 2.0000 "application " | Freq: Once | CUTANEOUS | Status: DC

## 2020-05-04 MED ORDER — CEFAZOLIN SODIUM-DEXTROSE 2-4 GM/100ML-% IV SOLN
INTRAVENOUS | Status: AC
  Filled 2020-05-04: qty 100

## 2020-05-04 MED ORDER — FENTANYL CITRATE (PF) 100 MCG/2ML IJ SOLN
INTRAMUSCULAR | Status: DC | PRN
  Administered 2020-05-04 (×4): 50 ug via INTRAVENOUS

## 2020-05-04 MED ORDER — SODIUM CHLORIDE 0.9 % IV SOLN: INTRAVENOUS | Status: DC

## 2020-05-04 MED ORDER — HYDROCODONE-ACETAMINOPHEN 7.5-325 MG PO TABS: 1.0000 | ORAL_TABLET | ORAL | Status: DC | PRN

## 2020-05-04 MED ORDER — MEPERIDINE HCL 50 MG/ML IJ SOLN
INTRAMUSCULAR | Status: AC
  Administered 2020-05-04: 12.5 mg via INTRAVENOUS
  Filled 2020-05-04: qty 1

## 2020-05-04 MED ORDER — NEOMYCIN-POLYMYXIN B GU 40-200000 IR SOLN
Status: AC
  Filled 2020-05-04: qty 2

## 2020-05-04 MED ORDER — CEFAZOLIN SODIUM-DEXTROSE 2-4 GM/100ML-% IV SOLN
2.0000 g | INTRAVENOUS | Status: AC
  Administered 2020-05-04: 2 g via INTRAVENOUS

## 2020-05-04 MED ORDER — PROMETHAZINE HCL 25 MG/ML IJ SOLN: 6.2500 mg | INTRAMUSCULAR | Status: DC | PRN

## 2020-05-04 MED ORDER — LACTATED RINGERS IV SOLN: INTRAVENOUS | Status: DC

## 2020-05-04 MED ORDER — LIDOCAINE HCL (CARDIAC) PF 100 MG/5ML IV SOSY
PREFILLED_SYRINGE | INTRAVENOUS | Status: DC | PRN
  Administered 2020-05-04: 80 mg via INTRAVENOUS

## 2020-05-04 MED ORDER — EPINEPHRINE PF 1 MG/ML IJ SOLN
INTRAMUSCULAR | Status: AC
  Filled 2020-05-04: qty 1

## 2020-05-04 MED ORDER — PROPOFOL 10 MG/ML IV BOLUS
INTRAVENOUS | Status: AC
  Filled 2020-05-04: qty 20

## 2020-05-04 MED ORDER — HYDROMORPHONE HCL 1 MG/ML IJ SOLN
INTRAMUSCULAR | Status: DC | PRN
  Administered 2020-05-04: 1 mg via INTRAVENOUS

## 2020-05-04 MED ORDER — METOCLOPRAMIDE HCL 10 MG PO TABS
5.0000 mg | ORAL_TABLET | Freq: Three times a day (TID) | ORAL | Status: DC | PRN
Start: 2020-05-04 — End: 2020-05-04

## 2020-05-04 MED ORDER — ONDANSETRON HCL 4 MG PO TABS: 4.0000 mg | ORAL_TABLET | Freq: Four times a day (QID) | ORAL | Status: DC | PRN

## 2020-05-04 MED ORDER — ACETAMINOPHEN 10 MG/ML IV SOLN
INTRAVENOUS | Status: DC | PRN
  Administered 2020-05-04: 1000 mg via INTRAVENOUS

## 2020-05-04 MED ORDER — HYDROCODONE-ACETAMINOPHEN 7.5-325 MG PO TABS: 1.0000 | ORAL_TABLET | ORAL | 0 refills | Status: DC | PRN

## 2020-05-04 MED ORDER — FENTANYL CITRATE (PF) 100 MCG/2ML IJ SOLN
25.0000 ug | INTRAMUSCULAR | Status: DC | PRN
  Administered 2020-05-04: 50 ug via INTRAVENOUS

## 2020-05-04 MED ORDER — FENTANYL CITRATE (PF) 100 MCG/2ML IJ SOLN
INTRAMUSCULAR | Status: AC
  Administered 2020-05-04: 50 ug via INTRAVENOUS
  Filled 2020-05-04: qty 2

## 2020-05-04 MED ORDER — ACETAMINOPHEN 10 MG/ML IV SOLN
INTRAVENOUS | Status: AC
  Filled 2020-05-04: qty 100

## 2020-05-04 MED ORDER — CEFAZOLIN SODIUM-DEXTROSE 2-4 GM/100ML-% IV SOLN: 2.0000 g | INTRAVENOUS | Status: DC

## 2020-05-04 MED ORDER — FENTANYL CITRATE (PF) 100 MCG/2ML IJ SOLN
INTRAMUSCULAR | Status: AC
  Filled 2020-05-04: qty 2

## 2020-05-04 MED ORDER — SUGAMMADEX SODIUM 500 MG/5ML IV SOLN
INTRAVENOUS | Status: DC | PRN
  Administered 2020-05-04: 200 mg via INTRAVENOUS

## 2020-05-04 MED ORDER — ONDANSETRON HCL 4 MG/2ML IJ SOLN
INTRAMUSCULAR | Status: DC | PRN
  Administered 2020-05-04: 4 mg via INTRAVENOUS

## 2020-05-04 SURGICAL SUPPLY — 44 items
ANCHOR SUT BIO SW 4.75X19.1 (Anchor) ×2 IMPLANT
BLADE SURG SZ10 CARB STEEL (BLADE) ×2 IMPLANT
BNDG COHESIVE 4X5 TAN STRL (GAUZE/BANDAGES/DRESSINGS) ×2 IMPLANT
BNDG ELASTIC 4X5.8 VLCR NS LF (GAUZE/BANDAGES/DRESSINGS) IMPLANT
BNDG ELASTIC 6X5.8 VLCR NS LF (GAUZE/BANDAGES/DRESSINGS) IMPLANT
BNDG ESMARK 4X12 TAN STRL LF (GAUZE/BANDAGES/DRESSINGS) ×2 IMPLANT
BRACE KNEE POST OP SHORT (BRACE) ×2 IMPLANT
CANISTER PREVENA 45 (CANNISTER) ×2 IMPLANT
CANISTER SUCT 1200ML W/VALVE (MISCELLANEOUS) ×2 IMPLANT
CHLORAPREP W/TINT 26 (MISCELLANEOUS) ×2 IMPLANT
COVER WAND RF STERILE (DRAPES) ×2 IMPLANT
CUFF TOURN 30 STER DUAL PORT (MISCELLANEOUS) ×2 IMPLANT
DRESSING PREVENA PLUS CUSTOM (GAUZE/BANDAGES/DRESSINGS) ×1 IMPLANT
DRSG PREVENA PLUS CUSTOM (GAUZE/BANDAGES/DRESSINGS) ×2
ELECT REM PT RETURN 9FT ADLT (ELECTROSURGICAL) ×2
ELECTRODE REM PT RTRN 9FT ADLT (ELECTROSURGICAL) ×1 IMPLANT
GAUZE SPONGE 4X4 12PLY STRL (GAUZE/BANDAGES/DRESSINGS) ×2 IMPLANT
GAUZE XEROFORM 1X8 LF (GAUZE/BANDAGES/DRESSINGS) ×2 IMPLANT
GLOVE BIOGEL PI IND STRL 9 (GLOVE) ×1 IMPLANT
GLOVE BIOGEL PI INDICATOR 9 (GLOVE) ×1
GLOVE SURG SYN 9.0  PF PI (GLOVE) ×1
GLOVE SURG SYN 9.0 PF PI (GLOVE) ×1 IMPLANT
GOWN SRG 2XL LVL 4 RGLN SLV (GOWNS) ×1 IMPLANT
GOWN STRL NON-REIN 2XL LVL4 (GOWNS) ×1
GOWN STRL REUS W/ TWL LRG LVL3 (GOWN DISPOSABLE) ×1 IMPLANT
GOWN STRL REUS W/TWL LRG LVL3 (GOWN DISPOSABLE) ×1
KIT TURNOVER KIT A (KITS) ×2 IMPLANT
MANIFOLD NEPTUNE II (INSTRUMENTS) ×2 IMPLANT
NS IRRIG 500ML POUR BTL (IV SOLUTION) ×2 IMPLANT
PACK EXTREMITY ARMC (MISCELLANEOUS) ×2 IMPLANT
PAD ABD DERMACEA PRESS 5X9 (GAUZE/BANDAGES/DRESSINGS) ×4 IMPLANT
PAD CAST CTTN 4X4 STRL (SOFTGOODS) ×2 IMPLANT
PADDING CAST COTTON 4X4 STRL (SOFTGOODS) ×2
PASSER SUT SWANSON 36MM LOOP (INSTRUMENTS) IMPLANT
SPONGE LAP 18X18 RF (DISPOSABLE) IMPLANT
STAPLER SKIN PROX 35W (STAPLE) ×2 IMPLANT
STOCKINETTE M/LG 89821 (MISCELLANEOUS) ×2 IMPLANT
SUT FIBERWIRE #2 38 T-5 BLUE (SUTURE) ×2
SUT VIC AB 1 CT1 36 (SUTURE) ×2 IMPLANT
SUT VIC AB 2-0 CT1 27 (SUTURE) ×1
SUT VIC AB 2-0 CT1 TAPERPNT 27 (SUTURE) ×1 IMPLANT
SUTURE FIBERWR #2 38 T-5 BLUE (SUTURE) ×1 IMPLANT
SYS INTERNAL BRACE KNEE (Miscellaneous) ×2 IMPLANT
SYSTEM INTERNAL BRACE KNEE (Miscellaneous) ×1 IMPLANT

## 2020-05-04 NOTE — Anesthesia Postprocedure Evaluation (Signed)
Anesthesia Post Note  Patient: Jorge Jordan  Procedure(s) Performed: REPAIR QUADRICEPS/HAMSTRING MUSCLES (Right Knee)  Patient location during evaluation: PACU Anesthesia Type: General Level of consciousness: awake and alert Pain management: pain level controlled Vital Signs Assessment: post-procedure vital signs reviewed and stable Respiratory status: spontaneous breathing, nonlabored ventilation, respiratory function stable and patient connected to nasal cannula oxygen Cardiovascular status: blood pressure returned to baseline and stable Postop Assessment: no apparent nausea or vomiting Anesthetic complications: no   No complications documented.   Last Vitals:  Vitals:   05/04/20 1411 05/04/20 1450  BP: (!) 153/90 (!) (P) 146/87  Pulse: (!) 120 (!) (P) 116  Resp: 16 (P) 16  Temp:  (!) (P) 36.1 C  SpO2: 98% (P) 97%    Last Pain:  Vitals:   05/04/20 1450  TempSrc: (P) Temporal  PainSc: (P) 5                  Martha Clan

## 2020-05-04 NOTE — Anesthesia Procedure Notes (Signed)
Procedure Name: Intubation Date/Time: 05/04/2020 11:35 AM Performed by: Nelda Marseille, CRNA Pre-anesthesia Checklist: Patient identified, Emergency Drugs available, Suction available and Patient being monitored Patient Re-evaluated:Patient Re-evaluated prior to induction Oxygen Delivery Method: Circle system utilized Preoxygenation: Pre-oxygenation with 100% oxygen Induction Type: IV induction Ventilation: Mask ventilation without difficulty and Oral airway inserted - appropriate to patient size Laryngoscope Size: McGraph and 3 Grade View: Grade I Tube type: Oral Tube size: 7.5 mm Number of attempts: 1 Airway Equipment and Method: Stylet and Video-laryngoscopy Placement Confirmation: ETT inserted through vocal cords under direct vision,  positive ETCO2 and breath sounds checked- equal and bilateral Secured at: 22 cm Tube secured with: Tape Dental Injury: Teeth and Oropharynx as per pre-operative assessment

## 2020-05-04 NOTE — Discharge Instructions (Addendum)
AMBULATORY SURGERY  DISCHARGE INSTRUCTIONS   1) The drugs that you were given will stay in your system until tomorrow so for the next 24 hours you should not:  A) Drive an automobile B) Make any legal decisions C) Drink any alcoholic beverage   2) You may resume regular meals tomorrow.  Today it is better to start with liquids and gradually work up to solid foods.  You may eat anything you prefer, but it is better to start with liquids, then soup and crackers, and gradually work up to solid foods.   3) Please notify your doctor immediately if you have any unusual bleeding, trouble breathing, redness and pain at the surgery site, drainage, fever, or pain not relieved by medication. 4)   5) Additional Instructions: Hospital main #  [phone]     Pain medicine as directed. Keep knee brace on at all times and do not bend knee. With knee brace locked in extension he can put as much weight as you want on the right leg. Incisional wound VAC is to help wound healing if it stops working just leave it in place until return visit and hold center buttons to make it stop beeping. Call office if you are having problems. [phone]

## 2020-05-04 NOTE — Transfer of Care (Signed)
Immediate Anesthesia Transfer of Care Note  Patient: Jorge Jordan  Procedure(s) Performed: REPAIR QUADRICEPS/HAMSTRING MUSCLES (Right Knee)  Patient Location: PACU  Anesthesia Type:General  Level of Consciousness: awake, alert  and oriented  Airway & Oxygen Therapy: Patient Spontanous Breathing and Patient connected to face mask oxygen  Post-op Assessment: Report given to RN and Post -op Vital signs reviewed and stable  Post vital signs: Reviewed and stable  Last Vitals:  Vitals Value Taken Time  BP 157/97 05/04/20 1304  Temp    Pulse 123 05/04/20 1306  Resp 16 05/04/20 1306  SpO2 96 % 05/04/20 1306  Vitals shown include unvalidated device data.  Last Pain:  Vitals:   05/04/20 0947  TempSrc: Tympanic  PainSc: 6          Complications: No complications documented.

## 2020-05-04 NOTE — Op Note (Signed)
05/04/2020  1:04 PM  PATIENT:  Jorge Jordan  60 y.o. male  PRE-OPERATIVE DIAGNOSIS:  Right Quadricep rupture  POST-OPERATIVE DIAGNOSIS:  Right Quadricep rupture  PROCEDURE:  Procedure(s) with comments: REPAIR QUADRICEPS/HAMSTRING MUSCLES (Right) - RIGHT QUADRICEPS Repair of quadriceps tendon only SURGEON: Laurene Footman, MD  ASSISTANTS: None  ANESTHESIA:   general  EBL:  Total I/O In: 1500 [I.V.:1300; IV Piggyback:200] Out: 10 [Blood:10]  BLOOD ADMINISTERED:none  DRAINS: Incisional wound VAC   LOCAL MEDICATIONS USED:  NONE  SPECIMEN:  No Specimen  DISPOSITION OF SPECIMEN:  N/A  COUNTS:  YES  TOURNIQUET:   Total Tourniquet Time Documented: Thigh (Right) - 48 minutes Total: Thigh (Right) - 48 minutes   IMPLANTS: 3 swivel lock Arthrex anchors  DICTATION: .Dragon Dictation patient was brought to the operating room and after adequate anesthesia was obtained the right leg was prepped and draped you sterile fashion with tourniquet applied the upper leg.  There is a abrasion over the patellar tendon area of the knee.  After appropriate patient identification and timeout procedures were completed tourniquet was raised and incision made over the distal palpable stump of the quadriceps to the proximal pole of the patella.  After exposing the quadriceps tendon rupture hematoma was evacuated from the knee avulsed tendon off the patella was debrided for better visualization.  A small incision was made over the tibial tubercle and skin undermined to subsequently allow for passage of a suture the first suture was a fiber tape passed through the quadriceps tendon woven across from medial to lateral with the strands brought down to the tibial tubercle subcutaneous through the subcutaneous tunnel this avoided making a larger incision through the damaged skin over the patellar tendon.  This was left alone at present to allow for fixation proximally.  2 FiberWire sutures were passed medial  and lateral woven into the tendon with good grasping sutures these were then attached to the patella with 2 drill holes made the swivel lock anchor placed in with the quadriceps tendon held in a reduced position these were tightened down with no gap present.  The excess suture was removed but the suture from the swivel lock was subsequently woven in slightly more medial ends more lateral to the respective suture anchors to give additional stability to the repair.  The suture that had been placed through the tendon initially those was brought down and had been brought down under subcutaneously tension was held after drill hole made tapping and placing of the swivel lock with a suture in place and this reinforce the repair such that a knee could be flexed at 90 degrees with no gap at the quadriceps tendon site.  This point the wounds were thoroughly irrigated and the wounds closed with a running #1 Vicryl to help repair the medial and lateral capsule where it had torn followed by 2-0 Vicryl subcutaneously and skin staples followed by incisional wound VAC 6 inch Ace wrap and hinged knee brace locked in extension  PLAN OF CARE: Discharge to home after PACU  PATIENT DISPOSITION:  PACU - hemodynamically stable.

## 2020-05-04 NOTE — Anesthesia Preprocedure Evaluation (Signed)
Anesthesia Evaluation  Patient identified by MRN, date of birth, ID band Patient awake    Reviewed: Allergy & Precautions, H&P , NPO status , Patient's Chart, lab work & pertinent test results, reviewed documented beta blocker date and time   History of Anesthesia Complications Negative for: history of anesthetic complications  Airway Mallampati: II  TM Distance: >3 FB Neck ROM: full    Dental  (+) Dental Advidsory Given, Caps, Missing, Teeth Intact   Pulmonary neg shortness of breath, asthma (allergen induced) , neg sleep apnea, neg recent URI,    Pulmonary exam normal breath sounds clear to auscultation       Cardiovascular Exercise Tolerance: Good hypertension, (-) angina(-) Past MI and (-) Cardiac Stents Normal cardiovascular exam(-) dysrhythmias (-) Valvular Problems/Murmurs Rhythm:regular Rate:Normal     Neuro/Psych PSYCHIATRIC DISORDERS Anxiety Depression negative neurological ROS     GI/Hepatic negative GI ROS, Neg liver ROS,   Endo/Other  negative endocrine ROS  Renal/GU negative Renal ROS  negative genitourinary   Musculoskeletal   Abdominal   Peds  Hematology negative hematology ROS (+)   Anesthesia Other Findings Past Medical History: No date: Allergy No date: Anxiety No date: Arthritis No date: Foot deformity     Comment:  foot arthrodesis L foot No date: Hyperlipidemia No date: Osteoporosis No date: Shoulder bursitis     Comment:  both  No date: Tendinitis     Comment:  elbow   Reproductive/Obstetrics negative OB ROS                             Anesthesia Physical  Anesthesia Plan  ASA: II  Anesthesia Plan: General   Post-op Pain Management:    Induction: Intravenous  PONV Risk Score and Plan: 2 and Ondansetron, Dexamethasone, Midazolam and Treatment may vary due to age or medical condition  Airway Management Planned: LMA and Oral ETT  Additional  Equipment:   Intra-op Plan:   Post-operative Plan: Extubation in OR  Informed Consent: I have reviewed the patients History and Physical, chart, labs and discussed the procedure including the risks, benefits and alternatives for the proposed anesthesia with the patient or authorized representative who has indicated his/her understanding and acceptance.     Dental Advisory Given  Plan Discussed with: Anesthesiologist, CRNA and Surgeon  Anesthesia Plan Comments:         Anesthesia Quick Evaluation  

## 2020-05-04 NOTE — H&P (Signed)
Chief Complaint: Chief Complaint  Patient presents with  . Hospital Follow Up  Rt Quad tear, pre-op exam   Jorge Jordan is a 60 y.o. male who presents today for evaluation of right quadricep tendon rupture. Patient was seen yesterday 05/03/2020 immediately following an injury yesterday after slipping on ice. He fell and landed on his right knee. No other injury to his body. X-rays of the knee were negative the patient unable to actively straight leg raise the knee, ambulate. He has a palpable defect along the distal quads tendon. Denies any groin or thigh pain. No numbness or tingling throughout the right lower extremity. No swelling throughout the calf or pain within the ankle. He was given 1 Percocet last night which helped alleviate some of the pain. He was placed into a knee immobilizer and given crutches.  Past Medical History: Past Medical History:  Diagnosis Date  . Hyperlipidemia  . Hypertension   Past Surgical History: Past Surgical History:  Procedure Laterality Date  . ankle surgery Left 2000  . EXCISION GANGLION CYST WRIST PRIMARY Right 1990s  . nasal surgery 1981  for deviated septum   Past Family History: Family History  Problem Relation Age of Onset  . Coronary Artery Disease (Blocked arteries around heart) Mother 67  Stent  . Skin cancer Father  . High blood pressure (Hypertension) Brother   Medications: Current Outpatient Medications Ordered in Epic  Medication Sig Dispense Refill  . amLODIPine (NORVASC) 5 MG tablet Take 5 mg by mouth once daily  . aspirin 81 MG EC tablet Take 81 mg by mouth once daily  . busPIRone (BUSPAR) 10 MG tablet Take 10 mg by mouth 3 (three) times daily  . lisinopriL-hydrochlorothiazide (ZESTORETIC) 20-25 mg tablet Take 1 tablet by mouth once daily  . LORazepam (ATIVAN) 2 MG tablet as needed  . pravastatin (PRAVACHOL) 40 MG tablet Take 40 mg by mouth once daily  . sildenafil (REVATIO) 20 mg tablet Take 3-5 tablets by mouth as needed   . sulfamethoxazole-trimethoprim (BACTRIM DS) 800-160 mg tablet Take 1 tablet (160 mg of trimethoprim total) by mouth 2 (two) times daily. 20 tablet 0  . traZODone (DESYREL) 100 MG tablet Take 1 tablet by mouth nightly  . vortioxetine (TRINTELLIX) 20 mg tablet Take 1 tablet by mouth once daily  . lisinopril (PRINIVIL,ZESTRIL) 20 MG tablet Take 1 tablet (20 mg total) by mouth once daily. 90 tablet 3   No current Epic-ordered facility-administered medications on file.   Allergies: No Known Allergies   Review of Systems:  A comprehensive 14 point ROS was performed, reviewed by me today, and the pertinent orthopaedic findings are documented in the HPI.  Exam: BP 122/76  Ht 172.7 cm (5\' 8" )  Wt 89.8 kg (198 lb)  BMI 30.11 kg/m  General:  Well developed, well nourished, no apparent distress, normal affect, presents with crutches and knee immobilizer  HEENT: Head normocephalic, atraumatic, PERRL.   Abdomen: Soft, non tender, non distended, Bowel sounds present.  Heart: Examination of the heart reveals regular, rate, and rhythm. There is no murmur noted on ascultation. There is a normal apical pulse.  Lungs: Lungs are clear to auscultation. There is no wheeze, rhonchi, or crackles. There is normal expansion of bilateral chest walls.   Right lower extremity: Examination of the right lower extremity shows patient is in a well fitted knee immobilizer with the knee held in extension. Knee immobilizer removed, very subtle abrasion to the inferior aspect of the patella with no active  bleeding or signs of infection. Patient is unable to maintain any active extension and has a palpable defect in the distal quads tendon with soft tissue swelling noted. Nontender along the proximal tibial or distal femur. No pain with logrolling in the groin or thigh. No swelling or edema throughout the lower leg and normal ankle plantar flexion dorsiflexion. No tenderness along medial or lateral  malleolus.  EXAM:  RIGHT KNEE - COMPLETE 4+ VIEW   COMPARISON: None.   FINDINGS:  No evidence of fracture, dislocation, or joint effusion. No evidence  of arthropathy or other focal bone abnormality. Soft tissues are  unremarkable.   IMPRESSION:  Negative.   Impression: Quadriceps tendon rupture, right, initial encounter [S76.111A] Quadriceps tendon rupture, right, initial encounter (primary encounter diagnosis)  Plan:  [birth year]. 60 year old male with fall onto the right knee yesterday 05/03/2020. Isolated injury to the anterior knee. X-rays negative. History and exam findings consistent with complete distal quadriceps tendon rupture. Risks, benefits, complications of a right quadriceps tendon repair have been discussed with the patient. Patient has agreed and consented procedure with Dr. Hessie Knows today on 05/04/2020.  This note was generated in part with voice recognition software and I apologize for any typographical errors that were not detected and corrected.  Feliberto Gottron MPA-C    Electronically signed by Feliberto Gottron, PA at 05/04/2020 8:42 AM EST  Back to top of Progress Notes  Ephriam Jenkins, CMA - 05/04/2020 8:45 AM EST Formatting of this note might be different from the original. Review of Systems  Constitutional: Negative.  HENT: Negative.  Eyes: Negative.  Respiratory: Negative.  Cardiovascular: Negative.  Gastrointestinal: Negative.  Endocrine: Negative.  Genitourinary: Negative.  Musculoskeletal: Positive for gait problem and myalgias.  Skin: Negative.  Allergic/Immunologic: Negative.  Neurological: Positive for weakness.  Hematological: Negative.  Psychiatric/Behavioral: Negative.    Electronically signed by Ephriam Jenkins, [place] at 05/04/2020 8:42 AM EST

## 2020-05-05 ENCOUNTER — Encounter: Payer: Self-pay | Admitting: Orthopedic Surgery

## 2020-05-31 DIAGNOSIS — M25661 Stiffness of right knee, not elsewhere classified: Secondary | ICD-10-CM | POA: Diagnosis not present

## 2020-05-31 DIAGNOSIS — M25561 Pain in right knee: Secondary | ICD-10-CM | POA: Diagnosis not present

## 2020-05-31 DIAGNOSIS — R29898 Other symptoms and signs involving the musculoskeletal system: Secondary | ICD-10-CM | POA: Diagnosis not present

## 2020-05-31 DIAGNOSIS — S76112D Strain of left quadriceps muscle, fascia and tendon, subsequent encounter: Secondary | ICD-10-CM | POA: Diagnosis not present

## 2020-06-07 DIAGNOSIS — S76112D Strain of left quadriceps muscle, fascia and tendon, subsequent encounter: Secondary | ICD-10-CM | POA: Diagnosis not present

## 2020-06-14 DIAGNOSIS — S76112D Strain of left quadriceps muscle, fascia and tendon, subsequent encounter: Secondary | ICD-10-CM | POA: Diagnosis not present

## 2020-06-21 DIAGNOSIS — S76112D Strain of left quadriceps muscle, fascia and tendon, subsequent encounter: Secondary | ICD-10-CM | POA: Diagnosis not present

## 2020-06-29 DIAGNOSIS — S76112D Strain of left quadriceps muscle, fascia and tendon, subsequent encounter: Secondary | ICD-10-CM | POA: Diagnosis not present

## 2020-07-12 DIAGNOSIS — M25561 Pain in right knee: Secondary | ICD-10-CM | POA: Diagnosis not present

## 2020-07-12 DIAGNOSIS — S76112D Strain of left quadriceps muscle, fascia and tendon, subsequent encounter: Secondary | ICD-10-CM | POA: Diagnosis not present

## 2020-07-12 DIAGNOSIS — M25661 Stiffness of right knee, not elsewhere classified: Secondary | ICD-10-CM | POA: Diagnosis not present

## 2020-07-12 DIAGNOSIS — R29898 Other symptoms and signs involving the musculoskeletal system: Secondary | ICD-10-CM | POA: Diagnosis not present

## 2020-07-13 ENCOUNTER — Other Ambulatory Visit: Payer: Self-pay | Admitting: Orthopedic Surgery

## 2020-07-13 DIAGNOSIS — M25461 Effusion, right knee: Secondary | ICD-10-CM | POA: Diagnosis not present

## 2020-07-13 DIAGNOSIS — M25561 Pain in right knee: Secondary | ICD-10-CM | POA: Diagnosis not present

## 2020-07-13 DIAGNOSIS — S82021A Displaced longitudinal fracture of right patella, initial encounter for closed fracture: Secondary | ICD-10-CM | POA: Diagnosis not present

## 2020-07-13 DIAGNOSIS — S76111D Strain of right quadriceps muscle, fascia and tendon, subsequent encounter: Secondary | ICD-10-CM | POA: Diagnosis not present

## 2020-07-14 ENCOUNTER — Ambulatory Visit: Payer: Medicare HMO | Admitting: Anesthesiology

## 2020-07-14 ENCOUNTER — Ambulatory Visit
Admission: RE | Admit: 2020-07-14 | Discharge: 2020-07-14 | Disposition: A | Discharge status: Home / Self Care | Payer: Medicare HMO | Attending: Orthopedic Surgery | Admitting: Orthopedic Surgery

## 2020-07-14 ENCOUNTER — Encounter
Admission: RE | Disposition: A | Discharge status: Home / Self Care | Payer: Self-pay | Source: Home / Self Care | Attending: Orthopedic Surgery

## 2020-07-14 ENCOUNTER — Other Ambulatory Visit: Payer: Self-pay

## 2020-07-14 ENCOUNTER — Encounter: Payer: Self-pay | Admitting: Orthopedic Surgery

## 2020-07-14 DIAGNOSIS — Z20822 Contact with and (suspected) exposure to covid-19: Secondary | ICD-10-CM | POA: Insufficient documentation

## 2020-07-14 DIAGNOSIS — Z7982 Long term (current) use of aspirin: Secondary | ICD-10-CM | POA: Diagnosis not present

## 2020-07-14 DIAGNOSIS — S76111D Strain of right quadriceps muscle, fascia and tendon, subsequent encounter: Secondary | ICD-10-CM | POA: Diagnosis not present

## 2020-07-14 DIAGNOSIS — Z79899 Other long term (current) drug therapy: Secondary | ICD-10-CM | POA: Insufficient documentation

## 2020-07-14 DIAGNOSIS — W010XXA Fall on same level from slipping, tripping and stumbling without subsequent striking against object, initial encounter: Secondary | ICD-10-CM | POA: Insufficient documentation

## 2020-07-14 DIAGNOSIS — S76111A Strain of right quadriceps muscle, fascia and tendon, initial encounter: Secondary | ICD-10-CM | POA: Diagnosis not present

## 2020-07-14 DIAGNOSIS — S75111A Minor laceration of femoral vein at hip and thigh level, right leg, initial encounter: Secondary | ICD-10-CM | POA: Diagnosis not present

## 2020-07-14 HISTORY — PX: QUADRICEPS TENDON REPAIR: SHX756

## 2020-07-14 LAB — SARS CORONAVIRUS 2 BY RT PCR (HOSPITAL ORDER, PERFORMED IN ~~LOC~~ HOSPITAL LAB): SARS Coronavirus 2: NEGATIVE

## 2020-07-14 SURGERY — REPAIR, TENDON, QUADRICEPS
Anesthesia: General | Laterality: Right

## 2020-07-14 MED ORDER — DEXAMETHASONE SODIUM PHOSPHATE 10 MG/ML IJ SOLN
INTRAMUSCULAR | Status: DC | PRN
  Administered 2020-07-14: 10 mg via INTRAVENOUS

## 2020-07-14 MED ORDER — PROPOFOL 10 MG/ML IV BOLUS
INTRAVENOUS | Status: AC
  Filled 2020-07-14: qty 20

## 2020-07-14 MED ORDER — CEFAZOLIN SODIUM-DEXTROSE 2-4 GM/100ML-% IV SOLN
2.0000 g | INTRAVENOUS | Status: AC
  Administered 2020-07-14: 2 g via INTRAVENOUS

## 2020-07-14 MED ORDER — FENTANYL CITRATE (PF) 100 MCG/2ML IJ SOLN
INTRAMUSCULAR | Status: AC
  Administered 2020-07-14: 25 ug via INTRAVENOUS
  Filled 2020-07-14: qty 2

## 2020-07-14 MED ORDER — BUPIVACAINE-EPINEPHRINE (PF) 0.5% -1:200000 IJ SOLN
INTRAMUSCULAR | Status: AC
  Filled 2020-07-14: qty 30

## 2020-07-14 MED ORDER — HYDROCODONE-ACETAMINOPHEN 7.5-325 MG PO TABS
ORAL_TABLET | ORAL | Status: AC
  Administered 2020-07-14: 1 via ORAL
  Filled 2020-07-14: qty 1

## 2020-07-14 MED ORDER — CEFAZOLIN SODIUM-DEXTROSE 2-4 GM/100ML-% IV SOLN
INTRAVENOUS | Status: AC
  Filled 2020-07-14: qty 100

## 2020-07-14 MED ORDER — PROPOFOL 10 MG/ML IV BOLUS
INTRAVENOUS | Status: DC | PRN
  Administered 2020-07-14: 200 mg via INTRAVENOUS

## 2020-07-14 MED ORDER — ONDANSETRON HCL 4 MG/2ML IJ SOLN
INTRAMUSCULAR | Status: DC | PRN
  Administered 2020-07-14: 4 mg via INTRAVENOUS

## 2020-07-14 MED ORDER — LACTATED RINGERS IV SOLN: INTRAVENOUS | Status: DC | PRN

## 2020-07-14 MED ORDER — HYDROCODONE-ACETAMINOPHEN 7.5-325 MG PO TABS
1.0000 | ORAL_TABLET | Freq: Once | ORAL | Status: AC
Start: 2020-07-14 — End: 2020-07-14
  Filled 2020-07-14: qty 1

## 2020-07-14 MED ORDER — MIDAZOLAM HCL 2 MG/2ML IJ SOLN
INTRAMUSCULAR | Status: AC
  Filled 2020-07-14: qty 2

## 2020-07-14 MED ORDER — MIDAZOLAM HCL 2 MG/2ML IJ SOLN
INTRAMUSCULAR | Status: DC | PRN
  Administered 2020-07-14: 2 mg via INTRAVENOUS

## 2020-07-14 MED ORDER — BUPIVACAINE-EPINEPHRINE (PF) 0.5% -1:200000 IJ SOLN
INTRAMUSCULAR | Status: DC | PRN
  Administered 2020-07-14: 20 mL

## 2020-07-14 MED ORDER — FENTANYL CITRATE (PF) 100 MCG/2ML IJ SOLN
25.0000 ug | INTRAMUSCULAR | Status: DC | PRN
  Administered 2020-07-14: 25 ug via INTRAVENOUS

## 2020-07-14 MED ORDER — FENTANYL CITRATE (PF) 100 MCG/2ML IJ SOLN
INTRAMUSCULAR | Status: AC
  Filled 2020-07-14: qty 2

## 2020-07-14 MED ORDER — HYDROCODONE-ACETAMINOPHEN 7.5-325 MG PO TABS: 1.0000 | ORAL_TABLET | ORAL | 0 refills | Status: DC | PRN

## 2020-07-14 MED ORDER — PROMETHAZINE HCL 25 MG/ML IJ SOLN: 6.2500 mg | INTRAMUSCULAR | Status: DC | PRN

## 2020-07-14 MED ORDER — FENTANYL CITRATE (PF) 100 MCG/2ML IJ SOLN
INTRAMUSCULAR | Status: DC | PRN
  Administered 2020-07-14: 25 ug via INTRAVENOUS
  Administered 2020-07-14 (×2): 50 ug via INTRAVENOUS
  Administered 2020-07-14 (×3): 25 ug via INTRAVENOUS

## 2020-07-14 MED ORDER — CHLORHEXIDINE GLUCONATE 0.12 % MT SOLN
OROMUCOSAL | Status: AC
  Administered 2020-07-14: 15 mL
  Filled 2020-07-14: qty 15

## 2020-07-14 SURGICAL SUPPLY — 27 items
APL PRP STRL LF DISP 70% ISPRP (MISCELLANEOUS) ×1
CANISTER SUCT 1200ML W/VALVE (MISCELLANEOUS) ×2 IMPLANT
CHLORAPREP W/TINT 26 (MISCELLANEOUS) ×2 IMPLANT
COVER WAND RF STERILE (DRAPES) ×2 IMPLANT
CUFF TOURN SGL QUICK 24 (TOURNIQUET CUFF)
CUFF TOURN SGL QUICK 30 (TOURNIQUET CUFF)
CUFF TRNQT CYL 24X4X16.5-23 (TOURNIQUET CUFF) IMPLANT
CUFF TRNQT CYL 30X4X21-28X (TOURNIQUET CUFF) IMPLANT
ELECT REM PT RETURN 9FT ADLT (ELECTROSURGICAL) ×2
ELECTRODE REM PT RTRN 9FT ADLT (ELECTROSURGICAL) ×1 IMPLANT
GAUZE SPONGE 4X4 12PLY STRL (GAUZE/BANDAGES/DRESSINGS) ×2 IMPLANT
GAUZE XEROFORM 1X8 LF (GAUZE/BANDAGES/DRESSINGS) ×4 IMPLANT
GLOVE SURG SYN 9.0  PF PI (GLOVE) ×1
GLOVE SURG SYN 9.0 PF PI (GLOVE) ×1 IMPLANT
GOWN SRG 2XL LVL 4 RGLN SLV (GOWNS) ×1 IMPLANT
GOWN STRL NON-REIN 2XL LVL4 (GOWNS) ×2
GOWN STRL REUS W/ TWL LRG LVL3 (GOWN DISPOSABLE) ×1 IMPLANT
GOWN STRL REUS W/TWL LRG LVL3 (GOWN DISPOSABLE) ×2
KIT TURNOVER KIT A (KITS) ×2 IMPLANT
MANIFOLD NEPTUNE II (INSTRUMENTS) ×2 IMPLANT
NS IRRIG 1000ML POUR BTL (IV SOLUTION) ×2 IMPLANT
PACK TOTAL KNEE (MISCELLANEOUS) ×2 IMPLANT
PAD ABD DERMACEA PRESS 5X9 (GAUZE/BANDAGES/DRESSINGS) ×4 IMPLANT
PASSER SUT SWANSON 36MM LOOP (INSTRUMENTS) ×2 IMPLANT
RETRIEVER SUT HEWSON (MISCELLANEOUS) ×2 IMPLANT
SCALPEL PROTECTED #10 DISP (BLADE) ×4 IMPLANT
STAPLER SKIN PROX 35W (STAPLE) ×2 IMPLANT

## 2020-07-14 NOTE — Anesthesia Postprocedure Evaluation (Signed)
Anesthesia Post Note  Patient: [name]  Procedure(s) Performed: REPAIR QUADRICEP TENDON (Right )  Patient location during evaluation: PACU Anesthesia Type: General Level of consciousness: awake and alert Pain management: pain level controlled Vital Signs Assessment: post-procedure vital signs reviewed and stable Respiratory status: spontaneous breathing, nonlabored ventilation, respiratory function stable and patient connected to nasal cannula oxygen Cardiovascular status: blood pressure returned to baseline and stable Postop Assessment: no apparent nausea or vomiting Anesthetic complications: no   No complications documented.   Last Vitals:  Vitals:   07/14/20 1435 07/14/20 1452  BP:  128/84  Pulse:  (!) 106  Resp:  18  Temp: 36.8 C 37.1 C  SpO2:  99%    Last Pain:  Vitals:   07/14/20 1452  TempSrc: Temporal  PainSc: 3                  Martha Clan

## 2020-07-14 NOTE — OR Nursing (Signed)
IVF not indicated in epic flowsheet for I/O - no IV fluids given in postop.

## 2020-07-14 NOTE — Transfer of Care (Signed)
Immediate Anesthesia Transfer of Care Note  Patient: Jorge Jordan  Procedure(s) Performed: REPAIR QUADRICEP TENDON (Right )  Patient Location: PACU  Anesthesia Type:General  Level of Consciousness: awake, alert  and oriented  Airway & Oxygen Therapy: Patient Spontanous Breathing  Post-op Assessment: Report given to RN  Post vital signs: Reviewed and stable  Last Vitals:  Vitals Value Taken Time  BP 122/62 07/14/20 1357  Temp 36 C 07/14/20 1357  Pulse 114 07/14/20 1357  Resp 15 07/14/20 1357  SpO2 98 % 07/14/20 1357    Last Pain:  Vitals:   07/14/20 1103  TempSrc: Temporal  PainSc: 0-No pain      Patients Stated Pain Goal: 0 (36/68/15 9470)  Complications: No complications documented.

## 2020-07-14 NOTE — Discharge Instructions (Addendum)
Keep leg elevated is much as you can through the weekend. Try to keep most of your weight off the leg with the knee held completely straight to help it heal. Pain medicine as directed. Call office if you are having problems. Aspirin 325 mg daily to help prevent blood clots.   AMBULATORY SURGERY  DISCHARGE INSTRUCTIONS   1) The drugs that you were given will stay in your system until tomorrow so for the next 24 hours you should not:  A) Drive an automobile B) Make any legal decisions C) Drink any alcoholic beverage   2) You may resume regular meals tomorrow.  Today it is better to start with liquids and gradually work up to solid foods.  You may eat anything you prefer, but it is better to start with liquids, then soup and crackers, and gradually work up to solid foods.   3) Please notify your doctor immediately if you have any unusual bleeding, trouble breathing, redness and pain at the surgery site, drainage, fever, or pain not relieved by medication.    4) Additional Instructions:        Please contact your physician with any problems or Same Day Surgery at [phone], Monday through Friday 6 am to 4 pm, or [place] at [facility] number at [phone].

## 2020-07-14 NOTE — Anesthesia Procedure Notes (Signed)
Procedure Name: LMA Insertion Date/Time: 07/14/2020 12:30 PM Performed by: Lesle Reek, CRNA Pre-anesthesia Checklist: Timeout performed, Patient being monitored, Suction available, Emergency Drugs available and Patient identified Patient Re-evaluated:Patient Re-evaluated prior to induction Oxygen Delivery Method: Circle system utilized Preoxygenation: Pre-oxygenation with 100% oxygen Induction Type: IV induction LMA: LMA inserted LMA Size: 5.0 Number of attempts: 1 Tube secured with: Tape

## 2020-07-14 NOTE — Op Note (Signed)
07/14/2020  2:00 PM  PATIENT:  Jorge Jordan  60 y.o. male  PRE-OPERATIVE DIAGNOSIS:  Rupture of right quadriceps tendon, subsequent encounter S76.111D Quadriceps tendon rupture, right, initial encounter S76.111A  POST-OPERATIVE DIAGNOSIS:  Rupture of right quadriceps tendon, subsequent encounter   PROCEDURE:  Procedure(s): REPAIR QUADRICEP TENDON (Right)  SURGEON: Laurene Footman, MD  ASSISTANTS: none  ANESTHESIA:   general  EBL:  Total I/O In: 2000 [I.V.:2000] Out: -   BLOOD ADMINISTERED:none  DRAINS: none   LOCAL MEDICATIONS USED:  MARCAINE     SPECIMEN:  No Specimen  DISPOSITION OF SPECIMEN:  N/A  COUNTS:  YES  TOURNIQUET: 35 minutes at 300 mmHg  IMPLANTS: Sutures  DICTATION: .Dragon Dictation patient was brought to the operating room and after adequate general anesthesia was obtained the right leg was prepped and draped in the usual sterile fashion with a tourniquet to the upper thigh.  After patient identification and timeout procedure were completed prior incision was opened right had prior quadriceps repair and hematoma was evacuated from the knee.  There was rupture of the tendon with slight retraction.  The prior sutures were ruptured.  The and were removed.  The wound was thoroughly irrigated and there were some small bone fragments including the proximal pole with some articular cartilage which was removed.  Following this the patella was adequately exposed with a 4 drill holes pubic could be made and with a suture passer sutures that have been woven through the quad tendon were passed through the patella with #2 FiberWire being utilized.  The sutures were then tied over the distal patella with the knee in extension and the quadriceps tendon touching the raw patellar surface.  After the sutures have been tied the knee could be flexed to 98 without disruption.  #1 Vicryl was then used to repair the capsule medial lateral and cover the anterior portion of the repair.   The wound was then closed with 2-0 Vicryl subcutaneously and skin staples.  A distal incision that had had some drainage was opened and was debrided to remove a loose suture from the prior repair that might have been infected.  This wound was also closed with staples.  The wound was infiltrated with 20 cc half percent Sensorcaine prior to wound closure to aid in postop analgesia.  Dressings of Xeroform 4 x 4 web roll and Ace wrap applied followed by the knee immobilizer locked in extension.  PLAN OF CARE: Discharge to home after PACU  PATIENT DISPOSITION:  PACU - hemodynamically stable.

## 2020-07-14 NOTE — H&P (Signed)
Chief Complaint  Patient presents with  . Knee Pain  Right knee after a fall last night, post right quad tendon repair date of surgery 05/04/2020    History of the Present Illness: Jorge Jordan is a 60 y.o. male here today for evaluation of right knee pain. Last night he suffered a mechanical fall after tripping and fell injuring his right knee. He is uncertain how he landed but did feel a tearing sensation in the knee. He has had a lot of pain and swelling increasing throughout the day. He has been taken Tylenol with no relief. Denies any numbness or tingling throughout the lower leg. Patient underwent a right quad tendon repair date of surgery 05/04/2020. Patient was doing well following his previous quad tendon repair on 05/04/2020. He was ambulating with no assistive devices. He is currently back in his knee immobilizer because he is unable to maintain extension and he is using crutches.  I have reviewed past medical, surgical, social and family history, and allergies as documented in the EMR.  Past Medical History: Past Medical History:  Diagnosis Date  . Hyperlipidemia  . Hypertension   Past Surgical History: Past Surgical History:  Procedure Laterality Date  . ankle surgery Left 2000  . EXCISION GANGLION CYST WRIST PRIMARY Right 1990s  . nasal surgery 1981  for deviated septum  . Quadriceps tendon repair Right 05/04/2020  Rudene Christians   Past Family History: Family History  Problem Relation Age of Onset  . Coronary Artery Disease (Blocked arteries around heart) Mother 71  Stent  . Skin cancer Father  . High blood pressure (Hypertension) Brother   Medications: Current Outpatient Medications Ordered in Epic  Medication Sig Dispense Refill  . amLODIPine (NORVASC) 5 MG tablet Take 5 mg by mouth once daily  . aspirin 81 MG EC tablet Take 81 mg by mouth once daily  . busPIRone (BUSPAR) 10 MG tablet Take 10 mg by mouth 3 (three) times daily  . cephalexin (KEFLEX) 500 MG capsule Take  1 capsule (500 mg total) by mouth 4 (four) times daily for 7 days 28 capsule 0  . HYDROcodone-acetaminophen (NORCO) 5-325 mg tablet Take 1 tablet by mouth every 6 (six) hours as needed (Patient not taking: Reported on 07/10/2020 ) 20 tablet 0  . HYDROcodone-acetaminophen (NORCO) 5-325 mg tablet Take 1 tablet by mouth every 6 (six) hours as needed (Patient not taking: Reported on 07/10/2020 ) 20 tablet 0  . HYDROcodone-acetaminophen (NORCO) 7.5-325 mg tablet Take 1-2 tablets by mouth every 4 (four) hours as needed (Patient not taking: Reported on 07/10/2020 )  . lisinopril (PRINIVIL,ZESTRIL) 20 MG tablet Take 1 tablet (20 mg total) by mouth once daily. 90 tablet 3  . lisinopriL-hydrochlorothiazide (ZESTORETIC) 20-25 mg tablet Take 1 tablet by mouth once daily  . LORazepam (ATIVAN) 2 MG tablet as needed  . oxyCODONE (ROXICODONE) 5 MG immediate release tablet Take 1 tablet (5 mg total) by mouth every 4 (four) hours as needed for Pain 30 tablet 0  . pravastatin (PRAVACHOL) 40 MG tablet Take 40 mg by mouth once daily  . predniSONE (DELTASONE) 10 MG tablet Take 1 tablet (10 mg total) by mouth once daily 6 day taper, 6,5,4,3,2,1. (Patient not taking: Reported on 07/13/2020 ) 21 tablet 0  . sildenafil (REVATIO) 20 mg tablet Take 3-5 tablets by mouth as needed  . sulfamethoxazole-trimethoprim (BACTRIM DS) 800-160 mg tablet Take 1 tablet (160 mg of trimethoprim total) by mouth 2 (two) times daily. (Patient not taking: Reported on  07/10/2020 ) 20 tablet 0  . traZODone (DESYREL) 100 MG tablet Take 1 tablet by mouth nightly  . vortioxetine (TRINTELLIX) 20 mg tablet Take 1 tablet by mouth once daily   No current Epic-ordered facility-administered medications on file.   Allergies: No Known Allergies   There is no height or weight on file to calculate BMI.  Review of Systems: A comprehensive 14 point ROS was performed, reviewed, and the pertinent orthopaedic findings are documented in the HPI.  Vitals:  07/13/20  1507  BP: 124/84    General Physical Examination:   General:  Well developed, well nourished, no apparent distress, normal affect, ambulates with crutches and knee immobilizer  HEENT: Head normocephalic, atraumatic, PERRL.   Abdomen: Soft, non tender, non distended, Bowel sounds present.  Heart: Examination of the heart reveals regular, rate, and rhythm. There is no murmur noted on ascultation. There is a normal apical pulse.  Lungs: Lungs are clear to auscultation. There is no wheeze, rhonchi, or crackles. There is normal expansion of bilateral chest walls.   Musculoskeletal Examination:  Examination of the right lower extremity shows no warmth erythema or drainage. Incision sites completely healed. Patient has significant swelling along the suprapatellar region with fluctuance. Patient is unable to maintain knee extension. He has severe pain with attempted knee extension. No patellar tendon defect. Nontender along the medial or lateral joint line.  Radiographs:  AP lateral sunrise views of the right knee are ordered interpreted by me in the office today. Impression: Patient has calcification along the distal quadriceps tendon with what appears to be an acute displaced avulsion off the proximal patella pole. Significant suprapatellar soft tissue swelling noted. Mild effusion noted. Adequate joint space in the medial and lateral compartments. No other evidence of acute bony abnormality or abnormal bony lesions  Assessment: ICD-10-CM  1. Fall, initial encounter W19.XXXA  2. Closed displaced longitudinal fracture of right patella, initial encounter S82.021A  3. Rupture of right quadriceps tendon, initial encounter S76.111A  4. Status post right quad tendon repair date of surgery 05/04/2020 by Dr. Hessie Knows [id number]   Plan:  9. 60 year old male with fall yesterday 07/12/2020. He presents today for evaluation. He is unable to maintain active extension of the knee. He underwent quad  tendon repair on the right knee 05/04/2020 and was doing well and making good recovery up until his fall yesterday. Today patient has history, exam, ultrasound and x-ray findings consistent with quad tendon tear with patellar avulsion. Risks, benefits, complications of a right knee quadricep tendon repair have been discussed with patient. Patient has agreed and consented procedure with Dr. Hessie Knows tomorrow 07/14/2020   Electronically signed by Feliberto Gottron, PA at 07/13/2020 5:25 PM EDT  Back to top of Progress Notes  Shigeru, [place], [place] - 07/13/2020 3:00 PM EDT Formatting of this note might be different from the original. Review of Systems  Musculoskeletal: Positive for arthralgias.  All other systems reviewed and are negative.   Electronically signed by Bryson Dames, [place] at 07/13/2020 5:15 PM EDT  Back to top of Progress Notes   Reviewed  H+P. No changes noted.

## 2020-07-14 NOTE — Anesthesia Preprocedure Evaluation (Signed)
Anesthesia Evaluation  Patient identified by MRN, date of birth, ID band Patient awake    Reviewed: Allergy & Precautions, H&P , NPO status , Patient's Chart, lab work & pertinent test results, reviewed documented beta blocker date and time   History of Anesthesia Complications Negative for: history of anesthetic complications  Airway Mallampati: II  TM Distance: >3 FB Neck ROM: full    Dental  (+) Dental Advidsory Given, Caps, Missing, Teeth Intact   Pulmonary neg shortness of breath, asthma (allergen induced) , neg sleep apnea, neg recent URI,    Pulmonary exam normal breath sounds clear to auscultation       Cardiovascular Exercise Tolerance: Good hypertension, (-) angina(-) Past MI and (-) Cardiac Stents Normal cardiovascular exam(-) dysrhythmias (-) Valvular Problems/Murmurs Rhythm:regular Rate:Normal     Neuro/Psych PSYCHIATRIC DISORDERS Anxiety Depression negative neurological ROS     GI/Hepatic negative GI ROS, Neg liver ROS,   Endo/Other  negative endocrine ROS  Renal/GU negative Renal ROS  negative genitourinary   Musculoskeletal   Abdominal   Peds  Hematology negative hematology ROS (+)   Anesthesia Other Findings Past Medical History: No date: Allergy No date: Anxiety No date: Arthritis No date: Foot deformity     Comment:  foot arthrodesis L foot No date: Hyperlipidemia No date: Osteoporosis No date: Shoulder bursitis     Comment:  both  No date: Tendinitis     Comment:  elbow   Reproductive/Obstetrics negative OB ROS                             Anesthesia Physical  Anesthesia Plan  ASA: II  Anesthesia Plan: General   Post-op Pain Management:    Induction: Intravenous  PONV Risk Score and Plan: 2 and Ondansetron, Dexamethasone, Midazolam and Treatment may vary due to age or medical condition  Airway Management Planned: LMA and Oral ETT  Additional  Equipment:   Intra-op Plan:   Post-operative Plan: Extubation in OR  Informed Consent: I have reviewed the patients History and Physical, chart, labs and discussed the procedure including the risks, benefits and alternatives for the proposed anesthesia with the patient or authorized representative who has indicated his/her understanding and acceptance.     Dental Advisory Given  Plan Discussed with: Anesthesiologist, CRNA and Surgeon  Anesthesia Plan Comments:         Anesthesia Quick Evaluation

## 2020-07-15 ENCOUNTER — Encounter: Payer: Self-pay | Admitting: Orthopedic Surgery

## 2020-07-24 ENCOUNTER — Ambulatory Visit: Payer: Medicare HMO | Admitting: Family Medicine

## 2020-09-05 ENCOUNTER — Ambulatory Visit (INDEPENDENT_AMBULATORY_CARE_PROVIDER_SITE_OTHER): Payer: Medicare HMO

## 2020-09-05 VITALS — Ht 68.0 in | Wt 200.0 lb

## 2020-09-05 DIAGNOSIS — Z0000 Encounter for general adult medical examination without abnormal findings: Secondary | ICD-10-CM | POA: Diagnosis not present

## 2020-09-05 NOTE — Patient Instructions (Signed)
Jorge Jordan , Thank you for taking time to come for your Medicare Wellness Visit. I appreciate your ongoing commitment to your health goals. Please review the following plan we discussed and let me know if I can assist you in the future.   Screening recommendations/referrals: Colonoscopy: cologuard 02/17/2018, due 02/17/2021 Recommended yearly ophthalmology/optometry visit for glaucoma screening and checkup Recommended yearly dental visit for hygiene and checkup  Vaccinations: Influenza vaccine: due 11/06/2020 Pneumococcal vaccine: n/a Tdap vaccine: completed 10/19/2012, due 10/20/2022 Shingles vaccine: completed   Covid-19:  08/25/2020, 07/22/2019, 07/01/2019  Advanced directives: Please bring a copy of your POA (Power of Attorney) and/or Living Will to your next appointment.   Conditions/risks identified: none  Next appointment: Follow up in one year for your annual wellness visit   Preventive Care 40-64 Years, Male Preventive care refers to lifestyle choices and visits with your health care provider that can promote health and wellness. What does preventive care include?  A yearly physical exam. This is also called an annual well check.  Dental exams once or twice a year.  Routine eye exams. Ask your health care provider how often you should have your eyes checked.  Personal lifestyle choices, including:  Daily care of your teeth and gums.  Regular physical activity.  Eating a healthy diet.  Avoiding tobacco and drug use.  Limiting alcohol use.  Practicing safe sex.  Taking low-dose aspirin every day starting at age 66. What happens during an annual well check? The services and screenings done by your health care provider during your annual well check will depend on your age, overall health, lifestyle risk factors, and family history of disease. Counseling  Your health care provider may ask you questions about your:  Alcohol use.  Tobacco use.  Drug use.  Emotional  well-being.  Home and relationship well-being.  Sexual activity.  Eating habits.  Work and work Statistician. Screening  You may have the following tests or measurements:  Height, weight, and BMI.  Blood pressure.  Lipid and cholesterol levels. These may be checked every 5 years, or more frequently if you are over 54 years old.  Skin check.  Lung cancer screening. You may have this screening every year starting at age 25 if you have a 30-pack-year history of smoking and currently smoke or have quit within the past 15 years.  Fecal occult blood test (FOBT) of the stool. You may have this test every year starting at age 34.  Flexible sigmoidoscopy or colonoscopy. You may have a sigmoidoscopy every 5 years or a colonoscopy every 10 years starting at age 23.  Prostate cancer screening. Recommendations will vary depending on your family history and other risks.  Hepatitis C blood test.  Hepatitis B blood test.  Sexually transmitted disease (STD) testing.  Diabetes screening. This is done by checking your blood sugar (glucose) after you have not eaten for a while (fasting). You may have this done every 1-3 years. Discuss your test results, treatment options, and if necessary, the need for more tests with your health care provider. Vaccines  Your health care provider may recommend certain vaccines, such as:  Influenza vaccine. This is recommended every year.  Tetanus, diphtheria, and acellular pertussis (Tdap, Td) vaccine. You may need a Td booster every 10 years.  Zoster vaccine. You may need this after age 41.  Pneumococcal 13-valent conjugate (PCV13) vaccine. You may need this if you have certain conditions and have not been vaccinated.  Pneumococcal polysaccharide (PPSV23) vaccine. You may need  one or two doses if you smoke cigarettes or if you have certain conditions. Talk to your health care provider about which screenings and vaccines you need and how often you need  them. This information is not intended to replace advice given to you by your health care provider. Make sure you discuss any questions you have with your health care provider. Document Released: 04/21/2015 Document Revised: 12/13/2015 Document Reviewed: 01/24/2015 Elsevier Interactive Patient Education  2017 [employer] Prevention in the Home Falls can cause injuries. They can happen to people of all ages. There are many things you can do to make your home safe and to help prevent falls. What can I do on the outside of my home?  Regularly fix the edges of walkways and driveways and fix any cracks.  Remove anything that might make you trip as you walk through a door, such as a raised step or threshold.  Trim any bushes or trees on the path to your home.  Use bright outdoor lighting.  Clear any walking paths of anything that might make someone trip, such as rocks or tools.  Regularly check to see if handrails are loose or broken. Make sure that both sides of any steps have handrails.  Any raised decks and porches should have guardrails on the edges.  Have any leaves, snow, or ice cleared regularly.  Use sand or salt on walking paths during winter.  Clean up any spills in your garage right away. This includes oil or grease spills. What can I do in the bathroom?  Use night lights.  Install grab bars by the toilet and in the tub and shower. Do not use towel bars as grab bars.  Use non-skid mats or decals in the tub or shower.  If you need to sit down in the shower, use a plastic, non-slip stool.  Keep the floor dry. Clean up any water that spills on the floor as soon as it happens.  Remove soap buildup in the tub or shower regularly.  Attach bath mats securely with double-sided non-slip rug tape.  Do not have throw rugs and other things on the floor that can make you trip. What can I do in the bedroom?  Use night lights.  Make sure that you have a light by your  bed that is easy to reach.  Do not use any sheets or blankets that are too big for your bed. They should not hang down onto the floor.  Have a firm chair that has side arms. You can use this for support while you get dressed.  Do not have throw rugs and other things on the floor that can make you trip. What can I do in the kitchen?  Clean up any spills right away.  Avoid walking on wet floors.  Keep items that you use a lot in easy-to-reach places.  If you need to reach something above you, use a strong step stool that has a grab bar.  Keep electrical cords out of the way.  Do not use floor polish or wax that makes floors slippery. If you must use wax, use non-skid floor wax.  Do not have throw rugs and other things on the floor that can make you trip. What can I do with my stairs?  Do not leave any items on the stairs.  Make sure that there are handrails on both sides of the stairs and use them. Fix handrails that are broken or loose. Make sure that  handrails are as long as the stairways.  Check any carpeting to make sure that it is firmly attached to the stairs. Fix any carpet that is loose or worn.  Avoid having throw rugs at the top or bottom of the stairs. If you do have throw rugs, attach them to the floor with carpet tape.  Make sure that you have a light switch at the top of the stairs and the bottom of the stairs. If you do not have them, ask someone to add them for you. What else can I do to help prevent falls?  Wear shoes that:  Do not have high heels.  Have rubber bottoms.  Are comfortable and fit you well.  Are closed at the toe. Do not wear sandals.  If you use a stepladder:  Make sure that it is fully opened. Do not climb a closed stepladder.  Make sure that both sides of the stepladder are locked into place.  Ask someone to hold it for you, if possible.  Clearly mark and make sure that you can see:  Any grab bars or handrails.  First and last  steps.  Where the edge of each step is.  Use tools that help you move around (mobility aids) if they are needed. These include:  Canes.  Walkers.  Scooters.  Crutches.  Turn on the lights when you go into a dark area. Replace any light bulbs as soon as they burn out.  Set up your furniture so you have a clear path. Avoid moving your furniture around.  If any of your floors are uneven, fix them.  If there are any pets around you, be aware of where they are.  Review your medicines with your doctor. Some medicines can make you feel dizzy. This can increase your chance of falling. Ask your doctor what other things that you can do to help prevent falls. This information is not intended to replace advice given to you by your health care provider. Make sure you discuss any questions you have with your health care provider. Document Released: 01/19/2009 Document Revised: 08/31/2015 Document Reviewed: 04/29/2014 Elsevier Interactive Patient Education  2017 [employer].

## 2020-09-05 NOTE — Progress Notes (Signed)
I connected with [name] today by telephone and verified that I am speaking with the correct person using two identifiers. Location patient: home Location provider: work Persons participating in the virtual visit: [name], Glenna Durand LPN.   I discussed the limitations, risks, security and privacy concerns of performing an evaluation and management service by telephone and the availability of in person appointments. I also discussed with the patient that there may be a patient responsible charge related to this service. The patient expressed understanding and verbally consented to this telephonic visit.    Interactive audio and video telecommunications were attempted between this provider and patient, however failed, due to patient having technical difficulties OR patient did not have access to video capability.  We continued and completed visit with audio only.     Vital signs may be patient reported or missing.  Subjective:   Jorge Jordan is a 60 y.o. male who presents for Medicare Annual/Subsequent preventive examination.  Review of Systems     Cardiac Risk Factors include: advanced age (>41men, >46 women);dyslipidemia;hypertension;male gender;obesity (BMI >30kg/m2);sedentary lifestyle     Objective:    Today's Vitals   09/05/20 1356  Weight: 200 lb (90.7 kg)  Height: 5\' 8"  (1.727 m)   Body mass index is 30.41 kg/m.  Advanced Directives 09/05/2020 07/14/2020 05/04/2020 05/03/2020 08/31/2019 10/12/2015  Does Patient Have a Medical Advance Directive? Yes No No No No No  Type of Paramedic of [place];Living will - - - - -  Copy of [place] in Chart? No - copy requested - - - - -  Would patient like information on creating a medical advance directive? - No - Patient declined No - Patient declined - - Yes - Educational materials given    Current Medications (verified) Outpatient Encounter Medications as of 09/05/2020   Medication Sig  . acetaminophen (TYLENOL) 500 MG tablet Take 1,000 mg by mouth every 6 (six) hours as needed.  Marland Kitchen amLODipine (NORVASC) 5 MG tablet Take 1 tablet (5 mg total) by mouth daily.  Marland Kitchen aspirin EC 81 MG tablet Take 81 mg by mouth daily.  . busPIRone (BUSPAR) 10 MG tablet Take 1 tablet (10 mg total) by mouth 3 (three) times daily.  Marland Kitchen lisinopril-hydrochlorothiazide (ZESTORETIC) 20-25 MG tablet Take 1 tablet by mouth daily.  . pravastatin (PRAVACHOL) 40 MG tablet Take 1 tablet (40 mg total) by mouth daily.  . sildenafil (REVATIO) 20 MG tablet Take 3-5 tablets as needed 30 prior to sex.  . traZODone (DESYREL) 100 MG tablet Take 1 tablet (100 mg total) by mouth at bedtime.  . vortioxetine HBr (TRINTELLIX) 10 MG TABS tablet Take 1 tablet (10 mg total) by mouth daily.  Marland Kitchen HYDROcodone-acetaminophen (NORCO) 7.5-325 MG tablet Take 1-2 tablets by mouth every 4 (four) hours as needed for moderate pain. Maximum 8 per 24 hours (Patient not taking: Reported on 09/05/2020)   No facility-administered encounter medications on file as of 09/05/2020.    Allergies (verified) Patient has no known allergies.   History: Past Medical History:  Diagnosis Date  . Allergy   . Anxiety   . Arthritis   . Foot deformity    foot arthrodesis L foot  . Hyperlipidemia   . Osteoporosis   . Shoulder bursitis    both   . Tendinitis    elbow   Past Surgical History:  Procedure Laterality Date  . FOOT SURGERY Left 2001   Triple arthrodesis  . QUADRICEPS TENDON REPAIR Right  07/14/2020   Procedure: REPAIR QUADRICEP TENDON;  Surgeon: Hessie Knows, MD;  Location: [facility] ORS;  Service: Orthopedics;  Laterality: Right;  . REPAIR QUADRICEPS/HAMSTRING MUSCLES Right 05/04/2020   Procedure: REPAIR QUADRICEPS/HAMSTRING MUSCLES;  Surgeon: Hessie Knows, MD;  Location: [facility] ORS;  Service: Orthopedics;  Laterality: Right;  RIGHT QUADRICEPS  . ROTATOR CUFF REPAIR Left   . SHOULDER FUSION SURGERY    . WRIST SURGERY     ganglion  cyst   Family History  Problem Relation Age of Onset  . Hyperlipidemia Mother   . Hypertension Mother   . Diabetes Mother   . Depression Mother   . Hypertension Father   . Hyperlipidemia Father   . Cancer Father        skin cancer  . Hypertension Brother   . Diabetes Maternal Grandmother   . Heart attack Maternal Grandfather    Social History   Socioeconomic History  . Marital status: Married    Spouse name: Not on file  . Number of children: Not on file  . Years of education: Not on file  . Highest education level: Not on file  Occupational History  . Occupation: disability   Tobacco Use  . Smoking status: Never Smoker  . Smokeless tobacco: Never Used  Vaping Use  . Vaping Use: Never used  Substance and Sexual Activity  . Alcohol use: Not Currently    Alcohol/week: 0.0 standard drinks  . Drug use: No  . Sexual activity: Not on file  Other Topics Concern  . Not on file  Social History Narrative  . Not on file   Social Determinants of Health   Financial Resource Strain: Low Risk   . Difficulty of Paying Living Expenses: Not hard at all  Food Insecurity: No Food Insecurity  . Worried About Charity fundraiser in the Last Year: Never true  . Ran Out of Food in the Last Year: Never true  Transportation Needs: No Transportation Needs  . Lack of Transportation (Medical): No  . Lack of Transportation (Non-Medical): No  Physical Activity: Inactive  . Days of Exercise per Week: 0 days  . Minutes of Exercise per Session: 0 min  Stress: No Stress Concern Present  . Feeling of Stress : Not at all  Social Connections: Not on file    Tobacco Counseling Counseling given: Not Answered   Clinical Intake:  Pre-visit preparation completed: Yes  Pain : No/denies pain     Nutritional Status: BMI > 30  Obese Nutritional Risks: None Diabetes: No  How often do you need to have someone help you when you read instructions, pamphlets, or other written materials from  your doctor or pharmacy?: 1 - Never What is the last grade level you completed in school?: associates degree  Diabetic? no  Interpreter Needed?: No  Information entered by :: NAllen LPN   Activities of Daily Living In your present state of health, do you have any difficulty performing the following activities: 09/05/2020  Hearing? Y  Comment slight hearing loss  Vision? N  Difficulty concentrating or making decisions? N  Walking or climbing stairs? Y  Dressing or bathing? N  Doing errands, shopping? N  Preparing Food and eating ? N  Using the Toilet? N  In the past six months, have you accidently leaked urine? N  Do you have problems with loss of bowel control? N  Managing your Medications? N  Managing your Finances? N  Housekeeping or managing your Housekeeping? N  Some recent data  might be hidden    Patient Care Team: Olin Hauser, DO as PCP - General (Family Medicine) Graylin Shiver, [place] (Rheumatology) Greg Cutter, LCSW as [place] (Licensed Clinical Social Worker)  Indicate any recent [place] you may have received from other than [facility] providers in the past year (date may be approximate).     Assessment:   This is a routine wellness examination for [place].  Hearing/Vision screen  Hearing Screening   125Hz  250Hz  500Hz  1000Hz  2000Hz  3000Hz  4000Hz  6000Hz  8000Hz   Right ear:           Left ear:           Vision Screening Comments: No regular eye exams,   Dietary issues and exercise activities discussed: Current Exercise Habits: The patient does not participate in regular exercise at present  Goals Addressed            This Visit's Progress   . Patient Stated       09/05/2020, wants to lose 10 pounds      Depression Screen PHQ 2/9 Scores 09/05/2020 10/25/2019 08/31/2019 04/26/2019 10/22/2018 01/26/2018 07/21/2017  PHQ - 2 Score 0 2 4 0 0 0 1  PHQ- 9 Score - 4 9 1  0 0 4    Fall Risk Fall Risk   09/05/2020 08/31/2019 04/26/2019 10/22/2018 01/26/2018  Falls in the past year? 1 0 0 0 No  Comment slipped on ice, not sure what happened - - - -  Number falls in past yr: 1 0 0 0 -  Injury with Fall? 1 0 0 - -  Risk for fall due to : Impaired mobility;Medication side effect;Impaired balance/gait;History of fall(s) - - - -  Follow up Falls evaluation completed;Education provided;Falls prevention discussed - Falls evaluation completed - -    FALL RISK PREVENTION PERTAINING TO THE HOME:  Any stairs in or around the home? Yes  If so, are there any without handrails? No  Home free of loose throw rugs in walkways, pet beds, electrical cords, etc? Yes  Adequate lighting in your home to reduce risk of falls? Yes   ASSISTIVE DEVICES UTILIZED TO PREVENT FALLS:  Life alert? No  Use of a cane, walker or w/c? No  Grab bars in the bathroom? No  Shower chair or bench in shower? Yes  Elevated toilet seat or a handicapped toilet? No   TIMED UP AND GO:  Was the test performed? No .    Cognitive Function:     6CIT Screen 09/05/2020  What Year? 0 points  What month? 0 points  What time? 0 points  Count back from 20 0 points  Months in reverse 0 points  Repeat phrase 0 points  Total Score 0    Immunizations Immunization History  Administered Date(s) Administered  . Influenza,inj,Quad PF,6+ Mos 02/05/2017, 01/26/2018  . Influenza-Unspecified 02/05/2017, 02/08/2019  . PFIZER(Purple Top)SARS-COV-2 Vaccination 07/01/2019, 07/22/2019, 08/25/2020  . Tdap 10/19/2012  . Zoster Recombinat (Shingrix) 01/03/2020, 05/19/2020    TDAP status: Up to date  Flu Vaccine status: Up to date  Pneumococcal vaccine status: Up to date  Covid-19 vaccine status: Completed vaccines  Qualifies for Shingles Vaccine? Yes   Zostavax completed No   Shingrix Completed?: Yes  Screening Tests Health Maintenance  Topic Date Due  . HIV Screening  Never done  . INFLUENZA VACCINE  11/06/2020  . Fecal DNA  (Cologuard)  02/17/2021  . TETANUS/TDAP  10/20/2022  . COVID-19 Vaccine  Completed  .  Hepatitis C Screening  Completed  . Zoster Vaccines- Shingrix  Completed  . HPV VACCINES  Aged Out    Health Maintenance  Health Maintenance Due  Topic Date Due  . HIV Screening  Never done    Colorectal cancer screening: Type of screening: Cologuard. Completed 02/17/2018. Repeat every 3 years  Lung Cancer Screening: (Low Dose CT Chest recommended if Age 85-80 years, 30 pack-year currently smoking OR have quit w/in 15years.) does not qualify.   Lung Cancer Screening Referral: no  Additional Screening:  Hepatitis C Screening: does qualify; Completed 06/07/2015  Vision Screening: Recommended annual ophthalmology exams for early detection of glaucoma and other disorders of the eye. Is the patient up to date with their annual eye exam?  No  Who is the provider or what is the name of the office in which the patient attends annual eye exams? none If pt is not established with a provider, would they like to be referred to a provider to establish care? No .   Dental Screening: Recommended annual dental exams for proper oral hygiene  Community Resource Referral / Chronic Care Management: CRR required this visit?  No   CCM required this visit?  No      Plan:     I have personally reviewed and noted the following in the patient's chart:   . Medical and social history . Use of alcohol, tobacco or illicit drugs  . Current medications and supplements including opioid prescriptions. Patient is not currently taking opioid prescriptions. . Functional ability and status . Nutritional status . Physical activity . Advanced directives . List of other physicians . Hospitalizations, surgeries, and ER visits in previous 12 months . Vitals . Screenings to include cognitive, depression, and falls . Referrals and appointments  In addition, I have reviewed and discussed with patient certain preventive  protocols, quality metrics, and best practice recommendations. A written personalized care plan for preventive services as well as general preventive health recommendations were provided to patient.     Kellie Simmering, LPN   7/82/4235   Nurse Notes:

## 2020-11-01 ENCOUNTER — Other Ambulatory Visit: Payer: Self-pay

## 2020-11-01 DIAGNOSIS — R7303 Prediabetes: Secondary | ICD-10-CM

## 2020-11-01 DIAGNOSIS — I10 Essential (primary) hypertension: Secondary | ICD-10-CM

## 2020-11-01 DIAGNOSIS — E782 Mixed hyperlipidemia: Secondary | ICD-10-CM

## 2020-11-01 DIAGNOSIS — Z125 Encounter for screening for malignant neoplasm of prostate: Secondary | ICD-10-CM

## 2020-11-01 DIAGNOSIS — Z0000 Encounter for general adult medical examination without abnormal findings: Secondary | ICD-10-CM

## 2020-11-06 DIAGNOSIS — R7303 Prediabetes: Secondary | ICD-10-CM | POA: Diagnosis not present

## 2020-11-06 DIAGNOSIS — E782 Mixed hyperlipidemia: Secondary | ICD-10-CM | POA: Diagnosis not present

## 2020-11-06 DIAGNOSIS — I10 Essential (primary) hypertension: Secondary | ICD-10-CM | POA: Diagnosis not present

## 2020-11-06 DIAGNOSIS — Z0000 Encounter for general adult medical examination without abnormal findings: Secondary | ICD-10-CM | POA: Diagnosis not present

## 2020-11-06 DIAGNOSIS — Z125 Encounter for screening for malignant neoplasm of prostate: Secondary | ICD-10-CM | POA: Diagnosis not present

## 2020-11-07 LAB — LIPID PANEL
Chol/HDL Ratio: 4.4 ratio (ref 0.0–5.0)
Cholesterol, Total: 207 mg/dL — ABNORMAL HIGH (ref 100–199)
HDL: 47 mg/dL (ref >39)
LDL Chol Calc (NIH): 113 mg/dL — ABNORMAL HIGH (ref 0–99)
Triglycerides: 270 mg/dL — ABNORMAL HIGH (ref 0–149)
VLDL Cholesterol Cal: 47 mg/dL — ABNORMAL HIGH (ref 5–40)

## 2020-11-07 LAB — CBC WITH DIFFERENTIAL/PLATELET
Basophils Absolute: 0.1 10*3/uL (ref 0.0–0.2)
Basos: 1 %
EOS (ABSOLUTE): 0.2 10*3/uL (ref 0.0–0.4)
Eos: 4 %
Hematocrit: 47.3 % (ref 37.5–51.0)
Hemoglobin: 16.9 g/dL (ref 13.0–17.7)
Immature Grans (Abs): 0 10*3/uL (ref 0.0–0.1)
Immature Granulocytes: 0 %
Lymphocytes Absolute: 1.6 10*3/uL (ref 0.7–3.1)
Lymphs: 31 %
MCH: 33.1 pg — ABNORMAL HIGH (ref 26.6–33.0)
MCHC: 35.7 g/dL (ref 31.5–35.7)
MCV: 93 fL (ref 79–97)
Monocytes Absolute: 0.7 10*3/uL (ref 0.1–0.9)
Monocytes: 14 %
Neutrophils Absolute: 2.6 10*3/uL (ref 1.4–7.0)
Neutrophils: 50 %
Platelets: 244 10*3/uL (ref 150–450)
RBC: 5.1 x10E6/uL (ref 4.14–5.80)
RDW: 12.5 % (ref 11.6–15.4)
WBC: 5.1 10*3/uL (ref 3.4–10.8)

## 2020-11-07 LAB — COMPREHENSIVE METABOLIC PANEL
ALT: 30 IU/L (ref 0–44)
AST: 23 IU/L (ref 0–40)
Albumin/Globulin Ratio: 1.6 (ref 1.2–2.2)
Albumin: 4.3 g/dL (ref 3.8–4.9)
Alkaline Phosphatase: 111 IU/L (ref 44–121)
BUN/Creatinine Ratio: 13 (ref 9–20)
BUN: 14 mg/dL (ref 6–24)
Bilirubin Total: 0.6 mg/dL (ref 0.0–1.2)
CO2: 22 mmol/L (ref 20–29)
Calcium: 9.7 mg/dL (ref 8.7–10.2)
Chloride: 98 mmol/L (ref 96–106)
Creatinine, Ser: 1.08 mg/dL (ref 0.76–1.27)
Globulin, Total: 2.7 g/dL (ref 1.5–4.5)
Glucose: 133 mg/dL — ABNORMAL HIGH (ref 65–99)
Potassium: 4.2 mmol/L (ref 3.5–5.2)
Sodium: 137 mmol/L (ref 134–144)
Total Protein: 7 g/dL (ref 6.0–8.5)
eGFR: 79 mL/min/{1.73_m2} (ref >59)

## 2020-11-07 LAB — HEMOGLOBIN A1C
Est. average glucose Bld gHb Est-mCnc: 131 mg/dL
Hgb A1c MFr Bld: 6.2 % — ABNORMAL HIGH (ref 4.8–5.6)

## 2020-11-07 LAB — PSA: Prostate Specific Ag, Serum: 1.2 ng/mL (ref 0.0–4.0)

## 2020-11-08 ENCOUNTER — Encounter: Payer: Self-pay | Admitting: Family Medicine

## 2020-11-08 ENCOUNTER — Other Ambulatory Visit: Payer: Self-pay

## 2020-11-08 ENCOUNTER — Ambulatory Visit (INDEPENDENT_AMBULATORY_CARE_PROVIDER_SITE_OTHER): Payer: Medicare HMO | Admitting: Family Medicine

## 2020-11-08 VITALS — BP 116/73 | HR 96 | Ht 68.0 in | Wt 206.2 lb

## 2020-11-08 DIAGNOSIS — I10 Essential (primary) hypertension: Secondary | ICD-10-CM | POA: Diagnosis not present

## 2020-11-08 DIAGNOSIS — N522 Drug-induced erectile dysfunction: Secondary | ICD-10-CM

## 2020-11-08 DIAGNOSIS — F4310 Post-traumatic stress disorder, unspecified: Secondary | ICD-10-CM | POA: Diagnosis not present

## 2020-11-08 DIAGNOSIS — K760 Fatty (change of) liver, not elsewhere classified: Secondary | ICD-10-CM | POA: Diagnosis not present

## 2020-11-08 DIAGNOSIS — F339 Major depressive disorder, recurrent, unspecified: Secondary | ICD-10-CM

## 2020-11-08 DIAGNOSIS — E7800 Pure hypercholesterolemia, unspecified: Secondary | ICD-10-CM | POA: Diagnosis not present

## 2020-11-08 DIAGNOSIS — Z1211 Encounter for screening for malignant neoplasm of colon: Secondary | ICD-10-CM | POA: Diagnosis not present

## 2020-11-08 DIAGNOSIS — Z0000 Encounter for general adult medical examination without abnormal findings: Secondary | ICD-10-CM | POA: Diagnosis not present

## 2020-11-08 MED ORDER — PRAVASTATIN SODIUM 40 MG PO TABS: 40.0000 mg | ORAL_TABLET | Freq: Every day | ORAL | 3 refills | Status: DC

## 2020-11-08 MED ORDER — SILDENAFIL CITRATE 20 MG PO TABS
ORAL_TABLET | ORAL | 3 refills | Status: DC
Start: 2020-11-08 — End: 2021-12-17

## 2020-11-08 MED ORDER — AMLODIPINE BESYLATE 5 MG PO TABS: 5.0000 mg | ORAL_TABLET | Freq: Every day | ORAL | 3 refills | Status: DC

## 2020-11-08 MED ORDER — TRAZODONE HCL 100 MG PO TABS: 100.0000 mg | ORAL_TABLET | Freq: Every day | ORAL | 3 refills | Status: DC

## 2020-11-08 MED ORDER — LISINOPRIL-HYDROCHLOROTHIAZIDE 20-25 MG PO TABS
1.0000 | ORAL_TABLET | Freq: Every day | ORAL | 3 refills | Status: DC
Start: 2020-11-08 — End: 2021-11-28

## 2020-11-08 MED ORDER — BUSPIRONE HCL 10 MG PO TABS: 10.0000 mg | ORAL_TABLET | Freq: Two times a day (BID) | ORAL | 3 refills | Status: DC

## 2020-11-08 NOTE — Assessment & Plan Note (Signed)
Controlled HTN - Home BP readings normal  Complication with CKD-II    Plan:  1. Continue current BP regimen - Amlodipine 5mg daily, Lisinopril-HCTZ - 20-25mg daily 2. Encourage improved lifestyle - low sodium diet, regular exercise 3. Continue monitor BP outside office, bring readings to next visit, if persistently >140/90 or new symptoms notify office sooner  Future consider taper off Amlodipine vs lower dose Lisinopril-HCTZ if well controlled now w weight loss 

## 2020-11-08 NOTE — Assessment & Plan Note (Signed)
Controlled depression anxiety PTSD No longer followed by Psych Continues on Trintellix (financial asst from manufacturer, Buspirone, Trazodone  Next financial assistance app order of Trintellix 01/2021

## 2020-11-08 NOTE — Assessment & Plan Note (Signed)
LFTs normalized still

## 2020-11-08 NOTE — Patient Instructions (Addendum)
Thank you for coming to the office today.  Check with [facility] on colonoscopy, date of last cologuard 02/17/18   Gastroenterology [facility]) [address] [facility] [address], [place] [address] Phone: [phone]  DUE for [place] (no food or drink after midnight before the lab appointment, only water or coffee without cream/sugar on the morning of)  SCHEDULE "Lab Only" visit in the morning at the clinic for lab draw in 1 YEAR  - Make sure Lab Only appointment is at about 1 week before your next appointment, so that results will be available  For Lab Results, once available within 2-3 days of blood draw, you can can log in to MyChart online to view your results and a brief explanation. Also, we can discuss results at next follow-up visit.     Please schedule a Follow-up Appointment to: Return in about 1 year (around 11/08/2021) for 1 year fasting lab only then 1 week later Annual Physical.  If you have any other questions or concerns, please feel free to call the office or send a message through [place]. You may also schedule an earlier appointment if necessary.  Additionally, you may be receiving a survey about your experience at our office within a few days to 1 week by e-mail or mail. We value your feedback.  Nobie Putnam, DO [place]

## 2020-11-08 NOTE — Progress Notes (Signed)
Subjective:    Patient ID: Jorge Jordan, male    DOB: 18-Jul-1960, 60 y.o.   MRN: [id number]  Jorge Jordan is a 60 y.o. male presenting on 11/08/2020 for Annual Exam   HPI  Here for Annual Physical and Lab Review.   Elevated A1c - Pre-Diabetes / Obesity BMI >31 Limited activity due to knee procedure current treatment. His lab showed A1c 6.2, slightly higher than last but improved from prior Meds: None (never on meds) Currently on ACEi Lifestyle: Mild weight gain Diet - no significant changes has tried to improve diet, but not always Now abstain from alcohol - Exercise (improving) - Family history of DM Denies hypoglycemia   CHRONIC HTN: Reports checks BP at home with normal BP readings on avg still. 100-120 SBP avg He has lost some weight significantly. Has not changed meds however Current Meds - Lisinopril-HCTZ 20-68m daily, Amlodipine 571mdaily Reports good compliance, took meds today. Tolerating well, w/o complaints. Denies CP, dyspnea, HA, edema, dizziness / lightheadedness   HYPERLIPIDEMIA: - Reports concerns. Last lipid 11/2020 some increase from 110 to 135, improved TG. Lower HDL - Currently taking Pravastatin 403mtolerating well without side effects or myalgias   Hepatic Steatosis Elevated LFTs - resolved, normalized since 2021, normal results on LFT today No new symptoms or concerns. Now abstaining from alcohol mostly, rare occasional drink. Prior [place]2021 showed diffuse changes, see result.   Major Depression, chronic recurrent / Anxiety + PTSD significant improvement Continues to do therapy/counseling every other month virtually now on computer with benefits No longer followed by Psychiatry. - He is tolerating meds well, without concern, see PHQ GAD score below - Continues on Trintellix 55m53mily, Trazodone 100mg25mhtly, Buspirone 55mg 40mnow Trintellix rx authorized on his manufacturer discount  next due 01/2021    Health Maintenance:    Prostate CA Screening: Prior PSA / DRE reported normal. Last PSA 1.2 (11/2020). Currently asymptomatic without any BPH LUTS. No known family history of prostate CA.   Colon CA Screening: Never had colonoscopy. Screening in the past with [facility] FIT test prior to 2019. Also has had Cologuard 02/17/18 (negative). Currently asymptomatic. No known family history of colon CA. Not due yet. Next due in 02/2021 - he is interested in Colonoscopy (initial colonoscopy, had not had before)    Depression screen PHQ 2/[facility]/06/2020 09/05/2020 10/25/2019  Decreased Interest 0 0 1  Down, Depressed, Hopeless 1 0 1  PHQ - 2 Score 1 0 2  Altered sleeping 2 - 0  Tired, decreased energy 1 - 0  Change in appetite 2 - 1  Feeling bad or failure about yourself  1 - 1  Trouble concentrating 1 - 0  Moving slowly or fidgety/restless 1 - 0  Suicidal thoughts 0 - 0  PHQ-9 Score 9 - 4  Difficult doing work/chores Somewhat difficult - Somewhat difficult   GAD 7 : Generalized Anxiety Score 11/08/2020 10/25/2019 01/26/2018 07/21/2017  Nervous, Anxious, on Edge 1 1 0 2  Control/stop worrying 1 0 0 2  Worry too much - different things 2 1 0 1  Trouble relaxing 2 1 0 1  Restless 1 0 0 1  Easily annoyed or irritable 1 1 0 1  Afraid - awful might happen 1 0 0 1  Total GAD 7 Score 9 4 0 9  Anxiety Difficulty Somewhat difficult Somewhat difficult Not difficult at all Very difficult      Past Medical History:  Diagnosis Date  Allergy    Anxiety    Arthritis    Foot deformity    foot arthrodesis L foot   Hyperlipidemia    Osteoporosis    Shoulder bursitis    both    Tendinitis    elbow   Past Surgical History:  Procedure Laterality Date   FOOT SURGERY Left 2001   Triple arthrodesis   QUADRICEPS TENDON REPAIR Right 07/14/2020   Procedure: REPAIR QUADRICEP TENDON;  Surgeon: Hessie Knows, MD;  Location: [facility] ORS;  Service: Orthopedics;  Laterality: Right;   REPAIR QUADRICEPS/HAMSTRING MUSCLES Right 05/04/2020    Procedure: REPAIR QUADRICEPS/HAMSTRING MUSCLES;  Surgeon: Hessie Knows, MD;  Location: [facility] ORS;  Service: Orthopedics;  Laterality: Right;  RIGHT QUADRICEPS   ROTATOR CUFF REPAIR Left    SHOULDER FUSION SURGERY     WRIST SURGERY     ganglion cyst   Social History   Socioeconomic History   Marital status: Married    Spouse name: Not on file   Number of children: Not on file   Years of education: Not on file   Highest education level: Not on file  Occupational History   Occupation: disability   Tobacco Use   Smoking status: Never   Smokeless tobacco: Never  Vaping Use   Vaping Use: Never used  Substance and Sexual Activity   Alcohol use: Not Currently    Alcohol/week: 0.0 standard drinks   Drug use: No   Sexual activity: Not on file  Other Topics Concern   Not on file  Social History Narrative   Not on file   Social Determinants of Health   Financial Resource Strain: Low Risk    Difficulty of Paying Living Expenses: Not hard at all  Food Insecurity: No Food Insecurity   Worried About Charity fundraiser in the Last Year: Never true   [place] in the Last Year: Never true  Transportation Needs: No Transportation Needs   Lack of Transportation (Medical): No   Lack of Transportation (Non-Medical): No  Physical Activity: Inactive   Days of Exercise per Week: 0 days   Minutes of Exercise per Session: 0 min  Stress: No Stress Concern Present   Feeling of Stress : Not at all  Social Connections: Not on file  Intimate Partner Violence: Not on file   Family History  Problem Relation Age of Onset   Hyperlipidemia Mother    Hypertension Mother    Diabetes Mother    Depression Mother    Hypertension Father    Hyperlipidemia Father    Cancer Father        skin cancer   Hypertension Brother    Diabetes Maternal Grandmother    Heart attack Maternal Grandfather    Current Outpatient Medications on File Prior to Visit  Medication Sig   acetaminophen (TYLENOL)  500 MG tablet Take 1,000 mg by mouth every 6 (six) hours as needed.   aspirin EC 81 MG tablet Take 81 mg by mouth daily.   vortioxetine HBr (TRINTELLIX) 10 MG TABS tablet Take 1 tablet (10 mg total) by mouth daily.   No current facility-administered medications on file prior to visit.    Review of Systems  Constitutional:  Negative for activity change, appetite change, chills, diaphoresis, fatigue and fever.  HENT:  Negative for congestion and hearing loss.   Eyes:  Negative for visual disturbance.  Respiratory:  Negative for cough, chest tightness, shortness of breath and wheezing.   Cardiovascular:  Negative for chest  pain, palpitations and leg swelling.  Gastrointestinal:  Negative for abdominal pain, constipation, diarrhea, nausea and vomiting.  Genitourinary:  Negative for dysuria, frequency and hematuria.  Musculoskeletal:  Negative for arthralgias and neck pain.  Skin:  Negative for rash.  Neurological:  Negative for dizziness, weakness, light-headedness, numbness and headaches.  Hematological:  Negative for adenopathy.  Psychiatric/Behavioral:  Negative for behavioral problems, dysphoric mood and sleep disturbance.   Per HPI unless specifically indicated above      Objective:    BP 116/73   Pulse 96   Ht '5\' 8"'  (1.727 m)   Wt 206 lb 3.2 oz (93.5 kg)   SpO2 98%   BMI 31.35 kg/m   Wt Readings from Last 3 Encounters:  11/08/20 206 lb 3.2 oz (93.5 kg)  09/05/20 200 lb (90.7 kg)  07/14/20 200 lb (90.7 kg)    Physical Exam Vitals and nursing note reviewed.  Constitutional:      General: He is not in acute distress.    Appearance: He is well-developed. He is not diaphoretic.     Comments: Well-appearing, comfortable, cooperative  HENT:     Head: Normocephalic and atraumatic.  Eyes:     General:        Right eye: No discharge.        Left eye: No discharge.     Conjunctiva/sclera: Conjunctivae normal.     Pupils: Pupils are equal, round, and reactive to light.   Neck:     Thyroid: No thyromegaly.  Cardiovascular:     Rate and Rhythm: Normal rate and regular rhythm.     Pulses: Normal pulses.     Heart sounds: Normal heart sounds. No murmur heard. Pulmonary:     Effort: Pulmonary effort is normal. No respiratory distress.     Breath sounds: Normal breath sounds. No wheezing or rales.  Abdominal:     General: Bowel sounds are normal. There is no distension.     Palpations: Abdomen is soft. There is no mass.     Tenderness: There is no abdominal tenderness.  Musculoskeletal:        General: No tenderness. Normal range of motion.     Cervical back: Normal range of motion and neck supple.     Comments: Upper / Lower Extremities: - Normal muscle tone, strength bilateral upper extremities 5/5, lower extremities 5/5  Lymphadenopathy:     Cervical: No cervical adenopathy.  Skin:    General: Skin is warm and dry.     Findings: No erythema or rash.  Neurological:     Mental Status: He is alert and oriented to person, place, and time.     Comments: Distal sensation intact to light touch all extremities  Psychiatric:        Mood and Affect: Mood normal.        Behavior: Behavior normal.        Thought Content: Thought content normal.     Comments: Well groomed, good eye contact, normal speech and thoughts     Results for orders placed or performed in visit on 11/01/20  HgB A1c  Result Value Ref Range   Hgb A1c MFr Bld 6.2 (H) 4.8 - 5.6 %   Est. average glucose Bld gHb Est-mCnc 131 mg/dL  CBC with Differential  Result Value Ref Range   WBC 5.1 3.4 - 10.8 x10E3/uL   RBC 5.10 4.14 - 5.80 x10E6/uL   Hemoglobin 16.9 13.0 - 17.7 g/dL   Hematocrit 47.3 37.5 - 51.0 %  MCV 93 79 - 97 fL   MCH 33.1 (H) 26.6 - 33.0 pg   MCHC 35.7 31.5 - 35.7 g/dL   RDW 12.5 11.6 - 15.4 %   Platelets 244 150 - 450 x10E3/uL   Neutrophils 50 Not Estab. %   Lymphs 31 Not Estab. %   Monocytes 14 Not Estab. %   Eos 4 Not Estab. %   Basos 1 Not Estab. %    Neutrophils Absolute 2.6 1.4 - 7.0 x10E3/uL   Lymphocytes Absolute 1.6 0.7 - 3.1 x10E3/uL   Monocytes Absolute 0.7 0.1 - 0.9 x10E3/uL   EOS (ABSOLUTE) 0.2 0.0 - 0.4 x10E3/uL   Basophils Absolute 0.1 0.0 - 0.2 x10E3/uL   Immature Granulocytes 0 Not Estab. %   Immature Grans (Abs) 0.0 0.0 - 0.1 x10E3/uL  Lipid panel  Result Value Ref Range   Cholesterol, Total 207 (H) 100 - 199 mg/dL   Triglycerides 270 (H) 0 - 149 mg/dL   HDL 47 >39 mg/dL   VLDL Cholesterol Cal 47 (H) 5 - 40 mg/dL   LDL Chol Calc (NIH) 113 (H) 0 - 99 mg/dL   Chol/HDL Ratio 4.4 0.0 - 5.0 ratio  PSA  Result Value Ref Range   Prostate Specific Ag, Serum 1.2 0.0 - 4.0 ng/mL  Comprehensive Metabolic Panel (CMET)  Result Value Ref Range   Glucose 133 (H) 65 - 99 mg/dL   BUN 14 6 - 24 mg/dL   Creatinine, Ser 1.08 0.76 - 1.27 mg/dL   eGFR 79 >59 mL/min/1.73   BUN/Creatinine Ratio 13 9 - 20   Sodium 137 134 - 144 mmol/L   Potassium 4.2 3.5 - 5.2 mmol/L   Chloride 98 96 - 106 mmol/L   CO2 22 20 - 29 mmol/L   Calcium 9.7 8.7 - 10.2 mg/dL   Total Protein 7.0 6.0 - 8.5 g/dL   Albumin 4.3 3.8 - 4.9 g/dL   Globulin, Total 2.7 1.5 - 4.5 g/dL   Albumin/Globulin Ratio 1.6 1.2 - 2.2   Bilirubin Total 0.6 0.0 - 1.2 mg/dL   Alkaline Phosphatase 111 44 - 121 IU/L   AST 23 0 - 40 IU/L   ALT 30 0 - 44 IU/L      Assessment & Plan:   Problem List Items Addressed This Visit     Screening for colon cancer   Relevant Orders   Ambulatory referral to Gastroenterology   PTSD (post-traumatic stress disorder)    See A&P Depression       Relevant Medications   busPIRone (BUSPAR) 10 MG tablet   traZODone (DESYREL) 100 MG tablet   Major depressive disorder, recurrent episode with anxious distress (HCC)    Controlled depression anxiety PTSD No longer followed by Psych Continues on Trintellix (financial asst from manufacturer, Buspirone, Trazodone  Next financial assistance app order of Trintellix 01/2021       Relevant  Medications   busPIRone (BUSPAR) 10 MG tablet   traZODone (DESYREL) 100 MG tablet   Hepatic steatosis    LFTs normalized still       Essential hypertension    Controlled HTN - Home BP readings normal  Complication with CKD-II    Plan:  1. Continue current BP regimen - Amlodipine 57m daily, Lisinopril-HCTZ - 20-25mdaily 2. Encourage improved lifestyle - low sodium diet, regular exercise 3. Continue monitor BP outside office, bring readings to next visit, if persistently >140/90 or new symptoms notify office sooner  Future consider taper off Amlodipine vs lower dose  Lisinopril-HCTZ if well controlled now w weight loss       Relevant Medications   amLODipine (NORVASC) 5 MG tablet   lisinopril-hydrochlorothiazide (ZESTORETIC) 20-25 MG tablet   pravastatin (PRAVACHOL) 40 MG tablet   sildenafil (REVATIO) 20 MG tablet   Other Visit Diagnoses     Annual physical exam    -  Primary   Hypercholesterolemia       Relevant Medications   amLODipine (NORVASC) 5 MG tablet   lisinopril-hydrochlorothiazide (ZESTORETIC) 20-25 MG tablet   pravastatin (PRAVACHOL) 40 MG tablet   sildenafil (REVATIO) 20 MG tablet   Drug-induced erectile dysfunction       Relevant Medications   sildenafil (REVATIO) 20 MG tablet      Updated Health Maintenance information  Referral to [facility] for Colonoscopy, initial screen, has had negative cologuard 02/17/18 - now due since 3 years  Reviewed recent lab results with patient Encouraged improvement to lifestyle with diet and exercise  Goal of weight loss   Refilled meds for 1 year  Meds ordered this encounter  Medications   amLODipine (NORVASC) 5 MG tablet    Sig: Take 1 tablet (5 mg total) by mouth daily.    Dispense:  90 tablet    Refill:  3   busPIRone (BUSPAR) 10 MG tablet    Sig: Take 1 tablet (10 mg total) by mouth in the morning and at bedtime.    Dispense:  180 tablet    Refill:  3   lisinopril-hydrochlorothiazide (ZESTORETIC) 20-25 MG  tablet    Sig: Take 1 tablet by mouth daily.    Dispense:  90 tablet    Refill:  3   pravastatin (PRAVACHOL) 40 MG tablet    Sig: Take 1 tablet (40 mg total) by mouth daily.    Dispense:  90 tablet    Refill:  3   sildenafil (REVATIO) 20 MG tablet    Sig: Take 3-5 tablets as needed 30 prior to sex.    Dispense:  30 tablet    Refill:  3   traZODone (DESYREL) 100 MG tablet    Sig: Take 1 tablet (100 mg total) by mouth at bedtime.    Dispense:  90 tablet    Refill:  3      Follow up plan: Return in about 1 year (around 11/08/2021) for 1 year fasting lab only then 1 week later Annual Physical.    Nobie Putnam, DO [facility] 11/08/2020, 10:57 AM

## 2020-11-08 NOTE — Assessment & Plan Note (Signed)
See A&P Depression

## 2020-11-13 ENCOUNTER — Other Ambulatory Visit: Payer: Self-pay

## 2020-11-13 MED ORDER — NA SULFATE-K SULFATE-MG SULF 17.5-3.13-1.6 GM/177ML PO SOLN: 1.0000 | ORAL | 0 refills | Status: DC

## 2020-11-21 ENCOUNTER — Telehealth: Payer: Self-pay

## 2020-11-21 NOTE — Telephone Encounter (Signed)
Returned patients call. Explained bowel prep instructions to patient. Informed patient the instructions are listed on his paperwork for reference. Pt verbalized understanding.

## 2020-11-22 ENCOUNTER — Ambulatory Visit
Admission: RE | Admit: 2020-11-22 | Discharge: 2020-11-22 | Disposition: A | Discharge status: Home / Self Care | Payer: Medicare HMO | Attending: Gastroenterology | Admitting: Gastroenterology

## 2020-11-22 ENCOUNTER — Ambulatory Visit: Payer: Medicare HMO | Admitting: Anesthesiology

## 2020-11-22 ENCOUNTER — Encounter
Admission: RE | Disposition: A | Discharge status: Home / Self Care | Payer: Self-pay | Source: Home / Self Care | Attending: Gastroenterology

## 2020-11-22 ENCOUNTER — Encounter: Payer: Self-pay | Admitting: Gastroenterology

## 2020-11-22 DIAGNOSIS — Z1211 Encounter for screening for malignant neoplasm of colon: Secondary | ICD-10-CM | POA: Diagnosis not present

## 2020-11-22 DIAGNOSIS — Z79899 Other long term (current) drug therapy: Secondary | ICD-10-CM | POA: Insufficient documentation

## 2020-11-22 DIAGNOSIS — Z7982 Long term (current) use of aspirin: Secondary | ICD-10-CM | POA: Insufficient documentation

## 2020-11-22 DIAGNOSIS — K648 Other hemorrhoids: Secondary | ICD-10-CM | POA: Diagnosis not present

## 2020-11-22 HISTORY — PX: COLONOSCOPY: SHX5424

## 2020-11-22 SURGERY — COLONOSCOPY
Anesthesia: General

## 2020-11-22 MED ORDER — PROPOFOL 500 MG/50ML IV EMUL
INTRAVENOUS | Status: DC | PRN
  Administered 2020-11-22: 150 ug/kg/min via INTRAVENOUS

## 2020-11-22 MED ORDER — PROPOFOL 10 MG/ML IV BOLUS
INTRAVENOUS | Status: DC | PRN
  Administered 2020-11-22: 100 mg via INTRAVENOUS

## 2020-11-22 MED ORDER — SODIUM CHLORIDE 0.9 % IV SOLN: INTRAVENOUS | Status: DC

## 2020-11-22 MED ORDER — LIDOCAINE HCL (PF) 2 % IJ SOLN
INTRAMUSCULAR | Status: AC
  Filled 2020-11-22: qty 5

## 2020-11-22 MED ORDER — DEXMEDETOMIDINE HCL IN NACL 400 MCG/100ML IV SOLN
INTRAVENOUS | Status: DC | PRN
  Administered 2020-11-22: 12 ug via INTRAVENOUS

## 2020-11-22 MED ORDER — LIDOCAINE HCL (CARDIAC) PF 100 MG/5ML IV SOSY
PREFILLED_SYRINGE | INTRAVENOUS | Status: DC | PRN
  Administered 2020-11-22: 50 mg via INTRAVENOUS

## 2020-11-22 NOTE — H&P (Signed)
Vonda Antigua, MD [address] [place], [place], [place], [address] [address], [place], [place], [place], [address] Phone: [phone]  Fax: [phone]  Primary Care Physician:  Olin Hauser, DO   Pre-Procedure History & Physical: HPI:  Jorge Jordan is a 60 y.o. male is here for a colonoscopy.   Past Medical History:  Diagnosis Date   Allergy    Anxiety    Arthritis    Foot deformity    foot arthrodesis L foot   Hyperlipidemia    Osteoporosis    Shoulder bursitis    both    Tendinitis    elbow    Past Surgical History:  Procedure Laterality Date   FOOT SURGERY Left 2001   Triple arthrodesis   QUADRICEPS TENDON REPAIR Right 07/14/2020   Procedure: REPAIR QUADRICEP TENDON;  Surgeon: Hessie Knows, MD;  Location: [facility] ORS;  Service: Orthopedics;  Laterality: Right;   REPAIR QUADRICEPS/HAMSTRING MUSCLES Right 05/04/2020   Procedure: REPAIR QUADRICEPS/HAMSTRING MUSCLES;  Surgeon: Hessie Knows, MD;  Location: [facility] ORS;  Service: Orthopedics;  Laterality: Right;  RIGHT QUADRICEPS   ROTATOR CUFF REPAIR Left    SHOULDER FUSION SURGERY     WRIST SURGERY     ganglion cyst    Prior to Admission medications   Medication Sig Start Date End Date Taking? Authorizing Provider  acetaminophen (TYLENOL) 500 MG tablet Take 1,000 mg by mouth every 6 (six) hours as needed.   Yes [provider]  amLODipine (NORVASC) 5 MG tablet Take 1 tablet (5 mg total) by mouth daily. 11/08/20  Yes Karamalegos, Devonne Doughty, DO  aspirin EC 81 MG tablet Take 81 mg by mouth daily.   Yes [provider]  busPIRone (BUSPAR) 10 MG tablet Take 1 tablet (10 mg total) by mouth in the morning and at bedtime. 11/08/20  Yes Karamalegos, Devonne Doughty, DO  lisinopril-hydrochlorothiazide (ZESTORETIC) 20-25 MG tablet Take 1 tablet by mouth daily. 11/08/20  Yes Karamalegos, Devonne Doughty, DO  pravastatin (PRAVACHOL) 40 MG tablet Take 1 tablet (40 mg total) by mouth daily. 11/08/20  Yes  Karamalegos, Devonne Doughty, DO  traZODone (DESYREL) 100 MG tablet Take 1 tablet (100 mg total) by mouth at bedtime. 11/08/20  Yes Karamalegos, Devonne Doughty, DO  vortioxetine HBr (TRINTELLIX) 10 MG TABS tablet Take 1 tablet (10 mg total) by mouth daily. 07/02/17  Yes Karamalegos, Alexander J, DO  Na Sulfate-K Sulfate-Mg Sulf (SUPREP BOWEL PREP KIT) 17.5-3.13-1.6 GM/177ML SOLN Take 1 kit by mouth as directed. 11/13/20   Virgel Manifold, MD  sildenafil (REVATIO) 20 MG tablet Take 3-5 tablets as needed 30 prior to sex. 11/08/20   Olin Hauser, DO    Allergies as of 11/13/2020   (No Known Allergies)    Family History  Problem Relation Age of Onset   Hyperlipidemia Mother    Hypertension Mother    Diabetes Mother    Depression Mother    Hypertension Father    Hyperlipidemia Father    Cancer Father        skin cancer   Hypertension Brother    Diabetes Maternal Grandmother    Heart attack Maternal Grandfather     Social History   Socioeconomic History   Marital status: Married    Spouse name: Not on file   Number of children: Not on file   Years of education: Not on file   Highest education level: Not on file  Occupational History   Occupation: disability   Tobacco Use  Smoking status: Never   Smokeless tobacco: Never  Vaping Use   Vaping Use: Never used  Substance and Sexual Activity   Alcohol use: Yes    Comment: occasionally   Drug use: No   Sexual activity: Not on file  Other Topics Concern   Not on file  Social History Narrative   Not on file   Social Determinants of Health   Financial Resource Strain: Low Risk    Difficulty of Paying Living Expenses: Not hard at all  Food Insecurity: No Food Insecurity   Worried About Charity fundraiser in the Last Year: Never true   [place] in the Last Year: Never true  Transportation Needs: No Transportation Needs   Lack of Transportation (Medical): No   Lack of Transportation (Non-Medical): No  Physical  Activity: Inactive   Days of Exercise per Week: 0 days   Minutes of Exercise per Session: 0 min  Stress: No Stress Concern Present   Feeling of Stress : Not at all  Social Connections: Not on file  Intimate Partner Violence: Not on file    Review of Systems: See HPI, otherwise negative ROS  Physical Exam: Constitutional: General:   Alert,  Well-developed, well-nourished, pleasant and cooperative in NAD BP 114/76   Pulse (!) 103   Temp (!) 96.2 F (35.7 C) (Temporal)   Resp 18   Ht '5\' 8"'  (1.727 m)   Wt 92.1 kg   SpO2 99%   BMI 30.87 kg/m   Head: Normocephalic, atraumatic.   Eyes:  Sclera clear, no icterus.   Conjunctiva pink.   Mouth:  No deformity or lesions, oropharynx pink & moist.  Neck:  Supple, trachea midline  Respiratory: Normal respiratory effort  Gastrointestinal:  Soft, non-tender and non-distended without masses, hepatosplenomegaly or hernias noted.  No guarding or rebound tenderness.     Cardiac: No clubbing or edema.  No cyanosis. Normal posterior tibial pedal pulses noted.  Lymphatic:  No significant cervical adenopathy.  Psych:  Alert and cooperative. Normal mood and affect.  Musculoskeletal:   Symmetrical without gross deformities. 5/5 Lower extremity strength bilaterally.  Skin: Warm. Intact without significant lesions or rashes. No jaundice.  Neurologic:  Face symmetrical, tongue midline, Normal sensation to touch;  grossly normal neurologically.  Psych:  Alert and oriented x3, Alert and cooperative. Normal mood and affect.  Impression/Plan: Jorge Jordan is here for a colonoscopy to be performed for average risk screening.  Risks, benefits, limitations, and alternatives regarding  colonoscopy have been reviewed with the patient.  Questions have been answered.  All parties agreeable.   Virgel Manifold, MD  11/22/2020, 8:06 AM

## 2020-11-22 NOTE — Anesthesia Preprocedure Evaluation (Signed)
Anesthesia Evaluation  Patient identified by MRN, date of birth, ID band Patient awake    Reviewed: Allergy & Precautions, NPO status , Patient's Chart, lab work & pertinent test results  History of Anesthesia Complications Negative for: history of anesthetic complications  Airway Mallampati: III  TM Distance: >3 FB Neck ROM: Full    Dental no notable dental hx. (+) Teeth Intact   Pulmonary neg pulmonary ROS, neg sleep apnea, neg COPD, Patient abstained from smoking.Not current smoker,    Pulmonary exam normal breath sounds clear to auscultation       Cardiovascular Exercise Tolerance: Good METShypertension, (-) CAD and (-) Past MI (-) dysrhythmias  Rhythm:Regular Rate:Normal - Systolic murmurs    Neuro/Psych PSYCHIATRIC DISORDERS Anxiety Depression negative neurological ROS     GI/Hepatic neg GERD  ,(+)     (-) substance abuse  ,   Endo/Other  neg diabetes  Renal/GU negative Renal ROS     Musculoskeletal  (+) Arthritis , Osteoarthritis,    Abdominal   Peds  Hematology   Anesthesia Other Findings Past Medical History: No date: Allergy No date: Anxiety No date: Arthritis No date: Foot deformity     Comment:  foot arthrodesis L foot No date: Hyperlipidemia No date: Osteoporosis No date: Shoulder bursitis     Comment:  both  No date: Tendinitis     Comment:  elbow  Reproductive/Obstetrics                             Anesthesia Physical Anesthesia Plan  ASA: 2  Anesthesia Plan: General   Post-op Pain Management:    Induction: Intravenous  PONV Risk Score and Plan: 2 and Ondansetron, Propofol infusion and TIVA  Airway Management Planned: Nasal Cannula  Additional Equipment: None  Intra-op Plan:   Post-operative Plan:   Informed Consent: I have reviewed the patients History and Physical, chart, labs and discussed the procedure including the risks, benefits and  alternatives for the proposed anesthesia with the patient or authorized representative who has indicated his/her understanding and acceptance.     Dental advisory given  Plan Discussed with: CRNA and Surgeon  Anesthesia Plan Comments: (Discussed risks of anesthesia with patient, including possibility of difficulty with spontaneous ventilation under anesthesia necessitating airway intervention, PONV, and rare risks such as cardiac or respiratory or neurological events, and allergic reactions. Patient understands.)        Anesthesia Quick Evaluation

## 2020-11-22 NOTE — Transfer of Care (Signed)
Immediate Anesthesia Transfer of Care Note  Patient: [name]  Procedure(s) Performed: COLONOSCOPY  Patient Location: PACU and Nursing Unit  Anesthesia Type:General  Level of Consciousness: drowsy and patient cooperative  Airway & Oxygen Therapy: Patient Spontanous Breathing  Post-op Assessment: Report given to RN and Post -op Vital signs reviewed and stable  Post vital signs: Reviewed and stable  Last Vitals:  Vitals Value Taken Time  BP 96/73 11/22/20 0845  Temp 36 C 11/22/20 0841  Pulse 84 11/22/20 0844  Resp 18 11/22/20 0845  SpO2 96 % 11/22/20 0845  Vitals shown include unvalidated device data.  Last Pain:  Vitals:   11/22/20 0841  TempSrc: Temporal  PainSc: 0-No pain         Complications: No notable events documented.

## 2020-11-22 NOTE — Op Note (Addendum)
[facility] Gastroenterology Patient Name: Jorge Jordan Procedure Date: 11/22/2020 8:05 AM MRN: [id number] Account #: [id number] Date of Birth: Sep 18, 1960 Admit Type: Outpatient Age: 60 Room: [facility] ENDO ROOM 4 Gender: Male Note Status: Finalized Procedure:             Colonoscopy Indications:           Screening for colorectal malignant neoplasm Providers:             Happy Ky B. Bonna Gains MD, MD Medicines:             Monitored Anesthesia Care Complications:         No immediate complications. Procedure:             Pre-Anesthesia Assessment:                        - Prior to the procedure, a History and Physical was                         performed, and patient medications, allergies and                         sensitivities were reviewed. The patient's tolerance                         of previous anesthesia was reviewed.                        - The risks and benefits of the procedure and the                         sedation options and risks were discussed with the                         patient. All questions were answered and informed                         consent was obtained.                        - Patient identification and proposed procedure were                         verified prior to the procedure by the physician, the                         nurse, the anesthetist and the technician. The                         procedure was verified in the pre-procedure area in                         the procedure room in the endoscopy suite.                        - ASA Grade Assessment: II - A patient with mild                         systemic disease.                        -  After reviewing the risks and benefits, the patient                         was deemed in satisfactory condition to undergo the                         procedure.                        After obtaining informed consent, the colonoscope was                         passed under direct  vision. Throughout the procedure,                         the patient's blood pressure, pulse, and oxygen                         saturations were monitored continuously. The                         Colonoscope was introduced through the anus and                         advanced to the the cecum, identified by appendiceal                         orifice and ileocecal valve. The colonoscopy was                         performed with ease. The patient tolerated the                         procedure well. The quality of the bowel preparation                         was fair. Findings:      The perianal and digital rectal examinations were normal.      The rectum, sigmoid colon, descending colon, transverse colon, ascending       colon and cecum appeared normal.      Non-bleeding internal hemorrhoids were found during retroflexion.      No additional abnormalities were found on retroflexion. Impression:            - Preparation of the colon was fair.                        - The rectum, sigmoid colon, descending colon,                         transverse colon, ascending colon and cecum are normal.                        - Non-bleeding internal hemorrhoids.                        - No specimens collected. Recommendation:        - Discharge patient to home.                        -  High fiber diet.                        - Resume previous diet.                        - Continue present medications.                        - Repeat colonoscopy in 1-2 years, with 2 day prep,                         because the bowel preparation was suboptimal.                        - Return to primary care physician as previously                         scheduled.                        - The findings and recommendations were discussed with                         the patient.                        - The findings and recommendations were discussed with                         the patient's family. Procedure  Code(s):     --- Professional ---                        [phone], Colonoscopy, flexible; diagnostic, including                         collection of specimen(s) by brushing or washing, when                         performed (separate procedure) Diagnosis Code(s):     --- Professional ---                        Z12.11, Encounter for screening for malignant neoplasm                         of colon CPT copyright 2019 American [employer] Association. All rights reserved. The codes documented in this report are preliminary and upon coder review may  be revised to meet current compliance requirements.  Vonda Antigua, MD Margretta Sidle B. Bonna Gains MD, MD 11/22/2020 8:44:53 AM This report has been signed electronically. Number of Addenda: 0 Note Initiated On: 11/22/2020 8:05 AM Scope Withdrawal Time: 0 hours 9 minutes 39 seconds  Total Procedure Duration: 0 hours 11 minutes 49 seconds       [facility]

## 2020-11-22 NOTE — Anesthesia Postprocedure Evaluation (Signed)
Anesthesia Post Note  Patient: Jorge Jordan  Procedure(s) Performed: COLONOSCOPY  Patient location during evaluation: Endoscopy Anesthesia Type: General Level of consciousness: awake and alert Pain management: pain level controlled Vital Signs Assessment: post-procedure vital signs reviewed and stable Respiratory status: spontaneous breathing, nonlabored ventilation, respiratory function stable and patient connected to nasal cannula oxygen Cardiovascular status: blood pressure returned to baseline and stable Postop Assessment: no apparent nausea or vomiting Anesthetic complications: no   No notable events documented.   Last Vitals:  Vitals:   11/22/20 0845 11/22/20 0907  BP: 96/73 110/78  Pulse:  78  Resp: 18 20  Temp:    SpO2: 96%     Last Pain:  Vitals:   11/22/20 0851  TempSrc:   PainSc: 0-No pain                 Arita Miss

## 2020-11-23 ENCOUNTER — Encounter: Payer: Self-pay | Admitting: Gastroenterology

## 2020-12-15 DIAGNOSIS — S76111A Strain of right quadriceps muscle, fascia and tendon, initial encounter: Secondary | ICD-10-CM | POA: Diagnosis not present

## 2020-12-27 DIAGNOSIS — S76111A Strain of right quadriceps muscle, fascia and tendon, initial encounter: Secondary | ICD-10-CM | POA: Diagnosis not present

## 2020-12-27 DIAGNOSIS — M25461 Effusion, right knee: Secondary | ICD-10-CM | POA: Diagnosis not present

## 2020-12-27 DIAGNOSIS — M23351 Other meniscus derangements, posterior horn of lateral meniscus, right knee: Secondary | ICD-10-CM | POA: Diagnosis not present

## 2020-12-27 DIAGNOSIS — S83231A Complex tear of medial meniscus, current injury, right knee, initial encounter: Secondary | ICD-10-CM | POA: Diagnosis not present

## 2020-12-27 DIAGNOSIS — M94261 Chondromalacia, right knee: Secondary | ICD-10-CM | POA: Diagnosis not present

## 2020-12-27 DIAGNOSIS — M23361 Other meniscus derangements, other lateral meniscus, right knee: Secondary | ICD-10-CM | POA: Diagnosis not present

## 2021-01-10 DIAGNOSIS — S83271D Complex tear of lateral meniscus, current injury, right knee, subsequent encounter: Secondary | ICD-10-CM | POA: Diagnosis not present

## 2021-01-10 DIAGNOSIS — S83231D Complex tear of medial meniscus, current injury, right knee, subsequent encounter: Secondary | ICD-10-CM | POA: Diagnosis not present

## 2021-01-10 DIAGNOSIS — S76111D Strain of right quadriceps muscle, fascia and tendon, subsequent encounter: Secondary | ICD-10-CM | POA: Diagnosis not present

## 2021-01-19 DIAGNOSIS — D2271 Melanocytic nevi of right lower limb, including hip: Secondary | ICD-10-CM | POA: Diagnosis not present

## 2021-01-19 DIAGNOSIS — X32XXXA Exposure to sunlight, initial encounter: Secondary | ICD-10-CM | POA: Diagnosis not present

## 2021-01-19 DIAGNOSIS — D2272 Melanocytic nevi of left lower limb, including hip: Secondary | ICD-10-CM | POA: Diagnosis not present

## 2021-01-19 DIAGNOSIS — D225 Melanocytic nevi of trunk: Secondary | ICD-10-CM | POA: Diagnosis not present

## 2021-01-19 DIAGNOSIS — D2262 Melanocytic nevi of left upper limb, including shoulder: Secondary | ICD-10-CM | POA: Diagnosis not present

## 2021-01-19 DIAGNOSIS — L570 Actinic keratosis: Secondary | ICD-10-CM | POA: Diagnosis not present

## 2021-01-19 DIAGNOSIS — D485 Neoplasm of uncertain behavior of skin: Secondary | ICD-10-CM | POA: Diagnosis not present

## 2021-01-19 DIAGNOSIS — D2261 Melanocytic nevi of right upper limb, including shoulder: Secondary | ICD-10-CM | POA: Diagnosis not present

## 2021-01-19 DIAGNOSIS — C4442 Squamous cell carcinoma of skin of scalp and neck: Secondary | ICD-10-CM | POA: Diagnosis not present

## 2021-01-19 DIAGNOSIS — L821 Other seborrheic keratosis: Secondary | ICD-10-CM | POA: Diagnosis not present

## 2021-03-16 DIAGNOSIS — Z481 Encounter for planned postprocedural wound closure: Secondary | ICD-10-CM | POA: Diagnosis not present

## 2021-03-16 DIAGNOSIS — C4492 Squamous cell carcinoma of skin, unspecified: Secondary | ICD-10-CM | POA: Diagnosis not present

## 2021-03-16 DIAGNOSIS — C44329 Squamous cell carcinoma of skin of other parts of face: Secondary | ICD-10-CM | POA: Diagnosis not present

## 2021-04-02 IMAGING — CR DG KNEE COMPLETE 4+V*R*
4 series · 4 of 4 positions shown · non-contrast
Comparison: None.

CLINICAL DATA: Right knee pain, fall

EXAM:
RIGHT KNEE - COMPLETE 4+ VIEW

[knee ap]
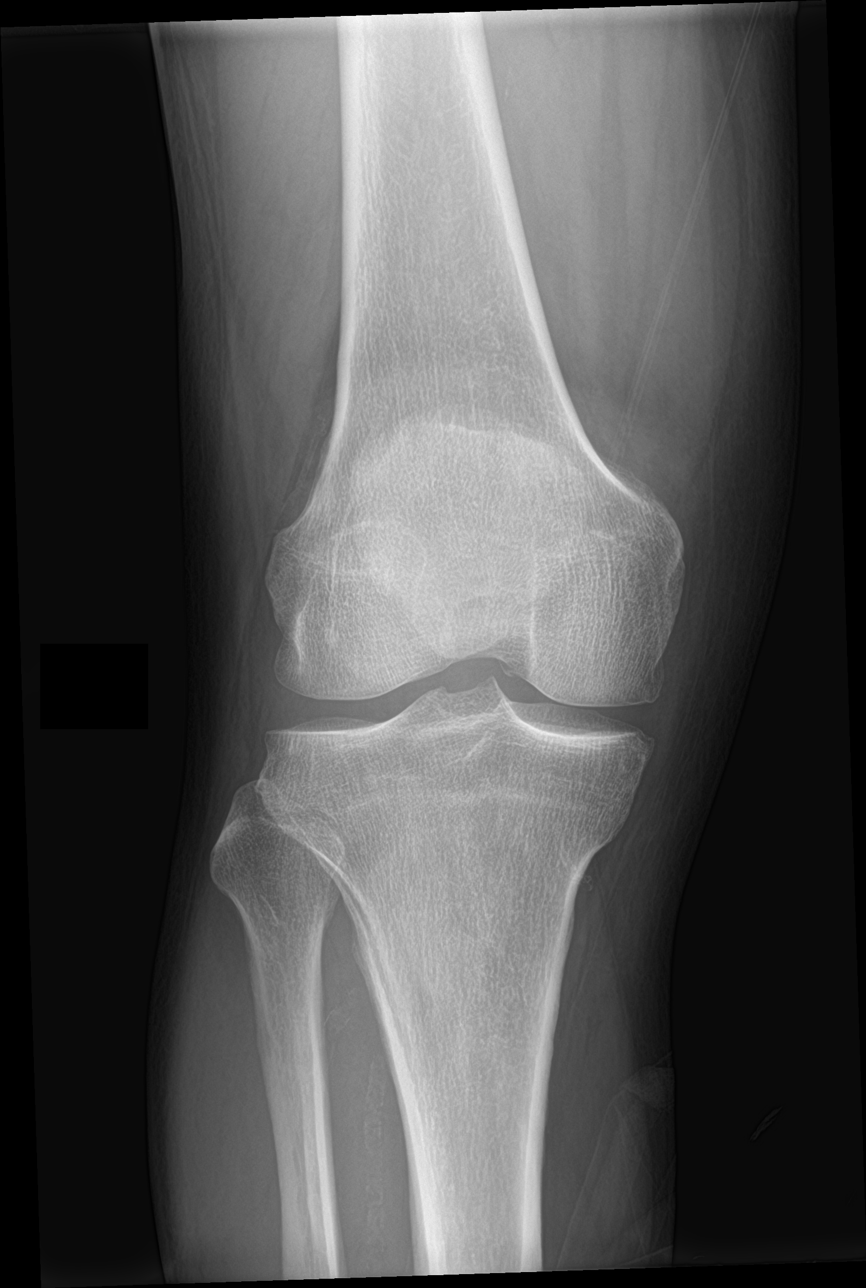

[knee obl (1 of 2)]
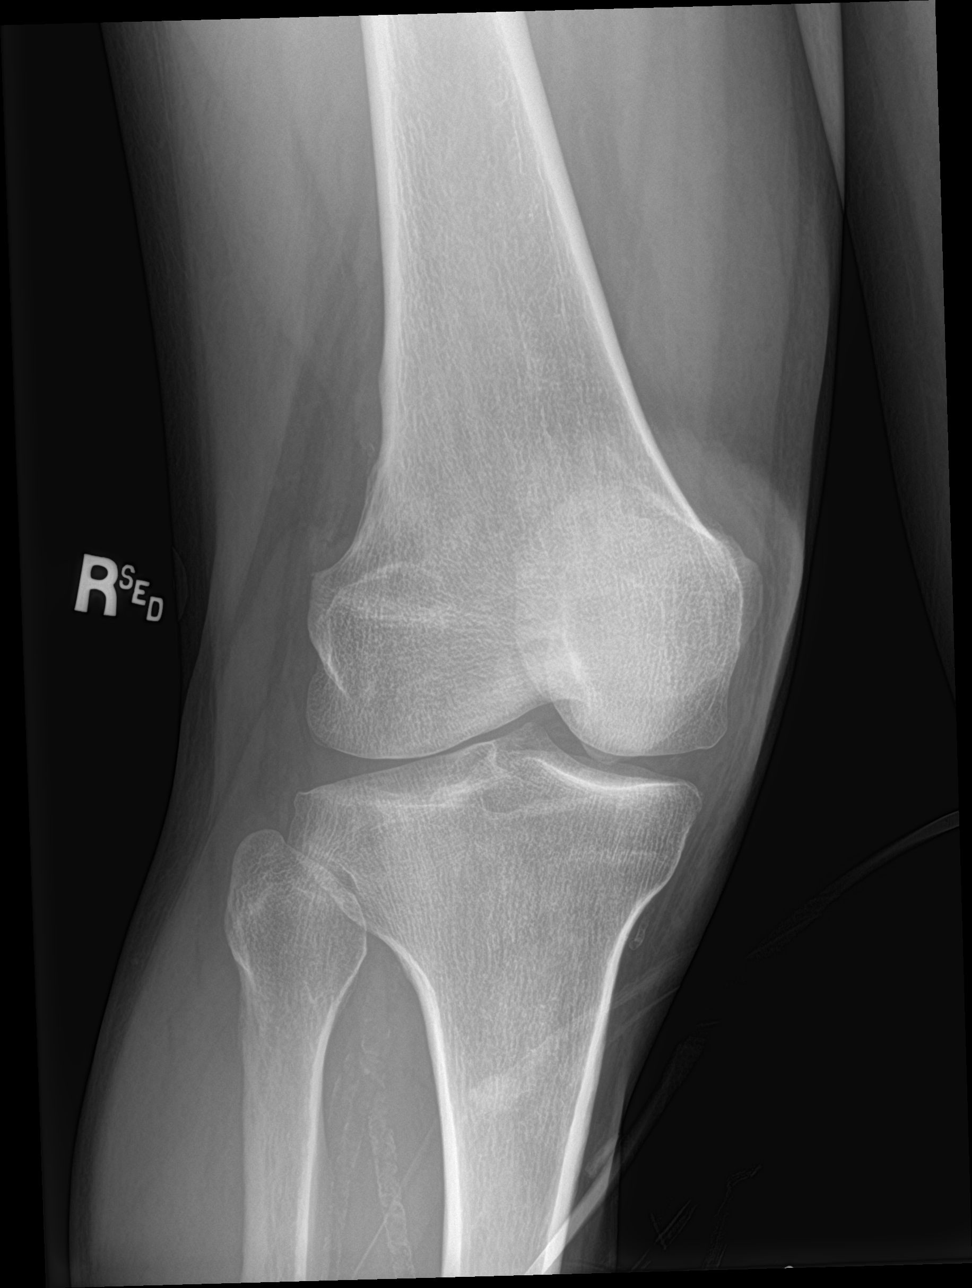

[knee obl (2 of 2)]
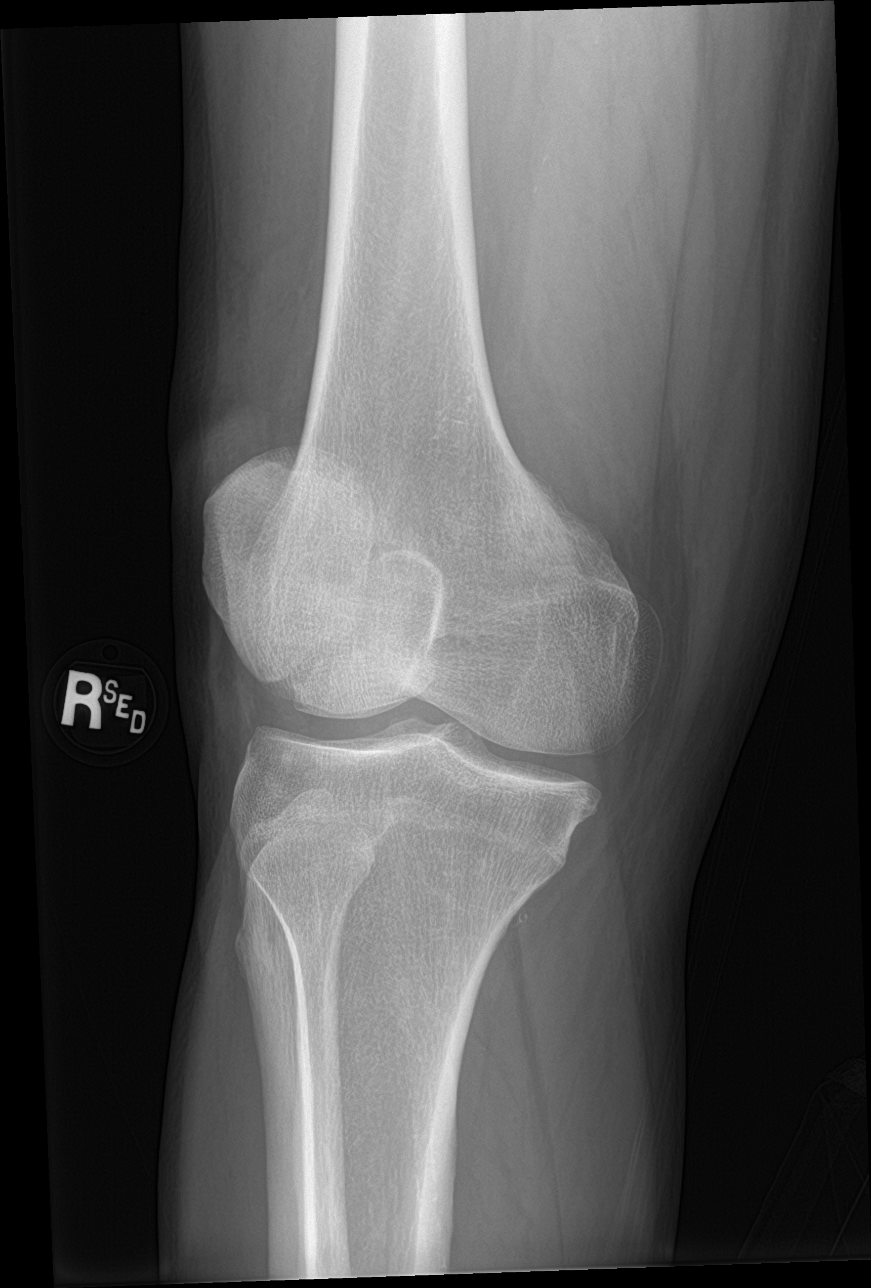

[knee lat]
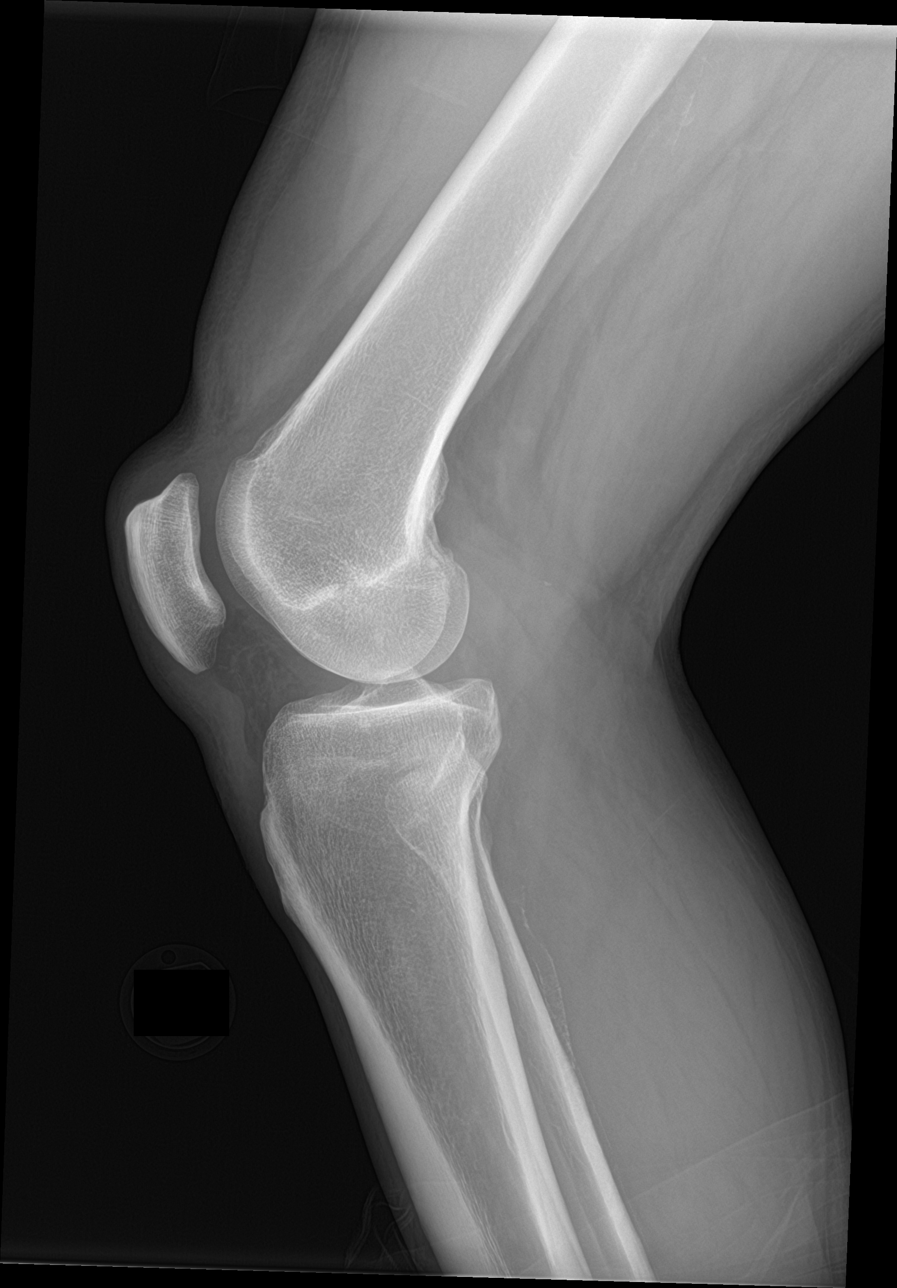

[4 of 4 positions shown; findings below may reference images not displayed]

FINDINGS: No evidence of fracture, dislocation, or joint effusion. No evidence
of arthropathy or other focal bone abnormality. Soft tissues are
unremarkable.
IMPRESSION: Negative.

## 2021-04-11 ENCOUNTER — Other Ambulatory Visit: Payer: Self-pay | Admitting: Orthopedic Surgery

## 2021-04-18 ENCOUNTER — Other Ambulatory Visit
Admission: RE | Admit: 2021-04-18 | Discharge: 2021-04-18 | Disposition: A | Discharge status: Home / Self Care | Payer: Medicare HMO | Source: Ambulatory Visit | Attending: Orthopedic Surgery | Admitting: Orthopedic Surgery

## 2021-04-18 ENCOUNTER — Other Ambulatory Visit: Payer: Self-pay

## 2021-04-18 HISTORY — DX: Other specified postprocedural states: R11.2

## 2021-04-18 HISTORY — DX: Prediabetes: R73.03

## 2021-04-18 HISTORY — DX: Other specified postprocedural states: Z98.890

## 2021-04-18 HISTORY — DX: Unspecified asthma, uncomplicated: J45.909

## 2021-04-18 NOTE — Patient Instructions (Addendum)
Your procedure is scheduled on: 05/01/21 - Monday Report to the Registration Desk on the 1st floor of the [place]. To find out your arrival time, please call [phone] between 1PM - 3PM on: 04/27/21 - Friday Report to [place] for labs/EKG on 04/20/21 at 9:45 am.  REMEMBER: Instructions that are not followed completely may result in serious medical risk, up to and including death; or upon the discretion of your surgeon and anesthesiologist your surgery may need to be rescheduled.  Do not eat food after midnight the night before surgery.  No gum chewing, lozengers or hard candies.  You may however, drink CLEAR liquids up to 2 hours before you are scheduled to arrive for your surgery. Do not drink anything within 2 hours of your scheduled arrival time.  Clear liquids include: - water  - apple juice without pulp - gatorade (not RED, PURPLE, OR BLUE) - black coffee or tea (Do NOT add milk or creamers to the coffee or tea) Do NOT drink anything that is not on this list.  Type 1 and Type 2 diabetics should only drink water.  In addition, your doctor has ordered for you to drink the provided  Ensure Pre-Surgery Clear Carbohydrate Drink  Drinking this carbohydrate drink up to two hours before surgery helps to reduce insulin resistance and improve patient outcomes. Please complete drinking 2 hours prior to scheduled arrival time.  TAKE THESE MEDICATIONS THE MORNING OF SURGERY WITH A SIP OF WATER:  - amLODipine (NORVASC) 5 MG tablet - busPIRone (BUSPAR) 10 MG tablet - pravastatin (PRAVACHOL) 40 MG tablet - vortioxetine HBr (TRINTELLIX) 10 MG TABS tablet   One week prior to surgery: Stop Anti-inflammatories (NSAIDS) such as Advil, Aleve, Ibuprofen, Motrin, Naproxen, Naprosyn and Aspirin based products such as Excedrin, Goodys Powder, BC Powder.  Stop ANY OVER THE COUNTER supplements until after surgery.  You may however, continue to take Tylenol if needed for pain up  until the day of surgery.  No Alcohol for 24 hours before or after surgery.  No Smoking including e-cigarettes for 24 hours prior to surgery.  No chewable tobacco products for at least 6 hours prior to surgery.  No nicotine patches on the day of surgery.  Do not use any "recreational" drugs for at least a week prior to your surgery.  Please be advised that the combination of cocaine and anesthesia may have negative outcomes, up to and including death. If you test positive for cocaine, your surgery will be cancelled.  On the morning of surgery brush your teeth with toothpaste and water, you may rinse your mouth with mouthwash if you wish. Do not swallow any toothpaste or mouthwash.  Use CHG Soap or wipes as directed on instruction sheet.  Do not wear jewelry, make-up, hairpins, clips or nail polish.  Do not wear lotions, powders, or perfumes.   Do not shave body from the neck down 48 hours prior to surgery just in case you cut yourself which could leave a site for infection.  Also, freshly shaved skin may become irritated if using the CHG soap.  Contact lenses, hearing aids and dentures may not be worn into surgery.  Do not bring valuables to the hospital. [facility] is not responsible for any missing/lost belongings or valuables.   Notify your doctor if there is any change in your medical condition (cold, fever, infection).  Wear comfortable clothing (specific to your surgery type) to the hospital.  After surgery, you can help prevent lung  complications by doing breathing exercises.  Take deep breaths and cough every 1-2 hours. Your doctor may order a device called an Incentive Spirometer to help you take deep breaths. When coughing or sneezing, hold a pillow firmly against your incision with both hands. This is called splinting. Doing this helps protect your incision. It also decreases belly discomfort.  If you are being admitted to the hospital overnight, leave your suitcase  in the car. After surgery it may be brought to your room.  If you are being discharged the day of surgery, you will not be allowed to drive home. You will need a responsible adult (18 years or older) to drive you home and stay with you that night.   If you are taking public transportation, you will need to have a responsible adult (18 years or older) with you. Please confirm with your physician that it is acceptable to use public transportation.   Please call the [facility] at [phone] if you have any questions about these instructions.  Surgery Visitation Policy:  Patients undergoing a surgery or procedure may have one family member or support person with them as long as that person is not COVID-19 positive or experiencing its symptoms.  That person may remain in the waiting area during the procedure and may rotate out with other people.  Inpatient Visitation:    Visiting hours are 7 a.m. to 8 p.m. Up to two visitors ages 16+ are allowed at one time in a patient room. The visitors may rotate out with other people during the day. Visitors must check out when they leave, or other visitors will not be allowed. One designated support person may remain overnight. The visitor must pass COVID-19 screenings, use hand sanitizer when entering and exiting the patients room and wear a mask at all times, including in the patients room. Patients must also wear a mask when staff or their visitor are in the room. Masking is required regardless of vaccination status.

## 2021-04-20 ENCOUNTER — Other Ambulatory Visit: Payer: Self-pay | Admitting: Orthopedic Surgery

## 2021-04-20 ENCOUNTER — Other Ambulatory Visit
Admission: RE | Admit: 2021-04-20 | Discharge: 2021-04-20 | Disposition: A | Discharge status: Home / Self Care | Payer: Medicare HMO | Source: Ambulatory Visit | Attending: Orthopedic Surgery | Admitting: Orthopedic Surgery

## 2021-04-20 ENCOUNTER — Other Ambulatory Visit: Payer: Self-pay

## 2021-04-20 DIAGNOSIS — Z01818 Encounter for other preprocedural examination: Secondary | ICD-10-CM | POA: Insufficient documentation

## 2021-04-20 DIAGNOSIS — I10 Essential (primary) hypertension: Secondary | ICD-10-CM | POA: Diagnosis not present

## 2021-04-20 DIAGNOSIS — Z01810 Encounter for preprocedural cardiovascular examination: Secondary | ICD-10-CM | POA: Diagnosis not present

## 2021-04-20 LAB — BASIC METABOLIC PANEL
Anion gap: 10 (ref 5–15)
BUN: 18 mg/dL (ref 6–20)
CO2: 26 mmol/L (ref 22–32)
Calcium: 9.6 mg/dL (ref 8.9–10.3)
Chloride: 100 mmol/L (ref 98–111)
Creatinine, Ser: 1.1 mg/dL (ref 0.61–1.24)
GFR, Estimated: >60 mL/min (ref >60)
Glucose, Bld: 159 mg/dL — ABNORMAL HIGH (ref 70–99)
Potassium: 3.4 mmol/L — ABNORMAL LOW (ref 3.5–5.1)
Sodium: 136 mmol/L (ref 135–145)

## 2021-04-20 LAB — CBC
HCT: 42.9 % (ref 39.0–52.0)
Hemoglobin: 15.6 g/dL (ref 13.0–17.0)
MCH: 34.2 pg — ABNORMAL HIGH (ref 26.0–34.0)
MCHC: 36.4 g/dL — ABNORMAL HIGH (ref 30.0–36.0)
MCV: 94.1 fL (ref 80.0–100.0)
Platelets: 224 10*3/uL (ref 150–400)
RBC: 4.56 MIL/uL (ref 4.22–5.81)
RDW: 12.1 % (ref 11.5–15.5)
WBC: 4.1 10*3/uL (ref 4.0–10.5)
nRBC: 0 % (ref 0.0–0.2)

## 2021-05-01 ENCOUNTER — Encounter
Admission: RE | Disposition: A | Discharge status: Home / Self Care | Payer: Self-pay | Source: Home / Self Care | Attending: Orthopedic Surgery

## 2021-05-01 ENCOUNTER — Encounter: Payer: Self-pay | Admitting: Orthopedic Surgery

## 2021-05-01 ENCOUNTER — Other Ambulatory Visit: Payer: Self-pay

## 2021-05-01 ENCOUNTER — Ambulatory Visit: Payer: Medicare HMO | Admitting: Anesthesiology

## 2021-05-01 ENCOUNTER — Ambulatory Visit
Admission: RE | Admit: 2021-05-01 | Discharge: 2021-05-01 | Disposition: A | Discharge status: Home / Self Care | Payer: Medicare HMO | Attending: Orthopedic Surgery | Admitting: Orthopedic Surgery

## 2021-05-01 DIAGNOSIS — S76111S Strain of right quadriceps muscle, fascia and tendon, sequela: Secondary | ICD-10-CM | POA: Diagnosis not present

## 2021-05-01 DIAGNOSIS — S83231A Complex tear of medial meniscus, current injury, right knee, initial encounter: Secondary | ICD-10-CM | POA: Insufficient documentation

## 2021-05-01 DIAGNOSIS — X58XXXA Exposure to other specified factors, initial encounter: Secondary | ICD-10-CM | POA: Diagnosis not present

## 2021-05-01 DIAGNOSIS — E785 Hyperlipidemia, unspecified: Secondary | ICD-10-CM | POA: Insufficient documentation

## 2021-05-01 DIAGNOSIS — Z7982 Long term (current) use of aspirin: Secondary | ICD-10-CM | POA: Insufficient documentation

## 2021-05-01 DIAGNOSIS — S76111A Strain of right quadriceps muscle, fascia and tendon, initial encounter: Secondary | ICD-10-CM | POA: Diagnosis not present

## 2021-05-01 DIAGNOSIS — Z79899 Other long term (current) drug therapy: Secondary | ICD-10-CM | POA: Diagnosis not present

## 2021-05-01 DIAGNOSIS — S83271A Complex tear of lateral meniscus, current injury, right knee, initial encounter: Secondary | ICD-10-CM | POA: Diagnosis not present

## 2021-05-01 DIAGNOSIS — I10 Essential (primary) hypertension: Secondary | ICD-10-CM | POA: Insufficient documentation

## 2021-05-01 HISTORY — PX: KNEE ARTHROSCOPY WITH MEDIAL MENISECTOMY: SHX5651

## 2021-05-01 HISTORY — PX: QUADRICEPS TENDON REPAIR: SHX756

## 2021-05-01 HISTORY — PX: KNEE ARTHROSCOPY WITH LATERAL MENISECTOMY: SHX6193

## 2021-05-01 SURGERY — REPAIR, TENDON, QUADRICEPS
Anesthesia: General | Site: Knee | Laterality: Right

## 2021-05-01 MED ORDER — PROPOFOL 10 MG/ML IV BOLUS
INTRAVENOUS | Status: AC
  Filled 2021-05-01: qty 20

## 2021-05-01 MED ORDER — DEXAMETHASONE SODIUM PHOSPHATE 10 MG/ML IJ SOLN
INTRAMUSCULAR | Status: DC | PRN
  Administered 2021-05-01: 4 mg via INTRAVENOUS

## 2021-05-01 MED ORDER — ACETAMINOPHEN 10 MG/ML IV SOLN
INTRAVENOUS | Status: DC | PRN
  Administered 2021-05-01: 1000 mg via INTRAVENOUS

## 2021-05-01 MED ORDER — CHLORHEXIDINE GLUCONATE 0.12 % MT SOLN
OROMUCOSAL | Status: AC
  Administered 2021-05-01: 11:00:00 15 mL via OROMUCOSAL
  Filled 2021-05-01: qty 15

## 2021-05-01 MED ORDER — OXYCODONE HCL 5 MG/5ML PO SOLN: 5.0000 mg | Freq: Once | ORAL | Status: DC | PRN

## 2021-05-01 MED ORDER — MORPHINE SULFATE (PF) 2 MG/ML IV SOLN: 0.5000 mg | INTRAVENOUS | Status: DC | PRN

## 2021-05-01 MED ORDER — HYDROCODONE-ACETAMINOPHEN 7.5-325 MG PO TABS
1.0000 | ORAL_TABLET | Freq: Four times a day (QID) | ORAL | 0 refills | Status: DC | PRN
Start: 2021-05-01 — End: 2021-06-13

## 2021-05-01 MED ORDER — CEFAZOLIN SODIUM-DEXTROSE 2-4 GM/100ML-% IV SOLN
INTRAVENOUS | Status: AC
  Filled 2021-05-01: qty 100

## 2021-05-01 MED ORDER — FENTANYL CITRATE (PF) 100 MCG/2ML IJ SOLN
INTRAMUSCULAR | Status: AC
  Filled 2021-05-01: qty 2

## 2021-05-01 MED ORDER — HYDROCODONE-ACETAMINOPHEN 5-325 MG PO TABS: 1.0000 | ORAL_TABLET | ORAL | Status: DC | PRN

## 2021-05-01 MED ORDER — HYDROCODONE-ACETAMINOPHEN 7.5-325 MG PO TABS: 1.0000 | ORAL_TABLET | ORAL | Status: DC | PRN

## 2021-05-01 MED ORDER — APREPITANT 40 MG PO CAPS
ORAL_CAPSULE | ORAL | Status: AC
  Administered 2021-05-01: 11:00:00 40 mg via ORAL
  Filled 2021-05-01: qty 1

## 2021-05-01 MED ORDER — ONDANSETRON HCL 4 MG/2ML IJ SOLN
INTRAMUSCULAR | Status: AC
  Filled 2021-05-01: qty 2

## 2021-05-01 MED ORDER — ORAL CARE MOUTH RINSE: 15.0000 mL | Freq: Once | OROMUCOSAL | Status: AC

## 2021-05-01 MED ORDER — LIDOCAINE HCL (CARDIAC) PF 100 MG/5ML IV SOSY
PREFILLED_SYRINGE | INTRAVENOUS | Status: DC | PRN
  Administered 2021-05-01: 100 mg via INTRAVENOUS

## 2021-05-01 MED ORDER — FENTANYL CITRATE (PF) 100 MCG/2ML IJ SOLN: 25.0000 ug | INTRAMUSCULAR | Status: DC | PRN

## 2021-05-01 MED ORDER — SODIUM CHLORIDE 0.9 % IR SOLN: Status: DC | PRN

## 2021-05-01 MED ORDER — CHLORHEXIDINE GLUCONATE 0.12 % MT SOLN: 15.0000 mL | Freq: Once | OROMUCOSAL | Status: AC

## 2021-05-01 MED ORDER — MIDAZOLAM HCL 2 MG/2ML IJ SOLN
INTRAMUSCULAR | Status: DC | PRN
Start: 2021-05-01 — End: 2021-05-01
  Administered 2021-05-01: 2 mg via INTRAVENOUS

## 2021-05-01 MED ORDER — FAMOTIDINE 20 MG PO TABS
ORAL_TABLET | ORAL | Status: AC
  Administered 2021-05-01: 11:00:00 20 mg via ORAL
  Filled 2021-05-01: qty 1

## 2021-05-01 MED ORDER — METOCLOPRAMIDE HCL 5 MG/ML IJ SOLN: 5.0000 mg | Freq: Three times a day (TID) | INTRAMUSCULAR | Status: DC | PRN

## 2021-05-01 MED ORDER — BUPIVACAINE-EPINEPHRINE (PF) 0.5% -1:200000 IJ SOLN
INTRAMUSCULAR | Status: AC
  Filled 2021-05-01: qty 30

## 2021-05-01 MED ORDER — LACTATED RINGERS IV SOLN: INTRAVENOUS | Status: DC

## 2021-05-01 MED ORDER — SODIUM CHLORIDE 0.9 % IV SOLN: INTRAVENOUS | Status: DC

## 2021-05-01 MED ORDER — FAMOTIDINE 20 MG PO TABS: 20.0000 mg | ORAL_TABLET | Freq: Once | ORAL | Status: AC

## 2021-05-01 MED ORDER — APREPITANT 40 MG PO CAPS: 40.0000 mg | ORAL_CAPSULE | Freq: Once | ORAL | Status: AC

## 2021-05-01 MED ORDER — ONDANSETRON HCL 4 MG/2ML IJ SOLN: 4.0000 mg | Freq: Four times a day (QID) | INTRAMUSCULAR | Status: DC | PRN

## 2021-05-01 MED ORDER — METOCLOPRAMIDE HCL 10 MG PO TABS: 5.0000 mg | ORAL_TABLET | Freq: Three times a day (TID) | ORAL | Status: DC | PRN

## 2021-05-01 MED ORDER — FENTANYL CITRATE (PF) 100 MCG/2ML IJ SOLN
INTRAMUSCULAR | Status: DC | PRN
  Administered 2021-05-01 (×2): 50 ug via INTRAVENOUS

## 2021-05-01 MED ORDER — DEXAMETHASONE SODIUM PHOSPHATE 10 MG/ML IJ SOLN
INTRAMUSCULAR | Status: AC
  Filled 2021-05-01: qty 1

## 2021-05-01 MED ORDER — CEFAZOLIN SODIUM-DEXTROSE 2-4 GM/100ML-% IV SOLN
2.0000 g | INTRAVENOUS | Status: AC
  Administered 2021-05-01: 13:00:00 2 g via INTRAVENOUS

## 2021-05-01 MED ORDER — ACETAMINOPHEN 10 MG/ML IV SOLN
INTRAVENOUS | Status: AC
  Filled 2021-05-01: qty 100

## 2021-05-01 MED ORDER — ONDANSETRON HCL 4 MG PO TABS: 4.0000 mg | ORAL_TABLET | Freq: Four times a day (QID) | ORAL | Status: DC | PRN

## 2021-05-01 MED ORDER — OXYCODONE HCL 5 MG PO TABS: 5.0000 mg | ORAL_TABLET | Freq: Once | ORAL | Status: DC | PRN

## 2021-05-01 MED ORDER — NEOMYCIN-POLYMYXIN B GU 40-200000 IR SOLN
Status: AC
  Filled 2021-05-01: qty 4

## 2021-05-01 MED ORDER — ONDANSETRON HCL 4 MG/2ML IJ SOLN
INTRAMUSCULAR | Status: DC | PRN
Start: 2021-05-01 — End: 2021-05-01
  Administered 2021-05-01: 4 mg via INTRAVENOUS

## 2021-05-01 MED ORDER — BUPIVACAINE-EPINEPHRINE (PF) 0.5% -1:200000 IJ SOLN
INTRAMUSCULAR | Status: DC | PRN
  Administered 2021-05-01: 30 mL

## 2021-05-01 MED ORDER — MIDAZOLAM HCL 2 MG/2ML IJ SOLN
INTRAMUSCULAR | Status: AC
  Filled 2021-05-01: qty 2

## 2021-05-01 MED ORDER — LACTATED RINGERS IR SOLN
Status: DC | PRN
  Administered 2021-05-01 (×2): 3000 mL

## 2021-05-01 MED ORDER — PROPOFOL 10 MG/ML IV BOLUS
INTRAVENOUS | Status: DC | PRN
Start: 2021-05-01 — End: 2021-05-01
  Administered 2021-05-01: 30 mg via INTRAVENOUS
  Administered 2021-05-01: 200 mg via INTRAVENOUS

## 2021-05-01 SURGICAL SUPPLY — 50 items
ANCH SUT 2 5 PRELD ROTR CUF (Anchor) ×2 IMPLANT
ANCHOR SUT 5.0 TWINFIX 2 ULBRD (Anchor) ×4 IMPLANT
APL PRP STRL LF DISP 70% ISPRP (MISCELLANEOUS) ×1
BLADE INCISOR PLUS 4.5 (BLADE) ×2 IMPLANT
BNDG CMPR STD VLCR NS LF 5.8X6 (GAUZE/BANDAGES/DRESSINGS) ×1
BNDG ELASTIC 4X5.8 VLCR STR LF (GAUZE/BANDAGES/DRESSINGS) IMPLANT
BNDG ELASTIC 6X5.8 VLCR NS LF (GAUZE/BANDAGES/DRESSINGS) ×2 IMPLANT
BRACE KNEE POST OP SHORT (BRACE) ×2 IMPLANT
CHLORAPREP W/TINT 26 (MISCELLANEOUS) ×3 IMPLANT
CUFF TOURN SGL QUICK 24 (TOURNIQUET CUFF)
CUFF TOURN SGL QUICK 34 (TOURNIQUET CUFF)
CUFF TRNQT CYL 24X4X16.5-23 (TOURNIQUET CUFF) IMPLANT
CUFF TRNQT CYL 34X4.125X (TOURNIQUET CUFF) IMPLANT
DRAPE ARTHRO LIMB 89X125 STRL (DRAPES) ×3 IMPLANT
DRAPE C-ARMOR (DRAPES) IMPLANT
ELECT REM PT RETURN 9FT ADLT (ELECTROSURGICAL) ×3
ELECTRODE REM PT RTRN 9FT ADLT (ELECTROSURGICAL) ×1 IMPLANT
GAUZE SPONGE 4X4 12PLY STRL (GAUZE/BANDAGES/DRESSINGS) ×3 IMPLANT
GAUZE XEROFORM 1X8 LF (GAUZE/BANDAGES/DRESSINGS) ×6 IMPLANT
GLOVE SURG SYN 9.0  PF PI (GLOVE) ×2
GLOVE SURG SYN 9.0 PF PI (GLOVE) ×1 IMPLANT
GOWN SRG 2XL LVL 4 RGLN SLV (GOWNS) ×1 IMPLANT
GOWN STRL NON-REIN 2XL LVL4 (GOWNS) ×3
GOWN STRL REUS W/ TWL LRG LVL3 (GOWN DISPOSABLE) ×2 IMPLANT
GOWN STRL REUS W/TWL LRG LVL3 (GOWN DISPOSABLE) ×6
IV LACTATED RINGER IRRG 3000ML (IV SOLUTION) ×6
IV LR IRRIG 3000ML ARTHROMATIC (IV SOLUTION) ×2 IMPLANT
KIT TURNOVER KIT A (KITS) ×3 IMPLANT
MANIFOLD NEPTUNE II (INSTRUMENTS) ×6 IMPLANT
NDL MAYO 6 CRC TAPER PT (NEEDLE) IMPLANT
NEEDLE HYPO 22GX1.5 SAFETY (NEEDLE) ×3 IMPLANT
NEEDLE MAYO 6 CRC TAPER PT (NEEDLE) ×3 IMPLANT
NS IRRIG 1000ML POUR BTL (IV SOLUTION) ×3 IMPLANT
PACK ARTHROSCOPY KNEE (MISCELLANEOUS) ×3 IMPLANT
PACK TOTAL KNEE (MISCELLANEOUS) ×3 IMPLANT
PAD ABD DERMACEA PRESS 5X9 (GAUZE/BANDAGES/DRESSINGS) ×6 IMPLANT
PASSER SUT SWANSON 36MM LOOP (INSTRUMENTS) ×1 IMPLANT
SCALPEL PROTECTED #10 DISP (BLADE) ×6 IMPLANT
SCALPEL PROTECTED #11 DISP (BLADE) ×3 IMPLANT
SPONGE T-LAP 18X18 ~~LOC~~+RFID (SPONGE) ×3 IMPLANT
STAPLER SKIN PROX 35W (STAPLE) ×3 IMPLANT
SUT CHROMIC 1-0 (SUTURE) ×2 IMPLANT
SUT ETHILON 4-0 (SUTURE) ×3
SUT ETHILON 4-0 FS2 18XMFL BLK (SUTURE) ×1
SUT VIC AB 1 CT1 36 (SUTURE) ×2 IMPLANT
SUTURE ETHLN 4-0 FS2 18XMF BLK (SUTURE) ×1 IMPLANT
TUBING INFLOW SET DBFLO PUMP (TUBING) ×3 IMPLANT
TUBING OUTFLOW SET DBLFO PUMP (TUBING) ×3 IMPLANT
WAND COBLATION FLOW 50 (SURGICAL WAND) ×2 IMPLANT
WATER STERILE IRR 500ML POUR (IV SOLUTION) ×3 IMPLANT

## 2021-05-01 NOTE — Op Note (Signed)
05/01/2021  2:03 PM  PATIENT:  Jorge Jordan  61 y.o. male  PRE-OPERATIVE DIAGNOSIS:  Rupture of right quadriceps tendon, sequela,COMPLEX TEAR OF MEDIAL MENISCAL OF RIGHT KNEE,COMPLEX TEAR OF LATERAL MENISCAL RIGHT KNEE  POST-OPERATIVE DIAGNOSIS:  Rupture of right quadriceps tendon, sequela,COMPLEX TEAR OF MEDIAL MENISCAL OF RIGHT KNEE,COMPLEX TEAR OF LATERAL MENISCAL RIGHT KNEE  PROCEDURE:  Procedure(s): REPAIR QUADRICEP TENDON (Right) KNEE ARTHROSCOPY WITH MEDIAL MENISECTOMY (Right) KNEE ARTHROSCOPY WITH LATERAL MENISECTOMY (Right)  SURGEON: Laurene Footman, MD  ASSISTANTS: none  ANESTHESIA:   general  EBL:  Total I/O In: 700 [I.V.:600; IV Piggyback:100] Out: 50 [Blood:50]  BLOOD ADMINISTERED:none  DRAINS: none   LOCAL MEDICATIONS USED:  MARCAINE     SPECIMEN:  No Specimen  DISPOSITION OF SPECIMEN:  N/A  COUNTS:  YES  TOURNIQUET:  * No tourniquets in log *  IMPLANTS: [employer] anchors times two 5.0 titanium  DICTATION: .Dragon Dictation patient was brought the operating room and after adequate anesthesia was obtained the right leg was prepped and draped in the usual sterile fashion.  After patient identification and timeout procedures inferior lateral portals made the arthroscope was introduced.  Inspection revealed rupture of the quadriceps tendon within the joint with some of the prior sutures visible there is mild patellofemoral chondromalacia there were no loose bodies in the gutters going to the medial compartment inferior medial portal was made and a flap tear of the posterior horn of the meniscus was identified and ablated with the ArthroCare wand the ACL is intact and there was a tear of the posterior horn of the lateral meniscus near the root but not including the root this is also ablated with the wand to stable margins there is mild chondromalacia medial lateral kart compartments at this point the arthroscope was removed and the prior surgical incision was  opened from the prior quadriceps repair.  This was exposed and the quadriceps tendon was noted to have sutures remaining that were still attached and not completely disrupted so the quad tendon was able to be brought down to the patella prior sutures were removed and the patella debrided at the proximal end with 2 drill holes made in 2 of the 5.0 titanium corkscrew anchors placed with 2 sutures on each the end of the tendon was debrided and then the sutures were woven through 4 of 4 of these with a locking suture bringing it down to the bone and holding the tendon down with the for suture so there was tendon to bone apposition.  The remaining sutures were placed to get a good repair of the tendon following this on with #1 chromic was used along the retinaculum medial to lateral to get a watertight seal.  The arthroscope was then introduced again and it showed good repair the wound was irrigated and all instrumentation withdrawn the wound closed with 2-0 Vicryl subcutaneously and skin staples with 30 cc of half percent Sensorcaine with epinephrine injected into all incisions.  The wound was dressed with Xeroform 4 x 4 web roll ABDs Ace wrap and hinged knee brace locked in extension  PLAN OF CARE: Discharge to home after PACU  PATIENT DISPOSITION:  PACU - hemodynamically stable.

## 2021-05-01 NOTE — Transfer of Care (Signed)
Immediate Anesthesia Transfer of Care Note  Patient: Jorge Jordan  Procedure(s) Performed: REPAIR QUADRICEP TENDON (Right: Knee) KNEE ARTHROSCOPY WITH MEDIAL MENISECTOMY (Right: Knee) KNEE ARTHROSCOPY WITH LATERAL MENISECTOMY (Right: Knee)  Patient Location: PACU  Anesthesia Type:General  Level of Consciousness: awake, alert  and oriented  Airway & Oxygen Therapy: Patient Spontanous Breathing  Post-op Assessment: Report given to RN  Post vital signs: Reviewed and stable  Last Vitals:  Vitals Value Taken Time  BP    Temp    Pulse 101 05/01/21 1355  Resp    SpO2 96 % 05/01/21 1355  Vitals shown include unvalidated device data.  Last Pain:  Vitals:   05/01/21 1039  TempSrc: Temporal  PainSc: 0-No pain         Complications: No notable events documented.

## 2021-05-01 NOTE — Anesthesia Preprocedure Evaluation (Signed)
Anesthesia Evaluation  Patient identified by MRN, date of birth, ID band Patient awake    Reviewed: Allergy & Precautions, NPO status , Patient's Chart, lab work & pertinent test results  History of Anesthesia Complications (+) PONV and history of anesthetic complications  Airway Mallampati: III  TM Distance: <3 FB Neck ROM: full    Dental  (+) Chipped   Pulmonary asthma , neg recent URI,    Pulmonary exam normal        Cardiovascular Exercise Tolerance: Good hypertension, (-) angina(-) Past MI Normal cardiovascular exam     Neuro/Psych PSYCHIATRIC DISORDERS  Neuromuscular disease    GI/Hepatic negative GI ROS, Neg liver ROS, neg GERD  ,  Endo/Other  negative endocrine ROS  Renal/GU      Musculoskeletal  (+) Arthritis ,   Abdominal   Peds  Hematology negative hematology ROS (+)   Anesthesia Other Findings Past Medical History: No date: Allergy No date: Anxiety No date: Arthritis No date: Asthma No date: Foot deformity     Comment:  foot arthrodesis L foot No date: Hyperlipidemia No date: Hypertension No date: Osteoporosis No date: PONV (postoperative nausea and vomiting) No date: Pre-diabetes No date: Shoulder bursitis     Comment:  both  No date: Tendinitis     Comment:  elbow  Past Surgical History: 11/22/2020: COLONOSCOPY; N/A     Comment:  Procedure: COLONOSCOPY;  Surgeon: Virgel Manifold,               MD;  Location: [facility] ENDOSCOPY;  Service: Endoscopy;                Laterality: N/A; 2001: FOOT SURGERY; Left     Comment:  Triple arthrodesis 07/14/2020: QUADRICEPS TENDON REPAIR; Right     Comment:  Procedure: REPAIR QUADRICEP TENDON;  Surgeon: Hessie Knows, MD;  Location: [facility] ORS;  Service: Orthopedics;               Laterality: Right; 05/04/2020: REPAIR QUADRICEPS/HAMSTRING MUSCLES; Right     Comment:  Procedure: REPAIR QUADRICEPS/HAMSTRING MUSCLES;                 Surgeon: Hessie Knows, MD;  Location: [facility] ORS;                Service: Orthopedics;  Laterality: Right;  RIGHT               QUADRICEPS No date: ROTATOR CUFF REPAIR; Left No date: SHOULDER FUSION SURGERY; Left No date: WRIST SURGERY     Comment:  ganglion cyst  BMI    Body Mass Index: 32.23 kg/m      Reproductive/Obstetrics negative OB ROS                             Anesthesia Physical Anesthesia Plan  ASA: 3  Anesthesia Plan: General LMA   Post-op Pain Management:    Induction: Intravenous  PONV Risk Score and Plan: Dexamethasone, Ondansetron, Midazolam and Treatment may vary due to age or medical condition  Airway Management Planned: LMA  Additional Equipment:   Intra-op Plan:   Post-operative Plan: Extubation in OR  Informed Consent: I have reviewed the patients History and Physical, chart, labs and discussed the procedure including the risks, benefits and alternatives for the proposed anesthesia with the patient or authorized representative who has indicated his/her understanding and acceptance.  Dental Advisory Given  Plan Discussed with: Anesthesiologist, CRNA and Surgeon  Anesthesia Plan Comments: (Patient consented for risks of anesthesia including but not limited to:  - adverse reactions to medications - damage to eyes, teeth, lips or other oral mucosa - nerve damage due to positioning  - sore throat or hoarseness - Damage to heart, brain, nerves, lungs, other parts of body or loss of life  Patient voiced understanding.)        Anesthesia Quick Evaluation

## 2021-05-01 NOTE — Anesthesia Procedure Notes (Addendum)
Procedure Name: LMA Insertion Date/Time: 05/01/2021 12:32 PM Performed by: Lesle Reek, CRNA Pre-anesthesia Checklist: Patient identified, Patient being monitored, Timeout performed, Emergency Drugs available and Suction available Patient Re-evaluated:Patient Re-evaluated prior to induction Oxygen Delivery Method: Circle system utilized Preoxygenation: Pre-oxygenation with 100% oxygen Induction Type: IV induction Ventilation: Mask ventilation without difficulty LMA: LMA inserted LMA Size: 5.0 Tube type: Oral Number of attempts: 1 Placement Confirmation: positive ETCO2, breath sounds checked- equal and bilateral and CO2 detector Tube secured with: Tape Dental Injury: Teeth and Oropharynx as per pre-operative assessment

## 2021-05-01 NOTE — H&P (Signed)
Chief Complaint  Patient presents with   Pre-op Exam  Right Quadricept tendon repair scheduled 05/01/21 with Dr. Rudene Jordan    History of the Present Illness: Jorge Jordan is a 61 y.o. male here today for history and physical for right knee quadricep tendon repair with arthroscopy for medial and lateral meniscectomy. Date of surgery scheduled for 05/01/2021 with Dr. Hessie Jordan. Patient had a right knee quadricep tendon repair last year, there was some failure along the central aspect of the repair. He still has some fibers of the quad tendon laterally that is allowing him to perform active extension. MRI also showed medial and lateral meniscus tears. Patient does not really have much pain in the distal quad region. Occasionally feels as if his knee wants to give way. Has some mild catching and locking bilaterally. He has to wear a knee brace for his knee to feel stable.  The patient would like to hunting this season and have surgery following this.   The patient is disabled.  I have reviewed past medical, surgical, social and family history, and allergies as documented in the EMR.  Past Medical History: Past Medical History:  Diagnosis Date   Hyperlipidemia   Hypertension   Past Surgical History: Past Surgical History:  Procedure Laterality Date   nasal surgery 1981  for deviated septum   ankle surgery Left 2000   Quadriceps tendon repair Right 05/04/2020  Jorge Jordan   EXCISION GANGLION CYST WRIST PRIMARY Right 1990s   Past Family History: Family History  Problem Relation Age of Onset   Coronary Artery Disease (Blocked arteries around heart) Mother 40  Stent   Skin cancer Father   High blood pressure (Hypertension) Brother   Medications: Current Outpatient Medications Ordered in Epic  Medication Sig Dispense Refill   amLODIPine (NORVASC) 5 MG tablet Take 5 mg by mouth once daily   aspirin 81 MG EC tablet Take 81 mg by mouth once daily   busPIRone (BUSPAR) 10 MG tablet Take 10 mg by  mouth 3 (three) times daily   lisinopriL-hydrochlorothiazide (ZESTORETIC) 20-25 mg tablet Take 1 tablet by mouth once daily   LORazepam (ATIVAN) 2 MG tablet as needed   pravastatin (PRAVACHOL) 40 MG tablet Take 40 mg by mouth once daily   predniSONE (DELTASONE) 10 MG tablet Take 1 tablet (10 mg total) by mouth once daily 6 day taper, 6,5,4,3,2,1. 21 tablet 0   sildenafil (REVATIO) 20 mg tablet Take 3-5 tablets by mouth as needed   sulfamethoxazole-trimethoprim (BACTRIM DS) 800-160 mg tablet Take 1 tablet (160 mg of trimethoprim total) by mouth 2 (two) times daily. 20 tablet 0   traZODone (DESYREL) 100 MG tablet Take 1 tablet by mouth nightly   vortioxetine (TRINTELLIX) 20 mg tablet Take 1 tablet by mouth once daily   No current Epic-ordered facility-administered medications on file.   Allergies: No Known Allergies   Body mass index is 32.54 kg/m.  Review of Systems: A comprehensive 14 point ROS was performed, reviewed, and the pertinent orthopaedic findings are documented in the HPI.  Vitals:  04/20/21 1030  BP: 122/80    General Physical Examination:   General:  Well developed, well nourished, no apparent distress, normal affect, slow antalgic gait.  HEENT: Head normocephalic, atraumatic, PERRL.   Abdomen: Soft, non tender, non distended, Bowel sounds present.  Heart: Examination of the heart reveals regular, rate, and rhythm. There is no murmur noted on ascultation. There is a normal apical pulse.  Lungs: Lungs are clear to auscultation.  There is no wheeze, rhonchi, or crackles. There is normal expansion of bilateral chest walls.   Right knee: No warmth erythema effusion or edema throughout the right lower extremity. He is able to actively straight leg raise the knee but has a 10 degree flexion contracture. He has palpable and visible defect in the medial distal quad tendon. Patella is tracking well. Patellar tendon is intact with no defect. He has some mechanical pain  with McMurray's test. Knee is stable to valgus varus stress testing.  Radiographs:  COMPARISON:  None.    INDICATION: Strain of right quadriceps muscle, fascia and tendon, initial encounter.  TECHNIQUE:MRI KNEE RIGHT WO IV CONTRAST-- right Knee; without contrast.   FINDINGS:   INTERNAL STRUCTURES:  - Medial meniscus: Complex tear of the posterior horn and body of the medial meniscus with apparent small meniscal flap displaced medially.  - Lateral meniscus: Lateral meniscus demonstrates increased signal intensity within the posterior horn and body with fraying along the free edge.  - Anterior cruciate ligament:  Mild increased signal intensity present of the ACL.  - Posterior cruciate ligament: Increased signal of the mid to distal PCL.  - Medial collateral ligament:  Mild increased signal the proximal MCL.  - Lateral collateral ligamentous complex: Thickening and increased signal of the proximal fibulocollateral ligament.   BONES/JOINTS/CARTILAGE  - No acute fractures.  - Inferior displacement of the patella.  - Multiple suture anchors are present within the patella.  - Small tricompartment osteophytes. Patchy areas of cartilage thinning and fibrillation of the patellofemoral joint.  - Small knee joint effusion with synovial proliferation and thickening especially in the medial patellofemoral region.   EXTENSOR MECHANISM:  - There is a full-thickness tear of the distal quadriceps tendon with with the majority of the tendon retracted approximately 3 cm medially and 3.5 cm laterally with residual thin areas of previous repair tissue/scarring extending along the distal lateral aspect with areas of susceptibility artifact. Thickening and increased signal intensity of the patellar tendon.   SOFT TISSUES:  - Apparent scarring and postsurgical changes are present anterior to the distal quadriceps and patella.  Assessment: ICD-10-CM  1. Rupture of right quadriceps tendon, sequela S76.111S    Plan:  56. 61 year old male with prior right knee quad tendon repair with failure. He still has some lateral fibers that are intact but centrally has a large defect with some weakness. He also found to have medial and lateral meniscus tears. Having some mild mechanical symptoms. No effusion. Knee feels weak and unstable. Risks, benefits, complications of a right knee quadricep tendon repair with arthroscopy for medial and lateral meniscectomy have been discussed with the patient. Patient has agreed and consented for the procedure with Dr. Hessie Jordan on 05/01/2021.     Electronically signed by Feliberto Gottron, PA at 04/20/2021 11:02 AM EST  Reviewed  H+P. No changes noted.

## 2021-05-01 NOTE — Discharge Instructions (Addendum)
Keep leg in immobilizer at all times Do not bend knee Weightbearing as tolerated with leg brace on Pain medicine as directed Call office if having problems   AMBULATORY SURGERY  DISCHARGE INSTRUCTIONS   The drugs that you were given will stay in your system until tomorrow so for the next 24 hours you should not:  Drive an automobile Make any legal decisions Drink any alcoholic beverage   You may resume regular meals tomorrow.  Today it is better to start with liquids and gradually work up to solid foods.  You may eat anything you prefer, but it is better to start with liquids, then soup and crackers, and gradually work up to solid foods.   Please notify your doctor immediately if you have any unusual bleeding, trouble breathing, redness and pain at the surgery site, drainage, fever, or pain not relieved by medication.    Additional Instructions:    Please contact your physician with any problems or Same Day Surgery at [phone], Monday through Friday 6 am to 4 pm, or [place] [facility] number at [phone].

## 2021-05-02 ENCOUNTER — Encounter: Payer: Self-pay | Admitting: Orthopedic Surgery

## 2021-05-02 NOTE — Anesthesia Postprocedure Evaluation (Signed)
Anesthesia Post Note  Patient: [name]  Procedure(s) Performed: REPAIR QUADRICEP TENDON (Right: Knee) KNEE ARTHROSCOPY WITH MEDIAL MENISECTOMY (Right: Knee) KNEE ARTHROSCOPY WITH LATERAL MENISECTOMY (Right: Knee)  Patient location during evaluation: PACU Anesthesia Type: General Level of consciousness: awake and alert Pain management: pain level controlled Vital Signs Assessment: post-procedure vital signs reviewed and stable Respiratory status: spontaneous breathing, nonlabored ventilation, respiratory function stable and patient connected to nasal cannula oxygen Cardiovascular status: blood pressure returned to baseline and stable Postop Assessment: no apparent nausea or vomiting Anesthetic complications: no   No notable events documented.   Last Vitals:  Vitals:   05/01/21 1422 05/01/21 1511  BP: (!) 143/88 130/86  Pulse: 90 90  Resp: 16 16  Temp: 36.6 C (!) 36.4 C  SpO2: 97% 100%    Last Pain:  Vitals:   05/01/21 1422  TempSrc: Temporal  PainSc: 3                  Precious Haws [place] Haub

## 2021-06-13 ENCOUNTER — Other Ambulatory Visit: Payer: Self-pay | Admitting: Family Medicine

## 2021-06-15 ENCOUNTER — Telehealth: Payer: Self-pay | Admitting: Family Medicine

## 2021-06-15 NOTE — Telephone Encounter (Signed)
Copied from [place] [id number]. Topic: General - Other ?>> Jun 15, 2021 11:59 AM Leward Quan A wrote: ?Reason for CRM: Patient called in to inform Dr Raliegh Ip that he was told by a rep with  [employer] that Dr Raliegh Ip portion of the application need to be completed and faxed also they cannot accept the information sent by email. This is in reference to his  vortioxetine HBr (TRINTELLIX) 10 MG TABS tablet. Any questions please call patient at  Ph# [phone] ?

## 2021-06-20 NOTE — Telephone Encounter (Signed)
Received notification that the application has been approved.  ?

## 2021-07-17 DIAGNOSIS — R29898 Other symptoms and signs involving the musculoskeletal system: Secondary | ICD-10-CM | POA: Diagnosis not present

## 2021-07-17 DIAGNOSIS — R269 Unspecified abnormalities of gait and mobility: Secondary | ICD-10-CM | POA: Diagnosis not present

## 2021-07-17 DIAGNOSIS — S76111S Strain of right quadriceps muscle, fascia and tendon, sequela: Secondary | ICD-10-CM | POA: Diagnosis not present

## 2021-07-17 DIAGNOSIS — M25661 Stiffness of right knee, not elsewhere classified: Secondary | ICD-10-CM | POA: Diagnosis not present

## 2021-07-20 DIAGNOSIS — X32XXXA Exposure to sunlight, initial encounter: Secondary | ICD-10-CM | POA: Diagnosis not present

## 2021-07-20 DIAGNOSIS — D2262 Melanocytic nevi of left upper limb, including shoulder: Secondary | ICD-10-CM | POA: Diagnosis not present

## 2021-07-20 DIAGNOSIS — M25661 Stiffness of right knee, not elsewhere classified: Secondary | ICD-10-CM | POA: Diagnosis not present

## 2021-07-20 DIAGNOSIS — L570 Actinic keratosis: Secondary | ICD-10-CM | POA: Diagnosis not present

## 2021-07-20 DIAGNOSIS — Z85828 Personal history of other malignant neoplasm of skin: Secondary | ICD-10-CM | POA: Diagnosis not present

## 2021-07-20 DIAGNOSIS — D2272 Melanocytic nevi of left lower limb, including hip: Secondary | ICD-10-CM | POA: Diagnosis not present

## 2021-07-20 DIAGNOSIS — D2261 Melanocytic nevi of right upper limb, including shoulder: Secondary | ICD-10-CM | POA: Diagnosis not present

## 2021-07-20 DIAGNOSIS — S76111S Strain of right quadriceps muscle, fascia and tendon, sequela: Secondary | ICD-10-CM | POA: Diagnosis not present

## 2021-07-24 DIAGNOSIS — M25661 Stiffness of right knee, not elsewhere classified: Secondary | ICD-10-CM | POA: Diagnosis not present

## 2021-07-24 DIAGNOSIS — S76111S Strain of right quadriceps muscle, fascia and tendon, sequela: Secondary | ICD-10-CM | POA: Diagnosis not present

## 2021-07-27 DIAGNOSIS — S76111S Strain of right quadriceps muscle, fascia and tendon, sequela: Secondary | ICD-10-CM | POA: Diagnosis not present

## 2021-07-27 DIAGNOSIS — M25611 Stiffness of right shoulder, not elsewhere classified: Secondary | ICD-10-CM | POA: Diagnosis not present

## 2021-07-31 DIAGNOSIS — S76111S Strain of right quadriceps muscle, fascia and tendon, sequela: Secondary | ICD-10-CM | POA: Diagnosis not present

## 2021-07-31 DIAGNOSIS — R29898 Other symptoms and signs involving the musculoskeletal system: Secondary | ICD-10-CM | POA: Diagnosis not present

## 2021-08-03 DIAGNOSIS — S76111S Strain of right quadriceps muscle, fascia and tendon, sequela: Secondary | ICD-10-CM | POA: Diagnosis not present

## 2021-08-03 DIAGNOSIS — M25661 Stiffness of right knee, not elsewhere classified: Secondary | ICD-10-CM | POA: Diagnosis not present

## 2021-08-07 DIAGNOSIS — S76111S Strain of right quadriceps muscle, fascia and tendon, sequela: Secondary | ICD-10-CM | POA: Diagnosis not present

## 2021-08-07 DIAGNOSIS — M25661 Stiffness of right knee, not elsewhere classified: Secondary | ICD-10-CM | POA: Diagnosis not present

## 2021-08-10 DIAGNOSIS — M25661 Stiffness of right knee, not elsewhere classified: Secondary | ICD-10-CM | POA: Diagnosis not present

## 2021-08-10 DIAGNOSIS — S76111S Strain of right quadriceps muscle, fascia and tendon, sequela: Secondary | ICD-10-CM | POA: Diagnosis not present

## 2021-08-14 DIAGNOSIS — S76111S Strain of right quadriceps muscle, fascia and tendon, sequela: Secondary | ICD-10-CM | POA: Diagnosis not present

## 2021-08-14 DIAGNOSIS — M25661 Stiffness of right knee, not elsewhere classified: Secondary | ICD-10-CM | POA: Diagnosis not present

## 2021-08-21 DIAGNOSIS — M25661 Stiffness of right knee, not elsewhere classified: Secondary | ICD-10-CM | POA: Diagnosis not present

## 2021-08-21 DIAGNOSIS — S76111S Strain of right quadriceps muscle, fascia and tendon, sequela: Secondary | ICD-10-CM | POA: Diagnosis not present

## 2021-08-28 DIAGNOSIS — S76111S Strain of right quadriceps muscle, fascia and tendon, sequela: Secondary | ICD-10-CM | POA: Diagnosis not present

## 2021-08-28 DIAGNOSIS — M25661 Stiffness of right knee, not elsewhere classified: Secondary | ICD-10-CM | POA: Diagnosis not present

## 2021-09-05 DIAGNOSIS — M25661 Stiffness of right knee, not elsewhere classified: Secondary | ICD-10-CM | POA: Diagnosis not present

## 2021-09-05 DIAGNOSIS — S76111S Strain of right quadriceps muscle, fascia and tendon, sequela: Secondary | ICD-10-CM | POA: Diagnosis not present

## 2021-09-06 ENCOUNTER — Ambulatory Visit (INDEPENDENT_AMBULATORY_CARE_PROVIDER_SITE_OTHER): Payer: Medicare HMO

## 2021-09-06 DIAGNOSIS — Z0000 Encounter for general adult medical examination without abnormal findings: Secondary | ICD-10-CM | POA: Diagnosis not present

## 2021-09-06 NOTE — Progress Notes (Addendum)
Subjective:   Jorge Jordan is a 61 y.o. male who presents for Medicare Annual/Subsequent preventive examination.  I connected with  Jorge Jordan on 09/06/21 by a audio enabled telemedicine application and verified that I am speaking with the correct person using two identifiers.  Patient Location: Home  Provider Location: Office/Clinic   I discussed the limitations of evaluation and management by telemedicine. The patient expressed understanding and agreed to proceed.  Review of Systems    Per HPI unless specifically indicated below         Objective:     Wt Readings from Last 3 Encounters:  05/01/21 212 lb (96.2 kg)  11/22/20 203 lb (92.1 kg)  11/08/20 206 lb 3.2 oz (93.5 kg)   Temp Readings from Last 3 Encounters:  05/01/21 (!) 97.5 F (36.4 C)  11/22/20 (!) 96.8 F (36 C) (Temporal)  07/14/20 98.8 F (37.1 C) (Temporal)   BP Readings from Last 3 Encounters:  05/01/21 130/86  11/22/20 110/78  11/08/20 116/73   Pulse Readings from Last 3 Encounters:  05/01/21 90  11/22/20 78  11/08/20 96     There were no vitals filed for this visit. There is no height or weight on file to calculate BMI.     05/01/2021   10:40 AM 04/18/2021    8:18 AM 11/22/2020    7:15 AM 09/05/2020    2:01 PM 07/14/2020   10:56 AM 05/04/2020    9:46 AM 05/03/2020    6:00 PM  Advanced Directives  Does Patient Have a Medical Advance Directive? No No No Yes No No No  Type of Scientist, research (medical);Living will     Copy of [place] in Chart?    No - copy requested     Would patient like information on creating a medical advance directive?     No - Patient declined No - Patient declined     Current Medications (verified) Outpatient Encounter Medications as of 09/06/2021  Medication Sig   acetaminophen (TYLENOL) 500 MG tablet Take 1,000 mg by mouth every 6 (six) hours as needed for moderate pain.   amLODipine (NORVASC) 5 MG tablet Take 1  tablet (5 mg total) by mouth daily.   aspirin EC 81 MG tablet Take 81 mg by mouth daily.   busPIRone (BUSPAR) 10 MG tablet Take 1 tablet (10 mg total) by mouth in the morning and at bedtime.   diphenhydrAMINE (BENADRYL) 25 MG tablet Take 25 mg by mouth every 6 (six) hours as needed.   fluoruracil (CARAC) 0.5 % cream Apply twice daily to forehead and temples for 4-5 days and scalp for 5-6 days   lisinopril-hydrochlorothiazide (ZESTORETIC) 20-25 MG tablet Take 1 tablet by mouth daily.   pravastatin (PRAVACHOL) 40 MG tablet Take 1 tablet (40 mg total) by mouth daily.   sildenafil (REVATIO) 20 MG tablet Take 3-5 tablets as needed 30 prior to sex.   traZODone (DESYREL) 100 MG tablet Take 1 tablet (100 mg total) by mouth at bedtime. (Patient taking differently: Take 100 mg by mouth at bedtime as needed for sleep.)   vortioxetine HBr (TRINTELLIX) 10 MG TABS tablet Take 1 tablet (10 mg total) by mouth daily.   No facility-administered encounter medications on file as of 09/06/2021.    Allergies (verified) Patient has no known allergies.   History: Past Medical History:  Diagnosis Date   Allergy    Anxiety    Arthritis  Asthma    Foot deformity    foot arthrodesis L foot   Hyperlipidemia    Hypertension    Osteoporosis    PONV (postoperative nausea and vomiting)    Pre-diabetes    Shoulder bursitis    both    Tendinitis    elbow   Past Surgical History:  Procedure Laterality Date   COLONOSCOPY N/A 11/22/2020   Procedure: COLONOSCOPY;  Surgeon: Virgel Manifold, MD;  Location: [facility] END[facility]COPY;  Service: Endoscopy;  Laterality: N/A;   FOOT SURGERY Left 2001   Triple arthrodesis   KNEE ARTHROSCOPY WITH LATERAL MENISECTOMY Right 05/01/2021   Procedure: KNEE ARTHROSCOPY WITH LATERAL MENISECTOMY;  Surgeon: Hessie Knows, MD;  Location: [facility] ORS;  Service: Orthopedics;  Laterality: Right;   KNEE ARTHROSCOPY WITH MEDIAL MENISECTOMY Right 05/01/2021   Procedure: KNEE ARTHROSCOPY WITH  MEDIAL MENISECTOMY;  Surgeon: Hessie Knows, MD;  Location: [facility] ORS;  Service: Orthopedics;  Laterality: Right;   QUADRICEPS TENDON REPAIR Right 07/14/2020   Procedure: REPAIR QUADRICEP TENDON;  Surgeon: Hessie Knows, MD;  Location: [facility] ORS;  Service: Orthopedics;  Laterality: Right;   QUADRICEPS TENDON REPAIR Right 05/01/2021   Procedure: REPAIR QUADRICEP TENDON;  Surgeon: Hessie Knows, MD;  Location: [facility] ORS;  Service: Orthopedics;  Laterality: Right;   REPAIR QUADRICEPS/HAMSTRING MUSCLES Right 05/04/2020   Procedure: REPAIR QUADRICEPS/HAMSTRING MUSCLES;  Surgeon: Hessie Knows, MD;  Location: [facility] ORS;  Service: Orthopedics;  Laterality: Right;  RIGHT QUADRICEPS   ROTATOR CUFF REPAIR Left    SHOULDER FUSION SURGERY Left    WRIST SURGERY     ganglion cyst   Family History  Problem Relation Age of Onset   Hyperlipidemia Mother    Hypertension Mother    Diabetes Mother    Depression Mother    Hypertension Father    Hyperlipidemia Father    Cancer Father        skin cancer   Hypertension Brother    Diabetes Maternal Grandmother    Heart attack Maternal Grandfather    Social History   Socioeconomic History   Marital status: Married    Spouse name: Not on file   Number of children: 4   Years of education: Not on file   Highest education level: Not on file  Occupational History   Occupation: disability   Tobacco Use   Smoking status: Never   Smokeless tobacco: Never  Vaping Use   Vaping Use: Never used  Substance and Sexual Activity   Alcohol use: Yes    Comment: occasionally   Drug use: No   Sexual activity: Not on file  Other Topics Concern   Not on file  Social History Narrative   Not on file   Social Determinants of Health   Financial Resource Strain: Low Risk    Difficulty of Paying Living Expenses: Not hard at all  Food Insecurity: No Food Insecurity   Worried About Charity fundraiser in the Last Year: Never true   [place] in the Last Year:  Never true  Transportation Needs: No Transportation Needs   Lack of Transportation (Medical): No   Lack of Transportation (Non-Medical): No  Physical Activity: Insufficiently Active   Days of Exercise per Week: 2 days   Minutes of Exercise per Session: 60 min  Stress: Not on file  Social Connections: Moderately Isolated   Frequency of Communication with Friends and Family: More than three times a week   Frequency of Social Gatherings with Friends and Family: More than  three times a week   Attends Religious Services: Never   Active Member of Clubs or Organizations: No   Attends Archivist Meetings: Never   Marital Status: Married    Tobacco Counseling Counseling given: Not Answered   Clinical Intake:     Pain : No/denies pain     Nutritional Status: BMI > 30  Obese Nutritional Risks: None Diabetes: No  How often do you need to have someone help you when you read instructions, pamphlets, or other written materials from your doctor or pharmacy?: 1 - Never  Diabetic?No    Interpreter Needed?: No  Information entered by :: Donnie Mesa, CMA   Activities of Daily Living    09/06/2021    1:31 PM 04/18/2021    8:20 AM  In your present state of health, do you have any difficulty performing the following activities:  Hearing? 1   Vision? 1   Difficulty concentrating or making decisions? 0   Walking or climbing stairs? 1   Dressing or bathing? 0   Doing errands, shopping? 0 0    Patient Care Team: Olin Hauser, DO as PCP - General (Family Medicine) Graylin Shiver, [place] (Rheumatology) Greg Cutter, LCSW as [place] (Licensed Clinical Social Worker) Florence Canner   Physical Therapist  [place], [place] any recent [place] you may have received from other than [facility] providers in the past year (date may be approximate).  Thr pt was admitted to [facility] [address] on 05/01/2021 for a  repair Quadricep tendon  by Hessie Knows, MD.     Assessment:   This is a routine wellness examination for Jorge Jordan.  Hearing/Vision screen No results found.  Dietary issues and exercise activities discussed: Current Exercise Habits: Structured exercise class, Type of exercise: strength training/weights, Time (Minutes): 60, Frequency (Times/Week): 2, Weekly Exercise (Minutes/Week): 120, Intensity: Mild, Exercise limited by: orthopedic condition(s)   Goals Addressed   None   Depression Screen    09/06/2021    1:31 PM 11/08/2020   10:38 AM 09/05/2020    2:04 PM 10/25/2019    2:13 PM 08/31/2019    2:04 PM 04/26/2019    9:25 AM 10/22/2018    8:17 AM  PHQ 2/9 Scores  PHQ - 2 Score 0 1 0 2 4 0 0  PHQ- 9 Score  '9  4 9 1 '$ 0    Fall Risk    09/06/2021    1:30 PM 11/08/2020   10:38 AM 09/05/2020    2:02 PM 08/31/2019    2:04 PM 04/26/2019    9:25 AM  Fall Risk   Falls in the past year? 0 1 1 0 0  Comment   slipped on ice, not sure what happened    Number falls in past yr: 0 1 1 0 0  Injury with Fall? 0 1 1 0 0  Risk for fall due to : No Fall Risks  Impaired mobility;Medication side effect;Impaired balance/gait;History of fall(s)    Follow up Falls evaluation completed Falls evaluation completed Falls evaluation completed;Education provided;Falls prevention discussed  Falls evaluation completed    FALL RISK PREVENTION PERTAINING TO THE HOME:  Any stairs in or around the home? Yes  If so, are there any without handrails? Yes  Home free of loose throw rugs in walkways, pet beds, electrical cords, etc? Yes  Adequate lighting in your home to reduce risk of falls? Yes   ASSISTIVE DEVICES UTILIZED TO  PREVENT FALLS:  Life alert? No  Use of a cane, walker or w/c? No  Grab bars in the bathroom? No  Shower chair or bench in shower? No  Elevated toilet seat or a handicapped toilet? No    Cognitive Function:        09/06/2021    1:32 PM 09/05/2020    2:05 PM  6CIT Screen  What Year? 0  points 0 points  What month? 0 points 0 points  What time? 0 points 0 points  Count back from 20 0 points 0 points  Months in reverse 0 points 0 points  Repeat phrase 0 points 0 points  Total Score 0 points 0 points    Immunizations Immunization History  Administered Date(s) Administered   Influenza,inj,Quad PF,6+ Mos 02/05/2017, 01/26/2018   Influenza-Unspecified 02/05/2017, 01/26/2018, 02/08/2019   PFIZER Comirnaty(Gray Top)Covid-19 Tri-Sucrose Vaccine 07/01/2019, 07/22/2019   PFIZER(Purple Top)SARS-COV-2 Vaccination 07/01/2019, 07/22/2019, 08/25/2020   Tdap 10/19/2012   Zoster Recombinat (Shingrix) 01/03/2020, 05/19/2020    TDAP status: Up to date  Flu Vaccine status: Due, Education has been provided regarding the importance of this vaccine. Advised may receive this vaccine at local pharmacy or Health Dept. Aware to provide a copy of the vaccination record if obtained from local pharmacy or Health Dept. Verbalized acceptance and understanding.  Pneumococcal vaccine status: Up to date  COVID Vaccine: Up-to-date  Qualifies for Shingles Vaccine? Yes   Zostavax completed Yes   Shingrix Completed?: Yes  Screening Tests Health Maintenance  Topic Date Due   HIV Screening  Never done   INFLUENZA VACCINE  11/06/2021   COLONOSCOPY (Pts 45-8yr Insurance coverage will need to be confirmed)  11/22/2021   TETANUS/TDAP  10/20/2022   COVID-19 Vaccine  Completed   Hepatitis C Screening  Completed   Zoster Vaccines- Shingrix  Completed   HPV VACCINES  Aged Out   Fecal DNA (Cologuard)  Discontinued    Health Maintenance  Health Maintenance Due  Topic Date Due   HIV Screening  Never done    Colorectal cancer screening: Type of screening: Colonoscopy. Completed 11/22/2020. Repeat every 1 years  Lung Cancer Screening: (Low Dose CT Chest recommended if Age 61-80years, 30 pack-year currently smoking OR have quit w/in 15years.) does not qualify.   Lung Cancer Screening Referral:  does not qualify   Additional Screening:  Hepatitis C Screening: does qualify; Completed 06/07/2015   Vision Screening: Recommended annual ophthalmology exams for early detection of glaucoma and other disorders of the eye. Is the patient up to date with their annual eye exam?  No  Who is the provider or what is the name of the office in which the patient attends annual eye exams?  If pt is not established with a provider, would they like to be referred to a provider to establish care? No .   Dental Screening: Recommended annual dental exams for proper oral hygiene  Community Resource Referral / Chronic Care Management: CRR required this visit?  No   CCM required this visit?  No      Plan:     I have personally reviewed and noted the following in the patient's chart:   Medical and social history Use of alcohol, tobacco or illicit drugs  Current medications and supplements including opioid prescriptions. Patient is not currently taking opioid prescriptions. Functional ability and status Nutritional status Physical activity Advanced directives List of other physicians Hospitalizations, surgeries, and ER visits in previous 12 months Vitals Screenings to include cognitive, depression, and  falls Referrals and appointments  In addition, I have reviewed and discussed with patient certain preventive protocols, quality metrics, and best practice recommendations. A written personalized care plan for preventive services as well as general preventive health recommendations were provided to patient.     Mr. Allston , Thank you for taking time to come for your Medicare Wellness Visit. I appreciate your ongoing commitment to your health goals. Please review the following plan we discussed and let me know if I can assist you in the future.   These are the goals we discussed:  Goals      Patient Stated     09/05/2020, wants to lose 10 pounds        This is a list of the screening  recommended for you and due dates:  Health Maintenance  Topic Date Due   HIV Screening  Never done   Flu Shot  11/06/2021   Colon Cancer Screening  11/22/2021   Tetanus Vaccine  10/20/2022   COVID-19 Vaccine  Completed   Hepatitis C Screening: USPSTF Recommendation to screen - Ages 18-79 yo.  Completed   Zoster (Shingles) Vaccine  Completed   HPV Vaccine  Aged Out   Cologuard (Stool DNA test)  Discontinued    Wilson Singer, CMA   09/06/2021

## 2021-09-06 NOTE — Patient Instructions (Signed)
Health Maintenance, Male Adopting a healthy lifestyle and getting preventive care are important in promoting health and wellness. Ask your health care provider about: The right schedule for you to have regular tests and exams. Things you can do on your own to prevent diseases and keep yourself healthy. What should I know about diet, weight, and exercise? Eat a healthy diet  Eat a diet that includes plenty of vegetables, fruits, low-fat dairy products, and lean protein. Do not eat a lot of foods that are high in solid fats, added sugars, or sodium. Maintain a healthy weight Body mass index (BMI) is a measurement that can be used to identify possible weight problems. It estimates body fat based on height and weight. Your health care provider can help determine your BMI and help you achieve or maintain a healthy weight. Get regular exercise Get regular exercise. This is one of the most important things you can do for your health. Most adults should: Exercise for at least 150 minutes each week. The exercise should increase your heart rate and make you sweat (moderate-intensity exercise). Do strengthening exercises at least twice a week. This is in addition to the moderate-intensity exercise. Spend less time sitting. Even light physical activity can be beneficial. Watch cholesterol and blood lipids Have your blood tested for lipids and cholesterol at 61 years of age, then have this test every 5 years. You may need to have your cholesterol levels checked more often if: Your lipid or cholesterol levels are high. You are older than 61 years of age. You are at high risk for heart disease. What should I know about cancer screening? Many types of cancers can be detected early and may often be prevented. Depending on your health history and family history, you may need to have cancer screening at various ages. This may include screening for: Colorectal cancer. Prostate cancer. Skin cancer. Lung  cancer. What should I know about heart disease, diabetes, and high blood pressure? Blood pressure and heart disease High blood pressure causes heart disease and increases the risk of stroke. This is more likely to develop in people who have high blood pressure readings or are overweight. Talk with your health care provider about your target blood pressure readings. Have your blood pressure checked: Every 3-5 years if you are 18-39 years of age. Every year if you are 40 years old or older. If you are between the ages of 65 and 75 and are a current or former smoker, ask your health care provider if you should have a one-time screening for abdominal aortic aneurysm (AAA). Diabetes Have regular diabetes screenings. This checks your fasting blood sugar level. Have the screening done: Once every three years after age 45 if you are at a normal weight and have a low risk for diabetes. More often and at a younger age if you are overweight or have a high risk for diabetes. What should I know about preventing infection? Hepatitis B If you have a higher risk for hepatitis B, you should be screened for this virus. Talk with your health care provider to find out if you are at risk for hepatitis B infection. Hepatitis C Blood testing is recommended for: Everyone born from 1945 through 1965. Anyone with known risk factors for hepatitis C. Sexually transmitted infections (STIs) You should be screened each year for STIs, including gonorrhea and chlamydia, if: You are sexually active and are younger than 61 years of age. You are older than 61 years of age and your   health care provider tells you that you are at risk for this type of infection. Your sexual activity has changed since you were last screened, and you are at increased risk for chlamydia or gonorrhea. Ask your health care provider if you are at risk. Ask your health care provider about whether you are at high risk for HIV. Your health care provider  may recommend a prescription medicine to help prevent HIV infection. If you choose to take medicine to prevent HIV, you should first get tested for HIV. You should then be tested every 3 months for as long as you are taking the medicine. Follow these instructions at home: Alcohol use Do not drink alcohol if your health care provider tells you not to drink. If you drink alcohol: Limit how much you have to 0-2 drinks a day. Know how much alcohol is in your drink. In the [place] one drink equals one 12 oz bottle of beer (355 mL), one 5 oz glass of wine (148 mL), or one 1 oz glass of hard liquor (44 mL). Lifestyle Do not use any products that contain nicotine or tobacco. These products include cigarettes, chewing tobacco, and vaping devices, such as e-cigarettes. If you need help quitting, ask your health care provider. Do not use street drugs. Do not share needles. Ask your health care provider for help if you need support or information about quitting drugs. General instructions Schedule regular health, dental, and eye exams. Stay current with your vaccines. Tell your health care provider if: You often feel depressed. You have ever been abused or do not feel safe at home. Summary Adopting a healthy lifestyle and getting preventive care are important in promoting health and wellness. Follow your health care provider's instructions about healthy diet, exercising, and getting tested or screened for diseases. Follow your health care provider's instructions on monitoring your cholesterol and blood pressure. This information is not intended to replace advice given to you by your health care provider. Make sure you discuss any questions you have with your health care provider. Document Revised: 08/14/2020 Document Reviewed: 08/14/2020 Elsevier Patient Education  2023 [employer] Inc.  

## 2021-09-07 DIAGNOSIS — S76111S Strain of right quadriceps muscle, fascia and tendon, sequela: Secondary | ICD-10-CM | POA: Diagnosis not present

## 2021-09-07 DIAGNOSIS — M25661 Stiffness of right knee, not elsewhere classified: Secondary | ICD-10-CM | POA: Diagnosis not present

## 2021-09-11 ENCOUNTER — Ambulatory Visit: Payer: Medicare HMO

## 2021-09-11 DIAGNOSIS — S76111S Strain of right quadriceps muscle, fascia and tendon, sequela: Secondary | ICD-10-CM | POA: Diagnosis not present

## 2021-09-11 DIAGNOSIS — M25661 Stiffness of right knee, not elsewhere classified: Secondary | ICD-10-CM | POA: Diagnosis not present

## 2021-09-14 DIAGNOSIS — S76111S Strain of right quadriceps muscle, fascia and tendon, sequela: Secondary | ICD-10-CM | POA: Diagnosis not present

## 2021-09-14 DIAGNOSIS — M25661 Stiffness of right knee, not elsewhere classified: Secondary | ICD-10-CM | POA: Diagnosis not present

## 2021-09-18 DIAGNOSIS — S76111S Strain of right quadriceps muscle, fascia and tendon, sequela: Secondary | ICD-10-CM | POA: Diagnosis not present

## 2021-09-18 DIAGNOSIS — M25661 Stiffness of right knee, not elsewhere classified: Secondary | ICD-10-CM | POA: Diagnosis not present

## 2021-11-27 ENCOUNTER — Other Ambulatory Visit: Payer: Self-pay | Admitting: Family Medicine

## 2021-11-27 DIAGNOSIS — E7800 Pure hypercholesterolemia, unspecified: Secondary | ICD-10-CM

## 2021-11-27 DIAGNOSIS — I10 Essential (primary) hypertension: Secondary | ICD-10-CM

## 2021-11-27 NOTE — Telephone Encounter (Signed)
Requested medication (s) are due for refill today: expired medication  Requested medication (s) are on the active medication list: yes  Last refill:  11/08/20 #90 3 refills  Future visit scheduled: no  Notes to clinic:  expired medication . Do you want to renew Rxs?     Requested Prescriptions  Pending Prescriptions Disp Refills   amLODipine (NORVASC) 5 MG tablet [Pharmacy Med Name: amLODIPine BESYLATE 5 MG TAB] 90 tablet 3    Sig: TAKE ONE TABLET BY MOUTH DAILY     Cardiovascular: Calcium Channel Blockers 2 Passed - 11/27/2021  6:22 AM      Passed - Last BP in normal range    BP Readings from Last 1 Encounters:  05/01/21 130/86         Passed - Last Heart Rate in normal range    Pulse Readings from Last 1 Encounters:  05/01/21 90         Passed - Valid encounter within last 6 months    Recent Outpatient Visits           1 year ago Annual physical exam   [facility] Olin Hauser, DO   1 year ago Acute non-recurrent frontal sinusitis   [place], DO   2 years ago Annual physical exam   [facility] Olin Hauser, DO   2 years ago Major depressive disorder, recurrent episode with anxious distress ([place])   [facility] Olin Hauser, DO   3 years ago Annual physical exam   [place], DO               pravastatin (PRAVACHOL) 40 MG tablet [Pharmacy Med Name: PRAVASTATIN SODIUM 40 MG TAB] 90 tablet 3    Sig: TAKE ONE TABLET BY MOUTH DAILY     Cardiovascular:  Antilipid - Statins Failed - 11/27/2021  6:22 AM      Failed - Lipid Panel in normal range within the last 12 months    Cholesterol, Total  Date Value Ref Range Status  11/06/2020 207 (H) 100 - 199 mg/dL Final   LDL Chol Calc (NIH)  Date Value Ref Range Status  11/06/2020 113 (H) 0 - 99 mg/dL Final   HDL  Date Value Ref Range Status  11/06/2020  47 >39 mg/dL Final   Triglycerides  Date Value Ref Range Status  11/06/2020 270 (H) 0 - 149 mg/dL Final         Passed - Patient is not pregnant      Passed - Valid encounter within last 12 months    Recent Outpatient Visits           1 year ago Annual physical exam   [place], DO   1 year ago Acute non-recurrent frontal sinusitis   [place], DO   2 years ago Annual physical exam   [facility] [place], Devonne Doughty, DO   2 years ago Major depressive disorder, recurrent episode with anxious distress [facility])   [place], DO   3 years ago Annual physical exam   [place], DO               lisinopril-hydrochlorothiazide (ZESTORETIC) 20-25 MG tablet [Pharmacy Med Name: LISINOPRIL-HCTZ 20-25 MG TAB] 90 tablet 3    Sig: TAKE  ONE TABLET BY MOUTH DAILY     Cardiovascular:  ACEI + Diuretic Combos Failed - 11/27/2021  6:22 AM      Failed - Na in normal range and within 180 days    Sodium  Date Value Ref Range Status  04/20/2021 136 135 - 145 mmol/L Final  11/06/2020 137 134 - 144 mmol/L Final         Failed - K in normal range and within 180 days    Potassium  Date Value Ref Range Status  04/20/2021 3.4 (L) 3.5 - 5.1 mmol/L Final         Failed - Cr in normal range and within 180 days    Creatinine, Ser  Date Value Ref Range Status  04/20/2021 1.10 0.61 - 1.24 mg/dL Final         Failed - eGFR is 30 or above and within 180 days    GFR calc Af Amer  Date Value Ref Range Status  10/21/2019 73 >59 mL/min/1.73 Final    Comment:    **[facility] currently reports eGFR in compliance with the current**   recommendations of the [employer]. [facility] will   update reporting as new guidelines are published from the NKF-ASN   Task force.    GFR, Estimated  Date Value Ref  Range Status  04/20/2021 >60 >60 mL/min Final    Comment:    (NOTE) Calculated using the CKD-EPI Creatinine Equation (2021)    eGFR  Date Value Ref Range Status  11/06/2020 79 >59 mL/min/1.73 Final         Passed - Patient is not pregnant      Passed - Last BP in normal range    BP Readings from Last 1 Encounters:  05/01/21 130/86         Passed - Valid encounter within last 6 months    Recent Outpatient Visits           1 year ago Annual physical exam   [place], DO   1 year ago Acute non-recurrent frontal sinusitis   [place], DO   2 years ago Annual physical exam   [facility] Olin Hauser, DO   2 years ago Major depressive disorder, recurrent episode with anxious distress [facility])   [facility] Olin Hauser, DO   3 years ago Annual physical exam   [place], Devonne Doughty, DO

## 2021-12-13 ENCOUNTER — Other Ambulatory Visit: Payer: Self-pay | Admitting: Family Medicine

## 2021-12-13 DIAGNOSIS — F4310 Post-traumatic stress disorder, unspecified: Secondary | ICD-10-CM

## 2021-12-14 ENCOUNTER — Other Ambulatory Visit: Payer: Self-pay | Admitting: Family Medicine

## 2021-12-14 DIAGNOSIS — N522 Drug-induced erectile dysfunction: Secondary | ICD-10-CM

## 2021-12-14 NOTE — Telephone Encounter (Signed)
Pt given a 90 day courtesy refill. Sent pt a MyChart message to call office to make appt.  Requested Prescriptions  Pending Prescriptions Disp Refills   busPIRone (BUSPAR) 10 MG tablet [Pharmacy Med Name: busPIRone HCL 10 MG TABLET] 180 tablet 3    Sig: TAKE ONE TABLET BY MOUTH EVERY MORNING AND TAKE ONE TABLET BY MOUTH EVERY NIGHT AT BEDTIME     Psychiatry: Anxiolytics/Hypnotics - Non-controlled Passed - 12/13/2021  6:22 AM      Passed - Valid encounter within last 12 months    Recent Outpatient Visits           1 year ago Annual physical exam   [place], Devonne Doughty, DO   2 years ago Acute non-recurrent frontal sinusitis   [place], DO   2 years ago Annual physical exam   [facility] Olin Hauser, DO   2 years ago Major depressive disorder, recurrent episode with anxious distress [facility])   [facility] Olin Hauser, DO   3 years ago Annual physical exam   [place], Devonne Doughty, DO

## 2021-12-17 ENCOUNTER — Other Ambulatory Visit: Payer: Self-pay

## 2021-12-17 DIAGNOSIS — N522 Drug-induced erectile dysfunction: Secondary | ICD-10-CM

## 2021-12-17 MED ORDER — SILDENAFIL CITRATE 20 MG PO TABS: ORAL_TABLET | ORAL | 3 refills | Status: DC

## 2021-12-18 NOTE — Telephone Encounter (Signed)
reordered 12/17/21   Requested Prescriptions  Refused Prescriptions Disp Refills  . sildenafil (REVATIO) 20 MG tablet [Pharmacy Med Name: SILDENAFIL 20 MG TABLET] 30 tablet 3    Sig: TAKE 3-5 TABLETS BY MOUTH AS NEEDED 30 MINUTES PRIOR TO SEX     Urology: Erectile Dysfunction Agents Failed - 12/14/2021  6:10 PM      Failed - AST in normal range and within 360 days    AST  Date Value Ref Range Status  11/06/2020 23 0 - 40 IU/L Final         Failed - ALT in normal range and within 360 days    ALT  Date Value Ref Range Status  11/06/2020 30 0 - 44 IU/L Final         Passed - Last BP in normal range    BP Readings from Last 1 Encounters:  05/01/21 130/86         Passed - Valid encounter within last 12 months    Recent Outpatient Visits          1 year ago Annual physical exam   [place], DO   2 years ago Acute non-recurrent frontal sinusitis   [place], DO   2 years ago Annual physical exam   [facility] Olin Hauser, DO   2 years ago Major depressive disorder, recurrent episode with anxious distress [facility])   [facility] Olin Hauser, DO   3 years ago Annual physical exam   [place], DO      Future Appointments            In 1 month [place], Devonne Doughty, [facility], [facility]

## 2021-12-26 ENCOUNTER — Other Ambulatory Visit: Payer: Self-pay

## 2021-12-26 DIAGNOSIS — E7800 Pure hypercholesterolemia, unspecified: Secondary | ICD-10-CM

## 2021-12-26 DIAGNOSIS — I10 Essential (primary) hypertension: Secondary | ICD-10-CM

## 2021-12-26 MED ORDER — AMLODIPINE BESYLATE 5 MG PO TABS: 5.0000 mg | ORAL_TABLET | Freq: Every day | ORAL | 1 refills | Status: DC

## 2021-12-26 MED ORDER — PRAVASTATIN SODIUM 40 MG PO TABS: 40.0000 mg | ORAL_TABLET | Freq: Every day | ORAL | 1 refills | Status: DC

## 2021-12-26 MED ORDER — LISINOPRIL-HYDROCHLOROTHIAZIDE 20-25 MG PO TABS: 1.0000 | ORAL_TABLET | Freq: Every day | ORAL | 1 refills | Status: DC

## 2022-01-29 ENCOUNTER — Telehealth: Payer: Self-pay

## 2022-01-29 DIAGNOSIS — I10 Essential (primary) hypertension: Secondary | ICD-10-CM

## 2022-01-29 DIAGNOSIS — R7303 Prediabetes: Secondary | ICD-10-CM

## 2022-01-29 DIAGNOSIS — Z125 Encounter for screening for malignant neoplasm of prostate: Secondary | ICD-10-CM

## 2022-01-29 DIAGNOSIS — Z0000 Encounter for general adult medical examination without abnormal findings: Secondary | ICD-10-CM

## 2022-01-29 DIAGNOSIS — E782 Mixed hyperlipidemia: Secondary | ICD-10-CM

## 2022-01-29 NOTE — Telephone Encounter (Signed)
All [facility] Orders are placed and released.  He may pick up order reqs or just go to [facility] site that can use active orders in system  Nobie Putnam, [facility] 01/29/2022, 6:24 PM

## 2022-01-29 NOTE — Telephone Encounter (Signed)
Copied from [place] [id number]. Topic: General - Inquiry >> Jan 29, 2022  3:29 PM Marcellus Scott wrote: Reason for CRM: Pt is requesting to schedule labs before upcoming CPE. Pt would like to go to [facility] for labs wife is an employee and his labs are free.   Please advise.

## 2022-02-01 DIAGNOSIS — D2261 Melanocytic nevi of right upper limb, including shoulder: Secondary | ICD-10-CM | POA: Diagnosis not present

## 2022-02-01 DIAGNOSIS — D2272 Melanocytic nevi of left lower limb, including hip: Secondary | ICD-10-CM | POA: Diagnosis not present

## 2022-02-01 DIAGNOSIS — L570 Actinic keratosis: Secondary | ICD-10-CM | POA: Diagnosis not present

## 2022-02-01 DIAGNOSIS — Z85828 Personal history of other malignant neoplasm of skin: Secondary | ICD-10-CM | POA: Diagnosis not present

## 2022-02-01 DIAGNOSIS — D2262 Melanocytic nevi of left upper limb, including shoulder: Secondary | ICD-10-CM | POA: Diagnosis not present

## 2022-02-01 DIAGNOSIS — X32XXXA Exposure to sunlight, initial encounter: Secondary | ICD-10-CM | POA: Diagnosis not present

## 2022-02-07 ENCOUNTER — Encounter: Payer: Self-pay | Admitting: Family Medicine

## 2022-02-07 ENCOUNTER — Ambulatory Visit (INDEPENDENT_AMBULATORY_CARE_PROVIDER_SITE_OTHER): Payer: Medicare HMO | Admitting: Family Medicine

## 2022-02-07 VITALS — BP 116/70 | HR 93 | Ht 68.0 in | Wt 210.5 lb

## 2022-02-07 DIAGNOSIS — Z0000 Encounter for general adult medical examination without abnormal findings: Secondary | ICD-10-CM | POA: Diagnosis not present

## 2022-02-07 DIAGNOSIS — I10 Essential (primary) hypertension: Secondary | ICD-10-CM

## 2022-02-07 DIAGNOSIS — R7303 Prediabetes: Secondary | ICD-10-CM | POA: Diagnosis not present

## 2022-02-07 DIAGNOSIS — E782 Mixed hyperlipidemia: Secondary | ICD-10-CM | POA: Diagnosis not present

## 2022-02-07 DIAGNOSIS — Z114 Encounter for screening for human immunodeficiency virus [HIV]: Secondary | ICD-10-CM | POA: Diagnosis not present

## 2022-02-07 DIAGNOSIS — F4310 Post-traumatic stress disorder, unspecified: Secondary | ICD-10-CM | POA: Diagnosis not present

## 2022-02-07 DIAGNOSIS — F339 Major depressive disorder, recurrent, unspecified: Secondary | ICD-10-CM | POA: Diagnosis not present

## 2022-02-07 DIAGNOSIS — Z125 Encounter for screening for malignant neoplasm of prostate: Secondary | ICD-10-CM | POA: Diagnosis not present

## 2022-02-07 DIAGNOSIS — E7800 Pure hypercholesterolemia, unspecified: Secondary | ICD-10-CM

## 2022-02-07 DIAGNOSIS — N522 Drug-induced erectile dysfunction: Secondary | ICD-10-CM

## 2022-02-07 DIAGNOSIS — E669 Obesity, unspecified: Secondary | ICD-10-CM

## 2022-02-07 MED ORDER — AMLODIPINE BESYLATE 5 MG PO TABS: 5.0000 mg | ORAL_TABLET | Freq: Every day | ORAL | 3 refills | Status: DC

## 2022-02-07 MED ORDER — BUSPIRONE HCL 10 MG PO TABS: 10.0000 mg | ORAL_TABLET | Freq: Two times a day (BID) | ORAL | 3 refills | Status: DC

## 2022-02-07 MED ORDER — PRAVASTATIN SODIUM 40 MG PO TABS: 40.0000 mg | ORAL_TABLET | Freq: Every day | ORAL | 3 refills | Status: DC

## 2022-02-07 MED ORDER — SILDENAFIL CITRATE 20 MG PO TABS: ORAL_TABLET | ORAL | 11 refills | Status: DC

## 2022-02-07 MED ORDER — LISINOPRIL-HYDROCHLOROTHIAZIDE 20-25 MG PO TABS: 1.0000 | ORAL_TABLET | Freq: Every day | ORAL | 3 refills | Status: DC

## 2022-02-07 NOTE — Patient Instructions (Addendum)
Thank you for coming to the office today.  All refills submitted.  I did not re order Trazodone yet. Since it is as needed. Let me know when you need more.  Sildenafil refilled as well.  Lab work ordered for [facility].  [employer] at [facility] when ready.  Return for Shoulder injection   Please schedule a Follow-up Appointment to: Return in about 1 year (around 02/08/2023) for 1 year fasting [facility] orders - then 1 week later Annual Physical.  If you have any other questions or concerns, please feel free to call the office or send a message through [facility]. You may also schedule an earlier appointment if necessary.  Additionally, you may be receiving a survey about your experience at our office within a few days to 1 week by e-mail or mail. We value your feedback.  Nobie Putnam, DO [place]

## 2022-02-07 NOTE — Assessment & Plan Note (Signed)
Previously improved Concern with obesity, HTN, HLD  Plan:  1. Not on any therapy currently 2. Encourage improved lifestyle - low carb, low sugar diet, reduce portion size, continue improving regular exercise 3. Follow-up 6 months w/ repeat lab

## 2022-02-07 NOTE — Progress Notes (Signed)
Subjective:    Patient ID: Jorge Jordan, male    DOB: 04-28-1960, 61 y.o.   MRN: [id number]  Jorge Jordan is a 61 y.o. male presenting on 02/07/2022 for Annual Exam   HPI  Here for Annual Physical and Lab Review  Elevated A1c - Pre-Diabetes / Obesity BMI >32 Meds: None (never on meds) Currently on ACEi Lifestyle: Mild weight gain Diet - no significant changes has tried to improve diet, but not always Now abstain from alcohol - Exercise (improving) - Family history of DM Denies hypoglycemia   CHRONIC HTN: Reports checks BP at home with normal BP readings on avg still. 100-120 SBP av  Current Meds - Lisinopril-HCTZ 20-'25mg'$  daily, Amlodipine '5mg'$  daily Reports good compliance, took meds today. Tolerating well, w/o complaints. Denies CP, dyspnea, HA, edema, dizziness / lightheadedness   HYPERLIPIDEMIA: History fatty liver - Currently taking Pravastatin '40mg'$ , tolerating well without side effects or myalgias   Hepatic Steatosis Elevated LFTs - resolved, normalized since 2021, normal results on LFT today No new symptoms or concerns. Now abstaining from alcohol mostly, rare occasional drink. Prior US 04/2019 showed diffuse changes, see result.   Major Depression, chronic recurrent / Anxiety + PTSD significant improvement Continues to do therapy/counseling every other month virtually now on computer with benefits No longer followed by Psychiatry. - He is tolerating meds well, without concern, see PHQ GAD score below - Continues on Trintellix '10mg'$  daily, Trazodone '100mg'$  nightly AS NEEDED for insomnia using about 1x weekly, Buspirone '10mg'$  TWICE A DAY regularly Trintellix rx authorized on his manufacturer discount  next due 01/2021  Upcoming Trintellix application, '10mg'$  daily. Will renew when receive paperwork  Left Shoulder, Rotator Cuff Repair Prior s/p rotation 03/2008 Has had some gradual wear and tear worsening pain He will return for injection and referral     Past Surgical History:  Procedure Laterality Date   COLONOSCOPY N/A 11/22/2020   Procedure: COLONOSCOPY;  Surgeon: Virgel Manifold, MD;  Location: [facility];  Service: Endoscopy;  Laterality: N/A;   FOOT SURGERY Left 2001   Triple arthrodesis   KNEE ARTHROSCOPY WITH LATERAL MENISECTOMY Right 05/01/2021   Procedure: KNEE ARTHROSCOPY WITH LATERAL MENISECTOMY;  Surgeon: Hessie Knows, MD;  Location: [facility];  Service: Orthopedics;  Laterality: Right;   KNEE ARTHROSCOPY WITH MEDIAL MENISECTOMY Right 05/01/2021   Procedure: KNEE ARTHROSCOPY WITH MEDIAL MENISECTOMY;  Surgeon: Hessie Knows, MD;  Location: [facility];  Service: Orthopedics;  Laterality: Right;   QUADRICEPS TENDON REPAIR Right 07/14/2020   Procedure: REPAIR QUADRICEP TENDON;  Surgeon: Hessie Knows, MD;  Location: [facility];  Service: Orthopedics;  Laterality: Right;   QUADRICEPS TENDON REPAIR Right 05/01/2021   Procedure: REPAIR QUADRICEP TENDON;  Surgeon: Hessie Knows, MD;  Location: [facility];  Service: Orthopedics;  Laterality: Right;   REPAIR QUADRICEPS/HAMSTRING MUSCLES Right 05/04/2020   Procedure: REPAIR QUADRICEPS/HAMSTRING MUSCLES;  Surgeon: Hessie Knows, MD;  Location: [facility];  Service: Orthopedics;  Laterality: Right;  RIGHT QUADRICEPS   ROTATOR CUFF REPAIR Left    SHOULDER FUSION SURGERY Left    WRIST SURGERY     ganglion cyst      Health Maintenance:   Prostate CA Screening: Prior PSA / DRE reported normal. Last PSA 1.2 (11/2020). Currently asymptomatic without any BPH LUTS. No known family history of prostate CA.   Colon CA Screening: Cologuard 02/17/18 (negative). then he completed Colonoscopy Dr Bonna Gains 11/22/20 inadequate prep recommend repeat 1-2 years - Currently asymptomatic. No known family history of colon CA.  -  Repeat Colonoscopy 1 more year, end of 2024      02/07/2022    9:02 AM 09/06/2021    1:31 PM 11/08/2020   10:38 AM  Depression screen PHQ 2/9  Decreased Interest 2 0 0  Down,  Depressed, Hopeless 1 0 1  PHQ - 2 Score 3 0 1  Altered sleeping 2  2  Tired, decreased energy 1  1  Change in appetite 1  2  Feeling bad or failure about yourself  1  1  Trouble concentrating 0  1  Moving slowly or fidgety/restless 0  1  Suicidal thoughts 0  0  PHQ-9 Score 8  9  Difficult doing work/chores Not difficult at all  Somewhat difficult    Past Medical History:  Diagnosis Date   Allergy    Anxiety    Arthritis    Asthma    Foot deformity    foot arthrodesis L foot   Hyperlipidemia    Hypertension    Osteoporosis    PONV (postoperative nausea and vomiting)    Pre-diabetes    Shoulder bursitis    both    Tendinitis    elbow   Past Surgical History:  Procedure Laterality Date   COLONOSCOPY N/A 11/22/2020   Procedure: COLONOSCOPY;  Surgeon: Virgel Manifold, MD;  Location: [facility];  Service: Endoscopy;  Laterality: N/A;   FOOT SURGERY Left 2001   Triple arthrodesis   KNEE ARTHROSCOPY WITH LATERAL MENISECTOMY Right 05/01/2021   Procedure: KNEE ARTHROSCOPY WITH LATERAL MENISECTOMY;  Surgeon: Hessie Knows, MD;  Location: [facility];  Service: Orthopedics;  Laterality: Right;   KNEE ARTHROSCOPY WITH MEDIAL MENISECTOMY Right 05/01/2021   Procedure: KNEE ARTHROSCOPY WITH MEDIAL MENISECTOMY;  Surgeon: Hessie Knows, MD;  Location: [facility];  Service: Orthopedics;  Laterality: Right;   QUADRICEPS TENDON REPAIR Right 07/14/2020   Procedure: REPAIR QUADRICEP TENDON;  Surgeon: Hessie Knows, MD;  Location: [facility];  Service: Orthopedics;  Laterality: Right;   QUADRICEPS TENDON REPAIR Right 05/01/2021   Procedure: REPAIR QUADRICEP TENDON;  Surgeon: Hessie Knows, MD;  Location: [facility];  Service: Orthopedics;  Laterality: Right;   REPAIR QUADRICEPS/HAMSTRING MUSCLES Right 05/04/2020   Procedure: REPAIR QUADRICEPS/HAMSTRING MUSCLES;  Surgeon: Hessie Knows, MD;  Location: [facility];  Service: Orthopedics;  Laterality: Right;  RIGHT QUADRICEPS   ROTATOR CUFF REPAIR  Left    SHOULDER FUSION SURGERY Left    WRIST SURGERY     ganglion cyst   Social History   Socioeconomic History   Marital status: Married    Spouse name: Not on file   Number of children: 4   Years of education: Not on file   Highest education level: Not on file  Occupational History   Occupation: disability   Tobacco Use   Smoking status: Never   Smokeless tobacco: Never  Vaping Use   Vaping Use: Never used  Substance and Sexual Activity   Alcohol use: Yes    Comment: occasionally   Drug use: No   Sexual activity: Not on file  Other Topics Concern   Not on file  Social History Narrative   Not on file   Social Determinants of Health   Financial Resource Strain: Low Risk  (09/06/2021)   Overall Financial Resource Strain (CARDIA)    Difficulty of Paying Living Expenses: Not hard at all  Food Insecurity: No Food Insecurity (09/06/2021)   Hunger Vital Sign    Worried About Running Out of Food in the Last Year: Never true  Ran Out of Food in the Last Year: Never true  Transportation Needs: No Transportation Needs (09/06/2021)   PRAPARE - Hydrologist (Medical): No    Lack of Transportation (Non-Medical): No  Physical Activity: Insufficiently Active (09/06/2021)   Exercise Vital Sign    Days of Exercise per Week: 2 days    Minutes of Exercise per Session: 60 min  Stress: No Stress Concern Present (09/05/2020)   [place]    Feeling of Stress : Not at all  Social Connections: Moderately Isolated (09/06/2021)   Social Connection and Isolation Panel [NHANES]    Frequency of Communication with Friends and Family: More than three times a week    Frequency of Social Gatherings with Friends and Family: More than three times a week    Attends Religious Services: Never    Marine scientist or Organizations: No    Attends Archivist Meetings: Never    Marital Status: Married   Human resources officer Violence: Not At Risk (09/06/2021)   Humiliation, Afraid, Rape, and Kick questionnaire    Fear of Current or Ex-Partner: No    Emotionally Abused: No    Physically Abused: No    Sexually Abused: No   Family History  Problem Relation Age of Onset   Hyperlipidemia Mother    Hypertension Mother    Diabetes Mother    Depression Mother    Hypertension Father    Hyperlipidemia Father    Cancer Father        skin cancer   Hypertension Brother    Diabetes Maternal Grandmother    Heart attack Maternal Grandfather    Current Outpatient Medications on File Prior to Visit  Medication Sig   acetaminophen (TYLENOL) 500 MG tablet Take 1,000 mg by mouth every 6 (six) hours as needed for moderate pain.   aspirin EC 81 MG tablet Take 81 mg by mouth daily.   diphenhydrAMINE (BENADRYL) 25 MG tablet Take 25 mg by mouth every 6 (six) hours as needed.   fluoruracil (CARAC) 0.5 % cream Apply twice daily to forehead and temples for 4-5 days and scalp for 5-6 days   traZODone (DESYREL) 100 MG tablet Take 1 tablet (100 mg total) by mouth at bedtime. (Patient taking differently: Take 100 mg by mouth at bedtime as needed for sleep.)   vortioxetine HBr (TRINTELLIX) 10 MG TABS tablet Take 1 tablet (10 mg total) by mouth daily.   No current facility-administered medications on file prior to visit.    Review of Systems  Constitutional:  Negative for activity change, appetite change, chills, diaphoresis, fatigue and fever.  HENT:  Negative for congestion and hearing loss.   Eyes:  Negative for visual disturbance.  Respiratory:  Negative for cough, chest tightness, shortness of breath and wheezing.   Cardiovascular:  Negative for chest pain, palpitations and leg swelling.  Gastrointestinal:  Negative for abdominal pain, constipation, diarrhea, nausea and vomiting.  Genitourinary:  Negative for dysuria, frequency and hematuria.  Musculoskeletal:  Negative for arthralgias and neck pain.  Skin:   Negative for rash.  Neurological:  Negative for dizziness, weakness, light-headedness, numbness and headaches.  Hematological:  Negative for adenopathy.  Psychiatric/Behavioral:  Negative for behavioral problems, dysphoric mood and sleep disturbance.    Per HPI unless specifically indicated above      Objective:    BP 116/70   Pulse 93   Ht '5\' 8"'$  (1.727 m)   Wt  210 lb 8 oz (95.5 kg)   SpO2 100%   BMI 32.01 kg/m   Wt Readings from Last 3 Encounters:  02/07/22 210 lb 8 oz (95.5 kg)  05/01/21 212 lb (96.2 kg)  11/22/20 203 lb (92.1 kg)    Physical Exam Vitals and nursing note reviewed.  Constitutional:      General: He is not in acute distress.    Appearance: He is well-developed. He is not diaphoretic.     Comments: Well-appearing, comfortable, cooperative  HENT:     Head: Normocephalic and atraumatic.  Eyes:     General:        Right eye: No discharge.        Left eye: No discharge.     Conjunctiva/sclera: Conjunctivae normal.     Pupils: Pupils are equal, round, and reactive to light.  Neck:     Thyroid: No thyromegaly.  Cardiovascular:     Rate and Rhythm: Normal rate and regular rhythm.     Pulses: Normal pulses.     Heart sounds: Normal heart sounds. No murmur heard. Pulmonary:     Effort: Pulmonary effort is normal. No respiratory distress.     Breath sounds: Normal breath sounds. No wheezing or rales.  Abdominal:     General: Bowel sounds are normal. There is no distension.     Palpations: Abdomen is soft. There is no mass.     Tenderness: There is no abdominal tenderness.  Musculoskeletal:        General: No tenderness. Normal range of motion.     Cervical back: Normal range of motion and neck supple.     Comments: Upper / Lower Extremities: - Normal muscle tone, strength bilateral upper extremities 5/5, lower extremities 5/5  Lymphadenopathy:     Cervical: No cervical adenopathy.  Skin:    General: Skin is warm and dry.     Findings: No erythema or  rash.  Neurological:     Mental Status: He is alert and oriented to person, place, and time.     Comments: Distal sensation intact to light touch all extremities  Psychiatric:        Mood and Affect: Mood normal.        Behavior: Behavior normal.        Thought Content: Thought content normal.     Comments: Well groomed, good eye contact, normal speech and thoughts    Results for orders placed or performed during the hospital encounter of 04/20/21  CBC  Result Value Ref Range   WBC 4.1 4.0 - 10.5 K/uL   RBC 4.56 4.22 - 5.81 MIL/uL   Hemoglobin 15.6 13.0 - 17.0 g/dL   HCT 42.9 39.0 - 52.0 %   MCV 94.1 80.0 - 100.0 fL   MCH 34.2 (H) 26.0 - 34.0 pg   MCHC 36.4 (H) 30.0 - 36.0 g/dL   RDW 12.1 11.5 - 15.5 %   Platelets 224 150 - 400 K/uL   nRBC 0.0 0.0 - 0.2 %  Basic metabolic panel  Result Value Ref Range   Sodium 136 135 - 145 mmol/L   Potassium 3.4 (L) 3.5 - 5.1 mmol/L   Chloride 100 98 - 111 mmol/L   CO2 26 22 - 32 mmol/L   Glucose, Bld 159 (H) 70 - 99 mg/dL   BUN 18 6 - 20 mg/dL   Creatinine, Ser 1.10 0.61 - 1.24 mg/dL   Calcium 9.6 8.9 - 10.3 mg/dL   GFR, Estimated >60 >60 mL/min  Anion gap 10 5 - 15      Assessment & Plan:   Problem List Items Addressed This Visit     Essential hypertension    Controlled HTN - Home BP readings normal  Complication with CKD-II    Plan:  1. Continue current BP regimen - Amlodipine '5mg'$  daily, Lisinopril-HCTZ - 20-'25mg'$  daily 2. Encourage improved lifestyle - low sodium diet, regular exercise 3. Continue monitor BP outside office, bring readings to next visit, if persistently >140/90 or new symptoms notify office sooner  Future consider taper off Amlodipine vs lower dose Lisinopril-HCTZ if well controlled now w weight loss      Relevant Medications   amLODipine (NORVASC) 5 MG tablet   lisinopril-hydrochlorothiazide (ZESTORETIC) 20-25 MG tablet   pravastatin (PRAVACHOL) 40 MG tablet   sildenafil (REVATIO) 20 MG tablet   Other  Relevant Orders   CBC with Differential/Platelet   Lipid panel   Comprehensive metabolic panel   Hyperlipidemia    Previously controlled On statin The 10-year ASCVD risk score (Arnett DK, et al., 2019) is: 9.9% Hepatic steatosis  Plan: 1. Continue current meds - Pravastatin '40mg'$  daily 2. Continue ASA '81mg'$  for primary ASCVD risk reduction 3. Encourage improved lifestyle - low carb/cholesterol, reduce portion size, continue improving regular exercise      Relevant Medications   amLODipine (NORVASC) 5 MG tablet   lisinopril-hydrochlorothiazide (ZESTORETIC) 20-25 MG tablet   pravastatin (PRAVACHOL) 40 MG tablet   sildenafil (REVATIO) 20 MG tablet   Other Relevant Orders   Lipid panel   TSH   Major depressive disorder, recurrent episode with anxious distress ([place])    Controlled depression anxiety PTSD No longer followed by Psych Continues on Trintellix (financial asst from manufacturer, Buspirone, Trazodone  Next financial assistance app order of Trintellix 02/2022      Relevant Medications   busPIRone (BUSPAR) 10 MG tablet   Pre-diabetes    Previously improved Concern with obesity, HTN, HLD  Plan:  1. Not on any therapy currently 2. Encourage improved lifestyle - low carb, low sugar diet, reduce portion size, continue improving regular exercise 3. Follow-up 6 months w/ repeat lab      Relevant Orders   Hemoglobin A1c   PTSD (post-traumatic stress disorder)   Relevant Medications   busPIRone (BUSPAR) 10 MG tablet   Screening for prostate cancer   Relevant Orders   PSA   Other Visit Diagnoses     Annual physical exam    -  Primary   Relevant Orders   CBC with Differential/Platelet   Lipid panel   Hemoglobin A1c   PSA   TSH   Comprehensive metabolic panel   Screening for HIV (human immunodeficiency virus)       Relevant Orders   HIV Antibody (routine testing w rflx)   Hypercholesterolemia       Relevant Medications   amLODipine (NORVASC) 5 MG tablet    lisinopril-hydrochlorothiazide (ZESTORETIC) 20-25 MG tablet   pravastatin (PRAVACHOL) 40 MG tablet   sildenafil (REVATIO) 20 MG tablet   Drug-induced erectile dysfunction       Relevant Medications   sildenafil (REVATIO) 20 MG tablet   Obesity (BMI 30.0-34.9)           Updated Health Maintenance information Printed [facility] orders, give patient order req today Encouraged improvement to lifestyle with diet and exercise Goal of weight loss  Orders Placed This Encounter  Procedures   CBC with Differential/Platelet   Lipid panel    Order Specific Question:  Has the patient fasted?    Answer:   Yes   Hemoglobin A1c   PSA   TSH   Comprehensive metabolic panel    Order Specific Question:   Has the patient fasted?    Answer:   Yes   HIV Antibody (routine testing w rflx)     Meds ordered this encounter  Medications   busPIRone (BUSPAR) 10 MG tablet    Sig: Take 1 tablet (10 mg total) by mouth 2 (two) times daily.    Dispense:  180 tablet    Refill:  3    Add future refills   amLODipine (NORVASC) 5 MG tablet    Sig: Take 1 tablet (5 mg total) by mouth daily.    Dispense:  90 tablet    Refill:  3    Add refills   lisinopril-hydrochlorothiazide (ZESTORETIC) 20-25 MG tablet    Sig: Take 1 tablet by mouth daily.    Dispense:  90 tablet    Refill:  3    Add extra refills   pravastatin (PRAVACHOL) 40 MG tablet    Sig: Take 1 tablet (40 mg total) by mouth daily.    Dispense:  90 tablet    Refill:  3    Add refills future   sildenafil (REVATIO) 20 MG tablet    Sig: Take 3-5 tablets as needed 30 prior to sex.    Dispense:  30 tablet    Refill:  11    Add extra refills for future      Follow up plan: Return in about 1 year (around 02/08/2023) for 1 year fasting [facility] orders - then 1 week later Annual Physical.  Nobie Putnam, DO [facility] 02/07/2022, 9:12 AM

## 2022-02-07 NOTE — Assessment & Plan Note (Signed)
Controlled HTN - Home BP readings normal  Complication with CKD-II    Plan:  1. Continue current BP regimen - Amlodipine '5mg'$  daily, Lisinopril-HCTZ - 20-'25mg'$  daily 2. Encourage improved lifestyle - low sodium diet, regular exercise 3. Continue monitor BP outside office, bring readings to next visit, if persistently >140/90 or new symptoms notify office sooner  Future consider taper off Amlodipine vs lower dose Lisinopril-HCTZ if well controlled now w weight loss

## 2022-02-07 NOTE — Assessment & Plan Note (Signed)
Previously controlled On statin The 10-year ASCVD risk score (Arnett DK, et al., 2019) is: 9.9% Hepatic steatosis  Plan: 1. Continue current meds - Pravastatin '40mg'$  daily 2. Continue ASA '81mg'$  for primary ASCVD risk reduction 3. Encourage improved lifestyle - low carb/cholesterol, reduce portion size, continue improving regular exercise

## 2022-02-07 NOTE — Assessment & Plan Note (Signed)
Controlled depression anxiety PTSD No longer followed by Psych Continues on Trintellix (financial asst from manufacturer, Buspirone, Trazodone  Next financial assistance app order of Trintellix 02/2022

## 2022-02-08 ENCOUNTER — Encounter: Payer: Self-pay | Admitting: Family Medicine

## 2022-02-08 ENCOUNTER — Ambulatory Visit (INDEPENDENT_AMBULATORY_CARE_PROVIDER_SITE_OTHER): Payer: Medicare HMO | Admitting: Family Medicine

## 2022-02-08 VITALS — BP 108/67 | HR 76 | Ht 68.0 in | Wt 211.8 lb

## 2022-02-08 DIAGNOSIS — G8929 Other chronic pain: Secondary | ICD-10-CM | POA: Diagnosis not present

## 2022-02-08 DIAGNOSIS — M25512 Pain in left shoulder: Secondary | ICD-10-CM

## 2022-02-08 DIAGNOSIS — M25812 Other specified joint disorders, left shoulder: Secondary | ICD-10-CM | POA: Diagnosis not present

## 2022-02-08 DIAGNOSIS — Z9889 Other specified postprocedural states: Secondary | ICD-10-CM

## 2022-02-08 MED ORDER — METHYLPREDNISOLONE ACETATE 40 MG/ML IJ SUSP
40.0000 mg | Freq: Once | INTRAMUSCULAR | Status: AC
  Administered 2022-02-08: 40 mg via INTRA_ARTICULAR

## 2022-02-08 MED ORDER — LIDOCAINE HCL (PF) 1 % IJ SOLN
4.0000 mL | Freq: Once | INTRAMUSCULAR | Status: AC
  Administered 2022-02-08: 4 mL

## 2022-02-08 NOTE — Patient Instructions (Signed)
Thank you for coming to the office today.  You received a Left Shoulder Joint steroid injection today. - Lidocaine numbing medicine may ease the pain initially for a few hours until it wears off - As discussed, you may experience a "steroid flare" this evening or within 24-48 hours, anytime medicine is injected into an inflamed joint it can cause the pain to get worse temporarily - Everyone responds differently to these injections, it depends on the patient and the severity of the joint problem, it may provide anywhere from days to weeks, to months of relief. Ideal response is >6 months relief - Try to take it easy for next 1-2 days, avoid over activity and strain on joint (limit lifting for shoulder) - Recommend the following:   - For swelling - rest, compression sleeve / ACE wrap, elevation, and ice packs as needed for first few days   - For pain in future may use heating pad or moist heat as needed  Referral sent to [facility] [place]  Please schedule a Follow-up Appointment to: No follow-ups on file.  If you have any other questions or concerns, please feel free to call the office or send a message through [place]. You may also schedule an earlier appointment if necessary.  Additionally, you may be receiving a survey about your experience at our office within a few days to 1 week by e-mail or mail. We value your feedback.  Nobie Putnam, DO [place]

## 2022-02-08 NOTE — Progress Notes (Signed)
Subjective:    Patient ID: Jorge Jordan, male    DOB: 10-Oct-1960, 61 y.o.   MRN: [id number]  Jorge Jordan is a 61 y.o. male presenting on 02/08/2022 for left shoulder steroid injection   HPI  Left Shoulder Pain, Impingement S/p L Shoulder Rotator cuff surgery 04/03/2008 at [facility] He has seen other ortho [place] for other joints before, he wishes to return to [facility] now. He has had prior joint injections before, today here for shoulder injection He experiences pain in Left shoulder worse with range of motion, repetition, overhead activities He is familiar with prior rotator cuff injury before No new obvious trauma Gradual worsening now.     02/07/2022    9:02 AM 09/06/2021    1:31 PM 11/08/2020   10:38 AM  Depression screen PHQ 2/9  Decreased Interest 2 0 0  Down, Depressed, Hopeless 1 0 1  PHQ - 2 Score 3 0 1  Altered sleeping 2  2  Tired, decreased energy 1  1  Change in appetite 1  2  Feeling bad or failure about yourself  1  1  Trouble concentrating 0  1  Moving slowly or fidgety/restless 0  1  Suicidal thoughts 0  0  PHQ-9 Score 8  9  Difficult doing work/chores Not difficult at all  Somewhat difficult    Social History   Tobacco Use   Smoking status: Never   Smokeless tobacco: Never  Vaping Use   Vaping Use: Never used  Substance Use Topics   Alcohol use: Yes    Comment: occasionally   Drug use: No    Review of Systems Per HPI unless specifically indicated above     Objective:    BP 108/67   Pulse 76   Ht '5\' 8"'$  (1.727 m)   Wt 211 lb 12.8 oz (96.1 kg)   SpO2 99%   BMI 32.20 kg/m   Wt Readings from Last 3 Encounters:  02/08/22 211 lb 12.8 oz (96.1 kg)  02/07/22 210 lb 8 oz (95.5 kg)  05/01/21 212 lb (96.2 kg)    Physical Exam Vitals and nursing note reviewed.  Constitutional:      General: He is not in acute distress.    Appearance: Normal appearance. He is well-developed. He is not diaphoretic.     Comments:  Well-appearing, comfortable, cooperative  HENT:     Head: Normocephalic and atraumatic.  Eyes:     General:        Right eye: No discharge.        Left eye: No discharge.     Conjunctiva/sclera: Conjunctivae normal.  Cardiovascular:     Rate and Rhythm: Normal rate.  Pulmonary:     Effort: Pulmonary effort is normal.  Musculoskeletal:     Comments: LEFT Shoulder Inspection: Normal appearance bilateral symmetrical Palpation: Non-tender to palpation over anterior, lateral, or posterior shoulder  ROM: REDUCED ROM forward flex abduction Special Testing: Impingement positive Strength: Normal strength 5/5 flex/ext, ext rot / int rot, grip, rotator cuff str testing. Neurovascular: Distally intact pulses, sensation to light touch   Skin:    General: Skin is warm and dry.     Findings: No erythema or rash.  Neurological:     Mental Status: He is alert and oriented to person, place, and time.  Psychiatric:        Mood and Affect: Mood normal.        Behavior: Behavior normal.  Thought Content: Thought content normal.     Comments: Well groomed, good eye contact, normal speech and thoughts     ________________________________________________________ PROCEDURE NOTE Date: 02/08/22 Left Shoulder subacromial injection injection Discussed benefits and risks (including pain, bleeding, infection, steroid flare). Verbal consent given by patient. Medication:  1 cc Depo-medrol '40mg'$  and 4 cc Lidocaine 1% without epi Time Out taken  Landmarks identified. Area cleansed with alcohol wipes. Using 21 gauge and 1, 1/2 inch needle, Left subacromial bursa space was injected (with above listed medication) via posterior approach cold spray used for superficial anesthetic. Sterile bandage placed. Patient tolerated procedure well without bleeding or paresthesias. No complications.    Results for orders placed or performed during the hospital encounter of 04/20/21  CBC  Result Value Ref Range    WBC 4.1 4.0 - 10.5 K/uL   RBC 4.56 4.22 - 5.81 MIL/uL   Hemoglobin 15.6 13.0 - 17.0 g/dL   HCT 42.9 39.0 - 52.0 %   MCV 94.1 80.0 - 100.0 fL   MCH 34.2 (H) 26.0 - 34.0 pg   MCHC 36.4 (H) 30.0 - 36.0 g/dL   RDW 12.1 11.5 - 15.5 %   Platelets 224 150 - 400 K/uL   nRBC 0.0 0.0 - 0.2 %  Basic metabolic panel  Result Value Ref Range   Sodium 136 135 - 145 mmol/L   Potassium 3.4 (L) 3.5 - 5.1 mmol/L   Chloride 100 98 - 111 mmol/L   CO2 26 22 - 32 mmol/L   Glucose, Bld 159 (H) 70 - 99 mg/dL   BUN 18 6 - 20 mg/dL   Creatinine, Ser 1.10 0.61 - 1.24 mg/dL   Calcium 9.6 8.9 - 10.3 mg/dL   GFR, Estimated >60 >60 mL/min   Anion gap 10 5 - 15      Assessment & Plan:   Problem List Items Addressed This Visit   None Visit Diagnoses     Chronic left shoulder pain    -  Primary   Relevant Medications   lidocaine (PF) (XYLOCAINE) 1 % injection 4 mL (Completed)   methylPREDNISolone acetate (DEPO-MEDROL) injection 40 mg (Completed)   Other Relevant Orders   Ambulatory referral to Orthopedic Surgery   S/P left rotator cuff repair       Relevant Orders   Ambulatory referral to Orthopedic Surgery   Impingement of left shoulder       Relevant Medications   lidocaine (PF) (XYLOCAINE) 1 % injection 4 mL (Completed)   methylPREDNISolone acetate (DEPO-MEDROL) injection 40 mg (Completed)   Other Relevant Orders   Ambulatory referral to Orthopedic Surgery       Consistent with subacute on chronic LEFT-shoulder bursitis vs rotator cuff tendinopathy  left shoulder pain, chronic recurrent, prior s/p L rotator cuff surgery repair 2009, he has had worsening symptoms with return of L shoulder pain and impingement   No recent updated imaging.  Plan: Left shoulder subacromial steroid injection performed today, see procedure note for details.  Continue other med regimen today, may use topical or oral NSAID, Tylenol, relative rest, topical treatments  Referral to [facility], will defer  imaging to their office  Orders Placed This Encounter  Procedures   Ambulatory referral to Orthopedic Surgery    Referral Priority:   Routine    Referral Type:   Surgical    Referral Reason:   Specialty Services Required    Requested Specialty:   Orthopedic Surgery    Number of Visits Requested:   1  Meds ordered this encounter  Medications   lidocaine (PF) (XYLOCAINE) 1 % injection 4 mL   methylPREDNISolone acetate (DEPO-MEDROL) injection 40 mg      Follow up plan: Return if symptoms worsen or fail to improve.   Nobie Putnam, [facility] 02/08/2022, 11:39 AM

## 2022-02-08 NOTE — Addendum Note (Signed)
Addended by: Olin Hauser on: 02/08/2022 02:11 PM   Modules accepted: Level of Service

## 2022-03-13 DIAGNOSIS — M25512 Pain in left shoulder: Secondary | ICD-10-CM | POA: Diagnosis not present

## 2022-03-20 DIAGNOSIS — M25512 Pain in left shoulder: Secondary | ICD-10-CM | POA: Diagnosis not present

## 2022-03-25 DIAGNOSIS — M75122 Complete rotator cuff tear or rupture of left shoulder, not specified as traumatic: Secondary | ICD-10-CM | POA: Diagnosis not present

## 2022-04-10 ENCOUNTER — Other Ambulatory Visit: Payer: Self-pay | Admitting: Orthopedic Surgery

## 2022-04-10 ENCOUNTER — Encounter: Payer: Self-pay | Admitting: Orthopedic Surgery

## 2022-04-10 NOTE — H&P (Deleted)
  The note originally documented on this encounter has been moved the the encounter in which it belongs.

## 2022-04-10 NOTE — H&P (Signed)
NAME: Jorge Jordan MRN:   [id number] DOB:   1961-01-21     HISTORY AND PHYSICAL  CHIEF COMPLAINT:  left shoulder pain  HISTORY:   Jorge Jordan a 62 y.o. male  with left  Shoulder Pain Patient complains of left shoulder pain. The symptoms began several months ago. Aggravating factors: no known event. Pain is located between the neck and shoulder. Discomfort is described as aching. Symptoms are exacerbated by repetitive movements. Evaluation to date: MRI: abnormal rotator cuff repair . Therapy to date includes: rest, ice, avoidance of offending activity, OTC analgesics which are somewhat effective, home exercises which are somewhat effective, and physical therapy which was somewhat effective.   Plan for Left shoulder arthroscopy with rotator cuff repair.  PAST MEDICAL HISTORY:   Past Medical History:  Diagnosis Date   Allergy    Anxiety    Arthritis    Asthma    Foot deformity    foot arthrodesis L foot   Hyperlipidemia    Hypertension    Osteoporosis    PONV (postoperative nausea and vomiting)    Pre-diabetes    Shoulder bursitis    both    Tendinitis    elbow    PAST SURGICAL HISTORY:   Past Surgical History:  Procedure Laterality Date   COLONOSCOPY N/A 11/22/2020   Procedure: COLONOSCOPY;  Surgeon: Virgel Manifold, MD;  Location: [facility] END[facility];  Service: Endoscopy;  Laterality: N/A;   FOOT SURGERY Left 2001   Triple arthrodesis   KNEE ARTHROSCOPY WITH LATERAL MENISECTOMY Right 05/01/2021   Procedure: KNEE ARTHROSCOPY WITH LATERAL MENISECTOMY;  Surgeon: Hessie Knows, MD;  Location: [facility] ORS;  Service: Orthopedics;  Laterality: Right;   KNEE ARTHROSCOPY WITH MEDIAL MENISECTOMY Right 05/01/2021   Procedure: KNEE ARTHROSCOPY WITH MEDIAL MENISECTOMY;  Surgeon: Hessie Knows, MD;  Location: [facility] ORS;  Service: Orthopedics;  Laterality: Right;   QUADRICEPS TENDON REPAIR Right 07/14/2020   Procedure: REPAIR QUADRICEP TENDON;  Surgeon: Hessie Knows, MD;  Location: [facility]  ORS;  Service: Orthopedics;  Laterality: Right;   QUADRICEPS TENDON REPAIR Right 05/01/2021   Procedure: REPAIR QUADRICEP TENDON;  Surgeon: Hessie Knows, MD;  Location: [facility] ORS;  Service: Orthopedics;  Laterality: Right;   REPAIR QUADRICEPS/HAMSTRING MUSCLES Right 05/04/2020   Procedure: REPAIR QUADRICEPS/HAMSTRING MUSCLES;  Surgeon: Hessie Knows, MD;  Location: [facility] ORS;  Service: Orthopedics;  Laterality: Right;  RIGHT QUADRICEPS   ROTATOR CUFF REPAIR Left    SHOULDER FUSION SURGERY Left    WRIST SURGERY     ganglion cyst    MEDICATIONS:  (Not in a hospital admission)   ALLERGIES:  No Known Allergies  REVIEW OF SYSTEMS:   Negative except HPI  FAMILY HISTORY:   Family History  Problem Relation Age of Onset   Hyperlipidemia Mother    Hypertension Mother    Diabetes Mother    Depression Mother    Hypertension Father    Hyperlipidemia Father    Cancer Father        skin cancer   Hypertension Brother    Diabetes Maternal Grandmother    Heart attack Maternal Grandfather     SOCIAL HISTORY:   reports that he has never smoked. He has never used smokeless tobacco. He reports current alcohol use. He reports that he does not use drugs.  PHYSICAL EXAM:  General appearance: alert, cooperative, and no distress Neck: no JVD and supple, symmetrical, trachea midline Resp: clear to auscultation bilaterally Cardio: regular rate and rhythm, S1, S2 normal, no murmur, click,  rub or gallop GI: soft, non-tender; bowel sounds normal; no masses,  no organomegaly Extremities: extremities normal, atraumatic, no cyanosis or edema Pulses: 2+ and symmetric Skin: Skin color, texture, turgor normal. No rashes or lesions Neurologic: Alert and oriented X 3, normal strength and tone. Normal symmetric reflexes. Normal coordination and gait    LABORATORY STUDIES: No results for input(s): "WBC", "HGB", "HCT", "PLT" in the last 72 hours.  No results for input(s): "NA", "K", "CL", "CO2", "GLUCOSE",  "BUN", "CREATININE", "CALCIUM" in the last 72 hours.  STUDIES/RESULTS:  No results found.  ASSESSMENT:  Left shoulder impingement with rotator cuff tear        Active Problems:   * No active hospital problems. *    PLAN:   Left shoulder arthroscopy with rotator cuff repair   Carlynn Spry 04/10/2022. 8:12 PM

## 2022-04-15 ENCOUNTER — Other Ambulatory Visit: Payer: Self-pay

## 2022-04-15 ENCOUNTER — Encounter
Admission: RE | Admit: 2022-04-15 | Discharge: 2022-04-15 | Disposition: A | Discharge status: Home / Self Care | Payer: Medicare HMO | Source: Ambulatory Visit | Attending: Orthopedic Surgery | Admitting: Orthopedic Surgery

## 2022-04-15 VITALS — BP 132/82 | HR 86 | Resp 16 | Ht 68.0 in | Wt 213.0 lb

## 2022-04-15 DIAGNOSIS — Z01812 Encounter for preprocedural laboratory examination: Secondary | ICD-10-CM

## 2022-04-15 DIAGNOSIS — Z01818 Encounter for other preprocedural examination: Secondary | ICD-10-CM | POA: Diagnosis not present

## 2022-04-15 DIAGNOSIS — M75122 Complete rotator cuff tear or rupture of left shoulder, not specified as traumatic: Secondary | ICD-10-CM | POA: Diagnosis not present

## 2022-04-15 HISTORY — DX: Malignant (primary) neoplasm, unspecified: C80.1

## 2022-04-15 LAB — BASIC METABOLIC PANEL
Anion gap: 11 (ref 5–15)
BUN: 13 mg/dL (ref 8–23)
CO2: 24 mmol/L (ref 22–32)
Calcium: 9.2 mg/dL (ref 8.9–10.3)
Chloride: 99 mmol/L (ref 98–111)
Creatinine, Ser: 1.01 mg/dL (ref 0.61–1.24)
GFR, Estimated: >60 mL/min (ref >60)
Glucose, Bld: 139 mg/dL — ABNORMAL HIGH (ref 70–99)
Potassium: 3.4 mmol/L — ABNORMAL LOW (ref 3.5–5.1)
Sodium: 134 mmol/L — ABNORMAL LOW (ref 135–145)

## 2022-04-15 LAB — CBC
HCT: 43.3 % (ref 39.0–52.0)
Hemoglobin: 16 g/dL (ref 13.0–17.0)
MCH: 34.3 pg — ABNORMAL HIGH (ref 26.0–34.0)
MCHC: 37 g/dL — ABNORMAL HIGH (ref 30.0–36.0)
MCV: 92.9 fL (ref 80.0–100.0)
Platelets: 232 10*3/uL (ref 150–400)
RBC: 4.66 MIL/uL (ref 4.22–5.81)
RDW: 11.9 % (ref 11.5–15.5)
WBC: 3.9 10*3/uL — ABNORMAL LOW (ref 4.0–10.5)
nRBC: 0 % (ref 0.0–0.2)

## 2022-04-15 NOTE — Patient Instructions (Addendum)
Your procedure is scheduled on: 04/23/22 - Tuesday Report to the Registration Desk on the 1st floor of the [facility]. To find out your arrival time, please call [phone] between 1PM - 3PM on: 04/22/22 - Monday If your arrival time is 6:00 am, do not arrive prior to that time as the [place] entrance doors do not open until 6:00 am.  REMEMBER: Instructions that are not followed completely may result in serious medical risk, up to and including death; or upon the discretion of your surgeon and anesthesiologist your surgery may need to be rescheduled.  Do not eat food or drink any liquids after midnight the night before surgery.  No gum chewing, lozengers or hard candies.   TAKE ONLY THESE MEDICATIONS THE MORNING OF SURGERY WITH A SIP OF WATER:  - amLODipine (NORVASC)  - busPIRone (BUSPAR)  - pravastatin (PRAVACHOL)   HOLD ASPIRIN 7 days prior to your surgery.   One week prior to surgery: Stop Anti-inflammatories (NSAIDS) such as Advil, Aleve, Ibuprofen, Motrin, Naproxen, Naprosyn and Aspirin based products such as Excedrin, Goodys Powder, BC Powder.  Stop ANY OVER THE COUNTER supplements until after surgery.  You may however, continue to take Tylenol if needed for pain up until the day of surgery.  No Alcohol for 24 hours before or after surgery.  No Smoking including e-cigarettes for 24 hours prior to surgery.  No chewable tobacco products for at least 6 hours prior to surgery.  No nicotine patches on the day of surgery.  Do not use any "recreational" drugs for at least a week prior to your surgery.  Please be advised that the combination of cocaine and anesthesia may have negative outcomes, up to and including death. If you test positive for cocaine, your surgery will be cancelled.  On the morning of surgery brush your teeth with toothpaste and water, you may rinse your mouth with mouthwash if you wish. Do not swallow any toothpaste or mouthwash.  Use CHG Soap or  wipes as directed on instruction sheet.  Do not wear jewelry, make-up, hairpins, clips or nail polish.  Do not wear lotions, powders, or perfumes.   Do not shave body from the neck down 48 hours prior to surgery just in case you cut yourself which could leave a site for infection.  Also, freshly shaved skin may become irritated if using the CHG soap.  Contact lenses, hearing aids and dentures may not be worn into surgery.  Do not bring valuables to the hospital. [facility] is not responsible for any missing/lost belongings or valuables.   Notify your doctor if there is any change in your medical condition (cold, fever, infection).  Wear comfortable clothing (specific to your surgery type) to the hospital.  After surgery, you can help prevent lung complications by doing breathing exercises.  Take deep breaths and cough every 1-2 hours. Your doctor may order a device called an Incentive Spirometer to help you take deep breaths. When coughing or sneezing, hold a pillow firmly against your incision with both hands. This is called "splinting." Doing this helps protect your incision. It also decreases belly discomfort.  If you are being admitted to the hospital overnight, leave your suitcase in the car. After surgery it may be brought to your room.  If you are being discharged the day of surgery, you will not be allowed to drive home. You will need a responsible adult (18 years or older) to drive you home and stay with you that night.  If you are taking public transportation, you will need to have a responsible adult (18 years or older) with you. Please confirm with your physician that it is acceptable to use public transportation.   Please call the [place] [facility]. at [phone] if you have any questions about these instructions.  Surgery Visitation Policy:  Patients undergoing a surgery or procedure may have two family members or support persons with them as long as  the person is not COVID-19 positive or experiencing its symptoms.   Inpatient Visitation:    Visiting hours are 7 a.m. to 8 p.m. Up to four visitors are allowed at one time in a patient room. The visitors may rotate out with other people during the day. One designated support person (adult) may remain overnight.  Due to an increase in RSV and influenza rates and associated hospitalizations, children ages 46 and under will not be able to visit patients in [facility]. Masks continue to be strongly recommended.

## 2022-04-22 MED ORDER — CHLORHEXIDINE GLUCONATE 0.12 % MT SOLN: 15.0000 mL | Freq: Once | OROMUCOSAL | Status: AC

## 2022-04-22 MED ORDER — CEFAZOLIN SODIUM-DEXTROSE 2-4 GM/100ML-% IV SOLN
2.0000 g | INTRAVENOUS | Status: AC
  Administered 2022-04-23: 2 g via INTRAVENOUS

## 2022-04-22 MED ORDER — ORAL CARE MOUTH RINSE: 15.0000 mL | Freq: Once | OROMUCOSAL | Status: AC

## 2022-04-22 MED ORDER — LACTATED RINGERS IV SOLN: INTRAVENOUS | Status: DC

## 2022-04-22 MED ORDER — FAMOTIDINE 20 MG PO TABS: 20.0000 mg | ORAL_TABLET | Freq: Once | ORAL | Status: AC

## 2022-04-23 ENCOUNTER — Other Ambulatory Visit: Payer: Self-pay

## 2022-04-23 ENCOUNTER — Ambulatory Visit: Payer: Medicare HMO | Admitting: Urgent Care

## 2022-04-23 ENCOUNTER — Ambulatory Visit: Payer: Medicare HMO

## 2022-04-23 ENCOUNTER — Ambulatory Visit: Payer: Medicare HMO | Admitting: Certified Registered"

## 2022-04-23 ENCOUNTER — Ambulatory Visit
Admission: RE | Admit: 2022-04-23 | Discharge: 2022-04-23 | Disposition: A | Discharge status: Home / Self Care | Payer: Medicare HMO | Source: Ambulatory Visit | Attending: Orthopedic Surgery | Admitting: Orthopedic Surgery

## 2022-04-23 ENCOUNTER — Encounter: Payer: Self-pay | Admitting: Orthopedic Surgery

## 2022-04-23 ENCOUNTER — Encounter
Admission: RE | Disposition: A | Discharge status: Home / Self Care | Payer: Self-pay | Source: Ambulatory Visit | Attending: Orthopedic Surgery

## 2022-04-23 DIAGNOSIS — M24112 Other articular cartilage disorders, left shoulder: Secondary | ICD-10-CM | POA: Diagnosis not present

## 2022-04-23 DIAGNOSIS — M7552 Bursitis of left shoulder: Secondary | ICD-10-CM | POA: Diagnosis not present

## 2022-04-23 DIAGNOSIS — G8918 Other acute postprocedural pain: Secondary | ICD-10-CM | POA: Diagnosis not present

## 2022-04-23 DIAGNOSIS — M75122 Complete rotator cuff tear or rupture of left shoulder, not specified as traumatic: Secondary | ICD-10-CM | POA: Diagnosis not present

## 2022-04-23 DIAGNOSIS — M19012 Primary osteoarthritis, left shoulder: Secondary | ICD-10-CM | POA: Diagnosis not present

## 2022-04-23 DIAGNOSIS — J45909 Unspecified asthma, uncomplicated: Secondary | ICD-10-CM | POA: Diagnosis not present

## 2022-04-23 DIAGNOSIS — I10 Essential (primary) hypertension: Secondary | ICD-10-CM | POA: Diagnosis not present

## 2022-04-23 HISTORY — PX: ARTHOSCOPIC ROTAOR CUFF REPAIR: SHX5002

## 2022-04-23 HISTORY — PX: SHOULDER ARTHROSCOPY: SHX128

## 2022-04-23 SURGERY — ARTHROSCOPY, SHOULDER
Anesthesia: Regional | Site: Shoulder | Laterality: Left

## 2022-04-23 MED ORDER — SUGAMMADEX SODIUM 200 MG/2ML IV SOLN
INTRAVENOUS | Status: DC | PRN
  Administered 2022-04-23: 200 mg via INTRAVENOUS

## 2022-04-23 MED ORDER — PROPOFOL 1000 MG/100ML IV EMUL
INTRAVENOUS | Status: AC
  Filled 2022-04-23: qty 100

## 2022-04-23 MED ORDER — PROPOFOL 10 MG/ML IV BOLUS
INTRAVENOUS | Status: DC | PRN
  Administered 2022-04-23: 150 mg via INTRAVENOUS
  Administered 2022-04-23: 140 ug/kg/min via INTRAVENOUS

## 2022-04-23 MED ORDER — ROCURONIUM BROMIDE 10 MG/ML (PF) SYRINGE
PREFILLED_SYRINGE | INTRAVENOUS | Status: AC
  Filled 2022-04-23: qty 10

## 2022-04-23 MED ORDER — DEXAMETHASONE SODIUM PHOSPHATE 10 MG/ML IJ SOLN
INTRAMUSCULAR | Status: DC | PRN
  Administered 2022-04-23: 10 mg via INTRAVENOUS

## 2022-04-23 MED ORDER — BUPIVACAINE LIPOSOME 1.3 % IJ SUSP
INTRAMUSCULAR | Status: AC
  Filled 2022-04-23: qty 20

## 2022-04-23 MED ORDER — BUPIVACAINE HCL (PF) 0.5 % IJ SOLN
INTRAMUSCULAR | Status: AC
  Filled 2022-04-23: qty 10

## 2022-04-23 MED ORDER — FENTANYL CITRATE (PF) 100 MCG/2ML IJ SOLN: 25.0000 ug | INTRAMUSCULAR | Status: DC | PRN

## 2022-04-23 MED ORDER — LACTATED RINGERS IR SOLN
Status: DC | PRN
  Administered 2022-04-23 (×3): 3000 mL

## 2022-04-23 MED ORDER — FAMOTIDINE 20 MG PO TABS
ORAL_TABLET | ORAL | Status: AC
  Administered 2022-04-23: 20 mg via ORAL
  Filled 2022-04-23: qty 1

## 2022-04-23 MED ORDER — OXYCODONE HCL 5 MG/5ML PO SOLN: 5.0000 mg | Freq: Once | ORAL | Status: AC | PRN

## 2022-04-23 MED ORDER — KETOROLAC TROMETHAMINE 15 MG/ML IJ SOLN
INTRAMUSCULAR | Status: AC
  Administered 2022-04-23: 15 mg via INTRAVENOUS
  Filled 2022-04-23: qty 1

## 2022-04-23 MED ORDER — ACETAMINOPHEN 10 MG/ML IV SOLN
INTRAVENOUS | Status: AC
  Filled 2022-04-23: qty 100

## 2022-04-23 MED ORDER — OXYCODONE HCL 5 MG PO TABS
5.0000 mg | ORAL_TABLET | Freq: Once | ORAL | Status: AC | PRN
  Administered 2022-04-23: 5 mg via ORAL

## 2022-04-23 MED ORDER — PHENYLEPHRINE HCL (PRESSORS) 10 MG/ML IV SOLN
INTRAVENOUS | Status: DC | PRN
  Administered 2022-04-23 (×3): 80 ug via INTRAVENOUS
  Administered 2022-04-23: 160 ug via INTRAVENOUS
  Administered 2022-04-23: 80 ug via INTRAVENOUS
  Administered 2022-04-23 (×2): 160 ug via INTRAVENOUS

## 2022-04-23 MED ORDER — DEXAMETHASONE SODIUM PHOSPHATE 10 MG/ML IJ SOLN
INTRAMUSCULAR | Status: AC
  Filled 2022-04-23: qty 1

## 2022-04-23 MED ORDER — PHENYLEPHRINE 80 MCG/ML (10ML) SYRINGE FOR IV PUSH (FOR BLOOD PRESSURE SUPPORT)
PREFILLED_SYRINGE | INTRAVENOUS | Status: AC
  Filled 2022-04-23: qty 10

## 2022-04-23 MED ORDER — ROCURONIUM BROMIDE 100 MG/10ML IV SOLN
INTRAVENOUS | Status: DC | PRN
  Administered 2022-04-23: 10 mg via INTRAVENOUS
  Administered 2022-04-23: 60 mg via INTRAVENOUS
  Administered 2022-04-23: 30 mg via INTRAVENOUS

## 2022-04-23 MED ORDER — FENTANYL CITRATE PF 50 MCG/ML IJ SOSY: 50.0000 ug | PREFILLED_SYRINGE | Freq: Once | INTRAMUSCULAR | Status: AC

## 2022-04-23 MED ORDER — LIDOCAINE HCL (CARDIAC) PF 100 MG/5ML IV SOSY
PREFILLED_SYRINGE | INTRAVENOUS | Status: DC | PRN
  Administered 2022-04-23: 100 mg via INTRAVENOUS

## 2022-04-23 MED ORDER — HYDROCODONE-ACETAMINOPHEN 7.5-325 MG PO TABS
ORAL_TABLET | ORAL | Status: AC
  Filled 2022-04-23: qty 1

## 2022-04-23 MED ORDER — CHLORHEXIDINE GLUCONATE 0.12 % MT SOLN
OROMUCOSAL | Status: AC
  Administered 2022-04-23: 15 mL via OROMUCOSAL
  Filled 2022-04-23: qty 15

## 2022-04-23 MED ORDER — CEFAZOLIN SODIUM-DEXTROSE 2-4 GM/100ML-% IV SOLN
INTRAVENOUS | Status: AC
  Filled 2022-04-23: qty 100

## 2022-04-23 MED ORDER — OXYCODONE HCL 5 MG PO TABS
ORAL_TABLET | ORAL | Status: AC
  Filled 2022-04-23: qty 1

## 2022-04-23 MED ORDER — KETOROLAC TROMETHAMINE 15 MG/ML IJ SOLN: 15.0000 mg | Freq: Four times a day (QID) | INTRAMUSCULAR | Status: DC

## 2022-04-23 MED ORDER — LACTATED RINGERS IR SOLN
Status: DC | PRN
  Administered 2022-04-23 (×3): 3001 mL

## 2022-04-23 MED ORDER — SEVOFLURANE IN SOLN
RESPIRATORY_TRACT | Status: AC
  Filled 2022-04-23: qty 250

## 2022-04-23 MED ORDER — BUPIVACAINE HCL (PF) 0.5 % IJ SOLN
INTRAMUSCULAR | Status: DC | PRN
  Administered 2022-04-23: 10 mL

## 2022-04-23 MED ORDER — SCOPOLAMINE 1 MG/3DAYS TD PT72
MEDICATED_PATCH | TRANSDERMAL | Status: AC
  Filled 2022-04-23: qty 1

## 2022-04-23 MED ORDER — ONDANSETRON HCL 4 MG/2ML IJ SOLN: 4.0000 mg | Freq: Once | INTRAMUSCULAR | Status: DC | PRN

## 2022-04-23 MED ORDER — ACETAMINOPHEN 10 MG/ML IV SOLN
INTRAVENOUS | Status: DC | PRN
  Administered 2022-04-23: 1000 mg via INTRAVENOUS

## 2022-04-23 MED ORDER — ONDANSETRON HCL 4 MG/2ML IJ SOLN
INTRAMUSCULAR | Status: DC | PRN
  Administered 2022-04-23: 4 mg via INTRAVENOUS

## 2022-04-23 MED ORDER — MIDAZOLAM HCL 2 MG/2ML IJ SOLN
INTRAMUSCULAR | Status: AC
  Administered 2022-04-23: 2 mg via INTRAVENOUS
  Filled 2022-04-23: qty 2

## 2022-04-23 MED ORDER — PROPOFOL 10 MG/ML IV BOLUS
INTRAVENOUS | Status: AC
  Filled 2022-04-23: qty 20

## 2022-04-23 MED ORDER — BUPIVACAINE LIPOSOME 1.3 % IJ SUSP
INTRAMUSCULAR | Status: DC | PRN
  Administered 2022-04-23: 20 mL

## 2022-04-23 MED ORDER — SCOPOLAMINE 1 MG/3DAYS TD PT72: 1.0000 | MEDICATED_PATCH | TRANSDERMAL | Status: DC

## 2022-04-23 MED ORDER — HYDROCODONE-ACETAMINOPHEN 7.5-325 MG PO TABS: 1.0000 | ORAL_TABLET | ORAL | Status: DC | PRN

## 2022-04-23 MED ORDER — LACTATED RINGERS IV SOLN: INTRAVENOUS | Status: DC

## 2022-04-23 MED ORDER — FENTANYL CITRATE (PF) 100 MCG/2ML IJ SOLN
INTRAMUSCULAR | Status: AC
  Filled 2022-04-23: qty 2

## 2022-04-23 MED ORDER — ONDANSETRON HCL 4 MG/2ML IJ SOLN
INTRAMUSCULAR | Status: AC
  Filled 2022-04-23: qty 2

## 2022-04-23 MED ORDER — FENTANYL CITRATE (PF) 100 MCG/2ML IJ SOLN
INTRAMUSCULAR | Status: DC | PRN
  Administered 2022-04-23 (×2): 50 ug via INTRAVENOUS

## 2022-04-23 MED ORDER — LIDOCAINE HCL (PF) 2 % IJ SOLN
INTRAMUSCULAR | Status: AC
  Filled 2022-04-23: qty 5

## 2022-04-23 MED ORDER — MIDAZOLAM HCL 2 MG/2ML IJ SOLN: 2.0000 mg | Freq: Once | INTRAMUSCULAR | Status: AC

## 2022-04-23 MED ORDER — FENTANYL CITRATE PF 50 MCG/ML IJ SOSY
PREFILLED_SYRINGE | INTRAMUSCULAR | Status: AC
  Administered 2022-04-23: 50 ug via INTRAVENOUS
  Filled 2022-04-23: qty 1

## 2022-04-23 MED ORDER — ACETAMINOPHEN 10 MG/ML IV SOLN: 1000.0000 mg | Freq: Once | INTRAVENOUS | Status: DC | PRN

## 2022-04-23 SURGICAL SUPPLY — 76 items
ADAPTER IRRIG TUBE 2 SPIKE SOL (ADAPTER) ×2 IMPLANT
ADPR TBG 2 SPK PMP STRL ASCP (ADAPTER) ×1
ANCH SUT 2 TPE SLF PNCH BLK (Anchor) ×1 IMPLANT
ANCH SUT 2 TPE SLF PNCH BLU (Anchor) ×1 IMPLANT
ANCH SUT STRL LF DISP ARGO (Anchor) ×2 IMPLANT
ANCHOR ARGO 4.75 1 RIBBON (Anchor) IMPLANT
ANCHOR SUT BIOCOMP LK 2.9X12.5 (Anchor) ×1 IMPLANT
ANCHOR YKNOT PRO RC BLUE TAPE (Anchor) IMPLANT
ANCHOR YKNOT PRO RC HI-FI TAPE (Anchor) IMPLANT
APL PRP STRL LF DISP 70% ISPRP (MISCELLANEOUS) ×1
BLADE INCISOR PLUS 4.5 (BLADE) ×1 IMPLANT
BLADE SHAVER 4.5X7 STR FR (MISCELLANEOUS) IMPLANT
BRUSH SCRUB EZ  4% CHG (MISCELLANEOUS) ×1
BRUSH SCRUB EZ 4% CHG (MISCELLANEOUS) ×1 IMPLANT
BUR ACROMIONIZER 4.0 (BURR) IMPLANT
BUR BR 5.5 WIDE MOUTH (BURR) IMPLANT
CANNULA 5.75X7 CRYSTAL CLEAR (CANNULA) ×1 IMPLANT
CANNULA PARTIAL THREAD 2X7 (CANNULA) IMPLANT
CANNULA SHOULDER 7CM (CANNULA) ×1 IMPLANT
CANNULA TWIST IN 8.25X7CM (CANNULA) IMPLANT
CANNULA TWIST IN 8.25X9CM (CANNULA) ×1 IMPLANT
CHLORAPREP W/TINT 26 (MISCELLANEOUS) ×1 IMPLANT
COOLER POLAR GLACIER W/PUMP (MISCELLANEOUS) ×1 IMPLANT
DEVICE SUCT BLK HOLE OR FLOOR (MISCELLANEOUS) IMPLANT
DRAPE 3/4 80X56 (DRAPES) ×1 IMPLANT
DRAPE INCISE IOBAN 66X45 STRL (DRAPES) ×1 IMPLANT
DRAPE ORTHO SPLIT 77X108 STRL (DRAPES) ×2
DRAPE STERI 35X30 U-POUCH (DRAPES) ×1 IMPLANT
DRAPE SURG ORHT 6 SPLT 77X108 (DRAPES) ×2 IMPLANT
DRAPE U-SHAPE 47X51 STRL (DRAPES) ×1 IMPLANT
ELECT REM PT RETURN 9FT ADLT (ELECTROSURGICAL)
ELECTRODE REM PT RTRN 9FT ADLT (ELECTROSURGICAL) IMPLANT
GAUZE 4X4 16PLY ~~LOC~~+RFID DBL (SPONGE) ×1 IMPLANT
GAUZE SPONGE 4X4 12PLY STRL (GAUZE/BANDAGES/DRESSINGS) ×1 IMPLANT
GAUZE XEROFORM 1X8 LF (GAUZE/BANDAGES/DRESSINGS) ×1 IMPLANT
GLOVE SURG ORTHO 8.5 STRL (GLOVE) ×1 IMPLANT
GOWN STRL REUS W/ TWL LRG LVL3 (GOWN DISPOSABLE) ×1 IMPLANT
GOWN STRL REUS W/ TWL XL LVL3 (GOWN DISPOSABLE) ×1 IMPLANT
GOWN STRL REUS W/TWL LRG LVL3 (GOWN DISPOSABLE) ×1
GOWN STRL REUS W/TWL XL LVL3 (GOWN DISPOSABLE) ×1
IV LACTATED RINGER IRRG 3000ML (IV SOLUTION) ×6
IV LR IRRIG 3000ML ARTHROMATIC (IV SOLUTION) ×4 IMPLANT
KIT INSERTION 2.9 PUSHLOCK (KITS) ×1 IMPLANT
KIT STABILIZATION SHOULDER (MISCELLANEOUS) ×1 IMPLANT
KIT TURNOVER KIT A (KITS) ×1 IMPLANT
LASSO 25 DEG RIGHT QUICKPASS (SUTURE) IMPLANT
LASSO 45 DEG LEFT QUICKPASS (SUTURE) IMPLANT
MANIFOLD NEPTUNE II (INSTRUMENTS) ×3 IMPLANT
MASK FACE SPIDER DISP (MASK) ×1 IMPLANT
MAT ABSORB  FLUID 56X50 GRAY (MISCELLANEOUS) ×1
MAT ABSORB FLUID 56X50 GRAY (MISCELLANEOUS) ×1 IMPLANT
NDL SAFETY ECLIP 18X1.5 (MISCELLANEOUS) ×1 IMPLANT
NDL SCORPION MULTI FIRE (NEEDLE) IMPLANT
NDL SPNL 18GX3.5 QUINCKE PK (NEEDLE) ×1 IMPLANT
NEEDLE HYPO 22GX1.5 SAFETY (NEEDLE) ×1 IMPLANT
NEEDLE SCORPION MULTI FIRE (NEEDLE) ×1 IMPLANT
NEEDLE SPNL 18GX3.5 QUINCKE PK (NEEDLE) ×1 IMPLANT
PACK ARTHROSCOPY SHOULDER (MISCELLANEOUS) ×1 IMPLANT
PAD ABD DERMACEA PRESS 5X9 (GAUZE/BANDAGES/DRESSINGS) IMPLANT
PAD ARMBOARD 7.5X6 YLW CONV (MISCELLANEOUS) ×2 IMPLANT
PAD WRAPON POLAR SHDR XLG (MISCELLANEOUS) ×1 IMPLANT
SHEATH SHORT HANDLE 4.0 (SHEATH) IMPLANT
SLEEVE REMOTE CONTROL 5X12 (DRAPES) IMPLANT
SPONGE T-LAP 18X18 ~~LOC~~+RFID (SPONGE) ×1 IMPLANT
STRAP SAFETY 5IN WIDE (MISCELLANEOUS) ×1 IMPLANT
STRIP CLOSURE SKIN 1/4X4 (GAUZE/BANDAGES/DRESSINGS) IMPLANT
SUT ETHILON NAB PS2 4-0 18IN (SUTURE) ×1 IMPLANT
SYR 10ML LL (SYRINGE) ×1 IMPLANT
TAPE MICROFOAM 4IN (TAPE) ×1 IMPLANT
TRAP FLUID SMOKE EVACUATOR (MISCELLANEOUS) ×1 IMPLANT
TUBING CONNECTING 10 (TUBING) ×1 IMPLANT
TUBING INFLOW SET DBFLO PUMP (TUBING) ×1 IMPLANT
TUBING OUTFLOW SET DBLFO PUMP (TUBING) ×1 IMPLANT
WAND WEREWOLF FLOW 90D (MISCELLANEOUS) ×1 IMPLANT
WATER STERILE IRR 500ML POUR (IV SOLUTION) ×1 IMPLANT
WRAPON POLAR PAD SHDR XLG (MISCELLANEOUS) ×1

## 2022-04-23 NOTE — Anesthesia Procedure Notes (Signed)
Procedure Name: Intubation Date/Time: 04/23/2022 10:18 AM  Performed by: Cammie Sickle, CRNAPre-anesthesia Checklist: Patient identified, Patient being monitored, Timeout performed, Emergency Drugs available and Suction available Patient Re-evaluated:Patient Re-evaluated prior to induction Oxygen Delivery Method: Circle system utilized Preoxygenation: Pre-oxygenation with 100% oxygen Induction Type: IV induction Ventilation: Two handed mask ventilation required, Oral airway inserted - appropriate to patient size and Mask ventilation without difficulty Laryngoscope Size: 3 and McGraph Grade View: Grade I Tube type: Oral Tube size: 7.5 mm Number of attempts: 1 Airway Equipment and Method: Stylet Placement Confirmation: ETT inserted through vocal cords under direct vision, positive ETCO2 and breath sounds checked- equal and bilateral Secured at: 22 cm Tube secured with: Tape Dental Injury: Teeth and Oropharynx as per pre-operative assessment

## 2022-04-23 NOTE — Anesthesia Postprocedure Evaluation (Signed)
Anesthesia Post Note  Patient: Jorge Jordan  Procedure(s) Performed: ARTHROSCOPY SHOULDER (Left: Shoulder) ARTHROSCOPIC ROTATOR CUFF REPAIR (Left: Shoulder)  Patient location during evaluation: PACU Anesthesia Type: Regional Level of consciousness: awake and alert, oriented and patient cooperative Pain management: pain level controlled Vital Signs Assessment: post-procedure vital signs reviewed and stable Respiratory status: spontaneous breathing, nonlabored ventilation and respiratory function stable Cardiovascular status: blood pressure returned to baseline and stable Postop Assessment: adequate PO intake Anesthetic complications: no   No notable events documented.   Last Vitals:  Vitals:   04/23/22 1330 04/23/22 1355  BP: (!) 136/90 (!) 147/94  Pulse: 88 95  Resp: 20 17  Temp: 36.7 C 36.4 C  SpO2: 96% 98%    Last Pain:  Vitals:   04/23/22 1355  TempSrc: Temporal  PainSc: 0-No pain                 Darrin Nipper

## 2022-04-23 NOTE — Anesthesia Preprocedure Evaluation (Addendum)
Anesthesia Evaluation  Patient identified by MRN, date of birth, ID band Patient awake    Reviewed: Allergy & Precautions, NPO status , Patient's Chart, lab work & pertinent test results  History of Anesthesia Complications (+) PONV and history of anesthetic complications  Airway Mallampati: IV   Neck ROM: Full    Dental  (+) Missing   Pulmonary asthma    Pulmonary exam normal breath sounds clear to auscultation       Cardiovascular hypertension, Normal cardiovascular exam Rhythm:Regular Rate:Normal  ECG 04/15/22: normal   Neuro/Psych  PSYCHIATRIC DISORDERS (PTSD) Anxiety Depression    negative neurological ROS     GI/Hepatic negative GI ROS,,,  Endo/Other  negative endocrine ROS    Renal/GU negative Renal ROS     Musculoskeletal  (+) Arthritis ,    Abdominal   Peds  Hematology negative hematology ROS (+)   Anesthesia Other Findings   Reproductive/Obstetrics                             Anesthesia Physical Anesthesia Plan  ASA: 2  Anesthesia Plan: General and Regional   Post-op Pain Management: Regional block*   Induction: Intravenous  PONV Risk Score and Plan: 3 and Ondansetron, Dexamethasone, Treatment may vary due to age or medical condition, Scopolamine patch - Pre-op and TIVA  Airway Management Planned: Oral ETT  Additional Equipment:   Intra-op Plan:   Post-operative Plan: Extubation in OR  Informed Consent: I have reviewed the patients History and Physical, chart, labs and discussed the procedure including the risks, benefits and alternatives for the proposed anesthesia with the patient or authorized representative who has indicated his/her understanding and acceptance.     Dental advisory given  Plan Discussed with: CRNA  Anesthesia Plan Comments: (Plan for preoperative interscalene nerve block and GETA.  Patient consented for risks of anesthesia including but not  limited to:  - adverse reactions to medications - damage to eyes, teeth, lips or other oral mucosa - nerve damage due to positioning  - sore throat or hoarseness - damage to heart, brain, nerves, lungs, other parts of body or loss of life  Informed patient about role of CRNA in peri- and intra-operative care.  Patient voiced understanding.)        Anesthesia Quick Evaluation

## 2022-04-23 NOTE — Anesthesia Procedure Notes (Signed)
Anesthesia Regional Block: Interscalene brachial plexus block   Pre-Anesthetic Checklist: , timeout performed,  Correct Patient, Correct Site, Correct Laterality,  Correct Procedure,, risks and benefits discussed,  Surgical consent,  Pre-op evaluation,  At surgeon's request and post-op pain management  Laterality: Left  Prep: chloraprep       Needles:  Injection technique: Single-shot  Needle Type: Echogenic Needle          Additional Needles:   Procedures:,,,, ultrasound used (permanent image in chart),,   Motor weakness within 20 minutes.  Narrative:  Start time: 04/23/2022 9:03 AM End time: 04/23/2022 9:06 AM Injection made incrementally with aspirations every 5 mL.  Performed by: Personally  Anesthesiologist: Darrin Nipper, MD  Additional Notes: Functioning IV was confirmed and monitors applied.  Sterile prep and drape, hand hygiene and sterile gloves were used. Ultrasound guidance: relevant anatomy identified, needle position confirmed, local anesthetic spread visualized around nerve(s), vascular puncture avoided.  Image saved to electronic medical record.  Negative aspiration prior to incremental administration of local anesthetic for total 20 ml Exparel and 10 ml bupivacaine 0.5% given in interscalene distribution. The patient tolerated the procedure well. Vital signs and moderate sedation medications recorded in RN notes.

## 2022-04-23 NOTE — Discharge Instructions (Addendum)
Wear sling at all times, including sleep.  You will need to use the sling for a total of 4 weeks following surgery.  Do not try and lift your arm up or away from your body for any reason.   Keep the dressing dry.  You may remove bandage in 3 days.  You may place Band-Aids over top of the incisions.  May shower once dressing is removed in 3 days.  Remove sling carefully only for showers, leaving arm down by your side while in the shower.  +++ Make sure to take some pain medication this evening before you fall asleep, in preparation for the nerve block wearing off in the middle of the night.  If the the pain medication causes itching, or is too strong, try taking a single tablet at a time, or combining with Benadryl.  You may be most comfortable sleeping in a recliner.  If you do sleep in near bed, placed pillows behind the shoulder that have the operation to support it.    AMBULATORY SURGERY  DISCHARGE INSTRUCTIONS   The drugs that you were given will stay in your system until tomorrow so for the next 24 hours you should not:  Drive an automobile Make any legal decisions Drink any alcoholic beverage  You may resume regular meals tomorrow.  Today it is better to start with liquids and gradually work up to solid foods.  You may eat anything you prefer, but it is better to start with liquids, then soup and crackers, and gradually work up to solid foods.  Please notify your doctor immediately if you have any unusual bleeding, trouble breathing, redness and pain at the surgery site, drainage, fever, or pain not relieved by medication.  Additional Instructions:  Please contact your physician with any problems or Same Day Surgery at [phone], Monday through Friday 6 am to 4 pm, or [place] [facility] number at [phone].         POLAR CARE INFORMATION  [email or web address]  How to use Adel Cold Therapy System?  YouTube    [email or web address]  OPERATING INSTRUCTIONS  Start the product With dry hands, connect the transformer to the electrical connection located on the top of the cooler. Next, plug the transformer into an appropriate electrical outlet. The unit will automatically start running at this point.  To stop the pump, disconnect electrical power.  Unplug to stop the product when not in use. Unplugging the Polar Care unit turns it off. Always unplug immediately after use. Never leave it plugged in while unattended. Remove pad.    FIRST ADD WATER TO FILL LINE, THEN ICE---Replace ice when existing ice is almost melted  1 Discuss Treatment with your [place] Practitioner and Use Only as Prescribed 2 Apply Insulation Barrier & Cold Therapy Pad 3 Check for Moisture 4 Inspect Skin Regularly  Tips and Trouble Shooting Usage Tips 1. Use cubed or chunked ice for optimal performance. 2. It is recommended to drain the Pad between uses. To drain the pad, hold the Pad upright with the hose pointed toward the ground. Depress the black plunger and allow water to drain out. 3. You may disconnect the Pad from the unit without removing the pad from the affected area by depressing the silver tabs on the hose coupling and gently pulling the hoses apart. The Pad and unit will seal itself and will not leak. Note: Some dripping during release is normal. 4. DO NOT RUN PUMP WITHOUT WATER! The  pump in this unit is designed to run with water. Running the unit without water will cause permanent damage to the pump. 5. Unplug unit before removing lid.  TROUBLESHOOTING GUIDE Pump not running, Water not flowing to the pad, Pad is not getting cold 1. Make sure the transformer is plugged into the wall outlet. 2. Confirm that the ice and water are filled to the indicated levels. 3. Make sure there are no kinks in the pad. 4. Gently pull on the blue tube to make sure the tube/pad junction is  straight. 5. Remove the pad from the treatment site and ll it while the pad is lying at; then reapply. 6. Confirm that the pad couplings are securely attached to the unit. Listen for the double clicks (Figure 1) to confirm the pad couplings are securely attached.  Leaks    Note: Some condensation on the lines, controller, and pads is unavoidable, especially in warmer climates. 1. If using a Breg Polar Care Cold Therapy unit with a detachable Cold Therapy Pad, and a leak exists (other than condensation on the lines) disconnect the pad couplings. Make sure the silver tabs on the couplings are depressed before reconnecting the pad to the pump hose; then confirm both sides of the coupling are properly clicked in. 2. If the coupling continues to leak or a leak is detected in the pad itself, stop using it and call [facility] at [phone].  Cleaning After use, empty and dry the unit with a soft cloth. Warm water and mild detergent may be used occasionally to clean the pump and tubes.  WARNING: The Woodman can be cold enough to cause serious injury, including full skin necrosis. Follow these Operating Instructions, and carefully read the Product Insert (see pouch on side of unit) and the Cold Therapy Pad Fitting Instructions (provided with each Cold Therapy Pad) prior to use.     Interscalene Nerve Block with Exparel   For your surgery you have received an Interscalene Nerve Block with Exparel. Nerve Blocks affect many types of nerves, including nerves that control movement, pain and normal sensation.  You may experience feelings such as numbness, tingling, heaviness, weakness or the inability to move your arm or the feeling or sensation that your arm has "fallen asleep". A nerve block with Exparel can last up to 5 days.  Usually the weakness wears off first.  The tingling and heaviness usually wear off next.  Finally you may start to notice pain.  Keep in mind that this may occur in  any order.  Once a nerve block starts to wear off it is usually completely gone within 60 minutes. ISNB may cause mild shortness of breath, a hoarse voice, blurry vision, unequal pupils, or drooping of the face on the same side as the nerve block.  These symptoms will usually resolve with the numbness.  Very rarely the procedure itself can cause mild seizures. If needed, your surgeon will give you a prescription for pain medication.  It will take about 60 minutes for the oral pain medication to become fully effective.  So, it is recommended that you start taking this medication before the nerve block first begins to wear off, or when you first begin to feel discomfort. Take your pain medication only as prescribed.  Pain medication can cause sedation and decrease your breathing if you take more than you need for the level of pain that you have. Nausea is a common side effect of many pain  medications.  You may want to eat something before taking your pain medicine to prevent nausea. After an Interscalene nerve block, you cannot feel pain, pressure or extremes in temperature in the effected arm.  Because your arm is numb it is at an increased risk for injury.  To decrease the possibility of injury, please practice the following:  While you are awake change the position of your arm frequently to prevent too much pressure on any one area for prolonged periods of time.  If you have a cast or tight dressing, check the color or your fingers every couple of hours.  Call your surgeon with the appearance of any discoloration (white or blue). If you are given a sling to wear before you go home, please wear it  at all times until the block has completely worn off.  Do not get up at night without your sling. Please contact [facility] or your surgeon if you do not begin to regain sensation after 7 days from the surgery.  Anesthesia may be contacted by calling the Same Day Surgery Department, Mon. through Fri., 6 am  to 4 pm at [phone].   If you experience any other problems or concerns, please contact your surgeon's office. If you experience severe or prolonged shortness of breath go to the nearest emergency department.

## 2022-04-23 NOTE — Op Note (Signed)
04/23/2022  12:14 PM  PATIENT:  Jorge Jordan  62 y.o. male  PRE-OPERATIVE DIAGNOSIS:  M75.122 Complete rotatr-cuff tear/ruptr of left shoulder, not trauma  POST-OPERATIVE DIAGNOSIS:  M75.122 Complete rotatr-cuff tear/ruptr of left shoulder, not trauma  PROCEDURE:  Procedure(s): ARTHROSCOPY SHOULDER (Left) ARTHROSCOPIC ROTATOR CUFF REPAIR (Left)  SURGEON:  Surgeon(s) and Role:    Lovell Sheehan, MD - Primary  ASSIST: Carlynn Spry, PA-C  ANESTHESIA:   regional and general   PREOPERATIVE INDICATIONS:  Jorge Jordan is a  62 y.o. male with a diagnosis of M75.122 Complete rotatr-cuff tear/ruptr of left shoulder, not trauma who failed conservative measures and elected for surgical management.    The risks benefits and alternatives were discussed with the patient preoperatively including but not limited to the risks of infection, bleeding, nerve injury, persistent pain or weakness, failure of the hardware, re-tear of the rotator cuff and the need for further surgery. Medical risks include DVT and pulmonary embolism, myocardial infarction, stroke, pneumonia, respiratory failure and death. Patient understood these risks and wished to proceed.  OPERATIVE IMPLANTS: Conmed Suture Bridge with 2 medial Y-Knot anchors and 2 lateral crossFT anchors  OPERATIVE PROCEDURE: The patient was met in the preoperative area. The left shoulder was signed with my initials according the hospital's correct site of surgery protocol. The patient is brought to the OR and underwent a supraclavicular block and general endotracheal intubation by the anesthesia service.  The patient was placed in a beachchair position.  A spider arm positioner was used for this case. Examination under anesthesia revealed full passive ROM and a negative sulcus sign. There was anterior/posterior instability.  The patient was prepped and draped in a sterile fashion. A timeout was performed to verify the patient's name, date of  birth, medical record number, correct site of surgery and correct procedure to be performed there was also used to verify the patient received antibiotics that all appropriate instruments, implants and radiographs studies were available in the room. Once all in attendance were in agreement case began.  Bony landmarks were drawn out with a surgical marker along with proposed arthroscopy incisions. These were pre-injected with 0.25% marcaine with epi. An 11 blade was used to establish a posterior portal through which the arthroscope was placed in the glenohumeral joint. A full diagnostic examination of the shoulder was performed.  The anterior portal was established under direct visualization with an 18-gauge spinal needle.  A 5.75 mm arthroscopic cannula was placed through the anterior portal.   The biceps was absent consistent with prior tenodesis. Extensive scar was noted within the rotator interval and this was released with cautery. The arthroscopic shaver was then used to debride the frayed edges of the labrum. There were no anterior or superior labral tears seen.  Subscapularis tendon had partial thickness tearing superiorly, this was debrided to a stable border. Patient had a full-thickness tear involving the supraspinatus with retraction. There were no loose bodies within the inferior recess and no evidence of HAGL lesion. Mild degenerative changes were identified throughout with small area of grade 3 anterior humeral head. This was lightly debrided with the shaver to a stable border.  The arthroscope was then placed in the subacromial space. A lateral portal was then established using an 18-gauge spinal needle for localization.   The greater tuberosity was debrided using a 5.5 mm resector shaver blade to remove all remaining foreign fibers of the rotator cuff.  Debridement was performed until punctate bleeding was seen at the  greater tuberosity footprint, which will allow for rotator cuff healing.   Extensive bursitis was encountered and debrided using a 4-0 resector shaver blade and a 90 ArthroCare wand from the lateral portal. Using the a double row suture bridge system medial anchors with fiber tape were placed. The cuff was mobilized and the tape passed through the rotator cuff. The tape was then crossed in usual fashion and fixated on the lateral side with two CrossFT anchors. The final construct was stable and moved as a unit with excellent coverage of the humeral head.  A subacromial decompression was performed using a 4.0 mm burr from the lateral portal. The 4.0 mm burr was then placed through the anterior portal.  A distal clavicle excision was performed, with 8 mm of distal clavicle resected. Subacromial space was then copiously irrigated to remove all osseous debris. Final arthroscopic images were taken. Arthroscopic images were then removed.  All incisions were copiously irrigated. Skin closure for the arthroscopic incisions was performed with 3-0 nylon.  A dry sterile dressing including Steri-Strips was applied .  The patient was placed in an abduction sling.  All sharp and instrument counts were correct at the conclusion of the case. I was scrubbed and present for the entire case. I spoke with the patient's family in the post-op consultation room and informed them that the case had been performed without complication and the patient was stable in recovery room.   Kurtis Bushman, MD

## 2022-04-23 NOTE — H&P (Signed)
The patient has been re-examined, and the chart reviewed, and there have been no interval changes to the documented history and physical.  Plan a left shoulder scope today.  Anesthesia is consulted regarding a peripheral nerve block for post-operative pain.  The risks, benefits, and alternatives have been discussed at length, and the patient is willing to proceed.

## 2022-04-23 NOTE — Transfer of Care (Signed)
Immediate Anesthesia Transfer of Care Note  Patient: Jorge Jordan  Procedure(s) Performed: ARTHROSCOPY SHOULDER (Left: Shoulder) ARTHROSCOPIC ROTATOR CUFF REPAIR (Left: Shoulder)  Patient Location: PACU  Anesthesia Type:General  Level of Consciousness: awake  Airway & Oxygen Therapy: Patient Spontanous Breathing and Patient connected to face mask oxygen  Post-op Assessment: Report given to RN and Post -op Vital signs reviewed and stable  Post vital signs: Reviewed and stable  Last Vitals:  Vitals Value Taken Time  BP 110/78 04/23/22 1231  Temp 35.8 1231  Pulse 82 04/23/22 1236  Resp 19 04/23/22 1236  SpO2 96 % 04/23/22 1234  Vitals shown include unvalidated device data.  Last Pain:  Vitals:   04/23/22 0758  TempSrc: Temporal  PainSc: 0-No pain         Complications: No notable events documented.

## 2022-04-30 DIAGNOSIS — M25512 Pain in left shoulder: Secondary | ICD-10-CM | POA: Diagnosis not present

## 2022-04-30 DIAGNOSIS — Z9889 Other specified postprocedural states: Secondary | ICD-10-CM | POA: Diagnosis not present

## 2022-05-06 DIAGNOSIS — E119 Type 2 diabetes mellitus without complications: Secondary | ICD-10-CM | POA: Diagnosis not present

## 2022-05-06 DIAGNOSIS — E669 Obesity, unspecified: Secondary | ICD-10-CM | POA: Diagnosis not present

## 2022-05-06 DIAGNOSIS — F17200 Nicotine dependence, unspecified, uncomplicated: Secondary | ICD-10-CM | POA: Diagnosis not present

## 2022-05-06 DIAGNOSIS — I10 Essential (primary) hypertension: Secondary | ICD-10-CM | POA: Diagnosis not present

## 2022-05-06 DIAGNOSIS — G5601 Carpal tunnel syndrome, right upper limb: Secondary | ICD-10-CM | POA: Diagnosis not present

## 2022-05-06 DIAGNOSIS — G4730 Sleep apnea, unspecified: Secondary | ICD-10-CM | POA: Diagnosis not present

## 2022-05-06 DIAGNOSIS — Z6833 Body mass index (BMI) 33.0-33.9, adult: Secondary | ICD-10-CM | POA: Diagnosis not present

## 2022-05-22 DIAGNOSIS — Z9889 Other specified postprocedural states: Secondary | ICD-10-CM | POA: Diagnosis not present

## 2022-05-22 DIAGNOSIS — M25512 Pain in left shoulder: Secondary | ICD-10-CM | POA: Diagnosis not present

## 2022-05-24 DIAGNOSIS — M25512 Pain in left shoulder: Secondary | ICD-10-CM | POA: Diagnosis not present

## 2022-05-24 DIAGNOSIS — Z9889 Other specified postprocedural states: Secondary | ICD-10-CM | POA: Diagnosis not present

## 2022-05-28 DIAGNOSIS — M25512 Pain in left shoulder: Secondary | ICD-10-CM | POA: Diagnosis not present

## 2022-05-28 DIAGNOSIS — Z9889 Other specified postprocedural states: Secondary | ICD-10-CM | POA: Diagnosis not present

## 2022-05-31 DIAGNOSIS — M25512 Pain in left shoulder: Secondary | ICD-10-CM | POA: Diagnosis not present

## 2022-05-31 DIAGNOSIS — Z9889 Other specified postprocedural states: Secondary | ICD-10-CM | POA: Diagnosis not present

## 2022-06-04 DIAGNOSIS — Z9889 Other specified postprocedural states: Secondary | ICD-10-CM | POA: Diagnosis not present

## 2022-06-04 DIAGNOSIS — M25512 Pain in left shoulder: Secondary | ICD-10-CM | POA: Diagnosis not present

## 2022-06-06 ENCOUNTER — Encounter: Payer: Self-pay | Admitting: Family Medicine

## 2022-06-06 NOTE — Progress Notes (Signed)
Completed [employer] patient assistance form for Trintellix '10mg'$  daily, 90 day with 3 refills  Nobie Putnam, DO [facility] 06/06/2022, 1:57 PM

## 2022-06-11 ENCOUNTER — Ambulatory Visit (INDEPENDENT_AMBULATORY_CARE_PROVIDER_SITE_OTHER): Payer: Medicare HMO | Admitting: Family Medicine

## 2022-06-11 ENCOUNTER — Encounter: Payer: Self-pay | Admitting: Family Medicine

## 2022-06-11 VITALS — BP 126/80 | HR 103 | Ht 68.0 in | Wt 212.2 lb

## 2022-06-11 DIAGNOSIS — M25512 Pain in left shoulder: Secondary | ICD-10-CM | POA: Diagnosis not present

## 2022-06-11 DIAGNOSIS — Z9889 Other specified postprocedural states: Secondary | ICD-10-CM | POA: Diagnosis not present

## 2022-06-11 DIAGNOSIS — M109 Gout, unspecified: Secondary | ICD-10-CM | POA: Diagnosis not present

## 2022-06-11 MED ORDER — PREDNISONE 20 MG PO TABS: ORAL_TABLET | ORAL | 0 refills | Status: DC

## 2022-06-11 MED ORDER — COLCHICINE 0.6 MG PO TABS: ORAL_TABLET | ORAL | 2 refills | Status: AC

## 2022-06-11 NOTE — Progress Notes (Signed)
Subjective:    Patient ID: Jorge Jordan, male    DOB: 05-Oct-1960, 62 y.o.   MRN: [id number]  DANAN SURPRENANT is a 62 y.o. male presenting on 06/11/2022 for Toe Pain (Left big toe, pain in toe since Sunday.), Gout, and Shoulder Pain   HPI  Left Great Toe, gout flare Past history of prior gout flare 3 or 4 times total in past 30 years. No recent gout flare No recent Uric Acid testing First onset symptoms 3 days ago Sunday, acute flare L great toe red swollen tender painful Using loose shoes, difficulty to touch  Also Stiff and pain, L Shoulder, does not feel like rotator cuff pain he is post op 6 weeks, doing PT but this feels different  Denies injury trauma fever chills     06/11/2022    3:41 PM 02/07/2022    9:02 AM 09/06/2021    1:31 PM  Depression screen PHQ 2/9  Decreased Interest 1 2 0  Down, Depressed, Hopeless 1 1 0  PHQ - 2 Score 2 3 0  Altered sleeping 2 2   Tired, decreased energy 1 1   Change in appetite 2 1   Feeling bad or failure about yourself  0 1   Trouble concentrating 0 0   Moving slowly or fidgety/restless 0 0   Suicidal thoughts 1 0   PHQ-9 Score 8 8   Difficult doing work/chores Somewhat difficult Not difficult at all     Social History   Tobacco Use   Smoking status: Never   Smokeless tobacco: Never  Vaping Use   Vaping Use: Never used  Substance Use Topics   Alcohol use: Yes    Comment: occasionally   Drug use: No    Review of Systems Per HPI unless specifically indicated above     Objective:    BP 126/80 (BP Location: Right Arm, Patient Position: Sitting)   Pulse (!) 103   Ht '5\' 8"'$  (1.727 m)   Wt 212 lb 3.2 oz (96.3 kg)   SpO2 99%   BMI 32.26 kg/m   Wt Readings from Last 3 Encounters:  06/11/22 212 lb 3.2 oz (96.3 kg)  04/23/22 210 lb (95.3 kg)  04/15/22 212 lb 15.4 oz (96.6 kg)    Physical Exam Vitals and nursing note reviewed.  Constitutional:      General: He is not in acute distress.    Appearance: Normal appearance.  He is well-developed. He is not diaphoretic.     Comments: Well-appearing, comfortable, cooperative  HENT:     Head: Normocephalic and atraumatic.  Eyes:     General:        Right eye: No discharge.        Left eye: No discharge.     Conjunctiva/sclera: Conjunctivae normal.  Cardiovascular:     Rate and Rhythm: Normal rate.  Pulmonary:     Effort: Pulmonary effort is normal.  Musculoskeletal:     Comments: Left great toe MTP red swollen warm tender  L Shoulder in sling, post op rotator cuff. Did not do range of motion or other exam  Skin:    General: Skin is warm and dry.     Findings: No erythema or rash.  Neurological:     Mental Status: He is alert and oriented to person, place, and time.  Psychiatric:        Mood and Affect: Mood normal.        Behavior: Behavior normal.  Thought Content: Thought content normal.     Comments: Well groomed, good eye contact, normal speech and thoughts    Results for orders placed or performed during the hospital encounter of 04/15/22  CBC  Result Value Ref Range   WBC 3.9 (L) 4.0 - 10.5 K/uL   RBC 4.66 4.22 - 5.81 MIL/uL   Hemoglobin 16.0 13.0 - 17.0 g/dL   HCT 43.3 39.0 - 52.0 %   MCV 92.9 80.0 - 100.0 fL   MCH 34.3 (H) 26.0 - 34.0 pg   MCHC 37.0 (H) 30.0 - 36.0 g/dL   RDW 11.9 11.5 - 15.5 %   Platelets 232 150 - 400 K/uL   nRBC 0.0 0.0 - 0.2 %  Basic metabolic panel  Result Value Ref Range   Sodium 134 (L) 135 - 145 mmol/L   Potassium 3.4 (L) 3.5 - 5.1 mmol/L   Chloride 99 98 - 111 mmol/L   CO2 24 22 - 32 mmol/L   Glucose, Bld 139 (H) 70 - 99 mg/dL   BUN 13 8 - 23 mg/dL   Creatinine, Ser 1.01 0.61 - 1.24 mg/dL   Calcium 9.2 8.9 - 10.3 mg/dL   GFR, Estimated >60 >60 mL/min   Anion gap 11 5 - 15      Assessment & Plan:   Problem List Items Addressed This Visit   None Visit Diagnoses     Acute gout involving toe of left foot, unspecified cause    -  Primary   Relevant Medications   predniSONE (DELTASONE) 20 MG  tablet   colchicine 0.6 MG tablet       Clinically consistent with acute gout flare of Left great toe MTP, onset 2-3 days ago No treatment tried Last flare several years ago, very rare flares No recent uric acid No prevention therapy  Also L should stiffness pain. He is 6 wk post op rotator cuff, but does not feel like post op / post PT pain  Plan: Start Prednisone taper 7 day, hold NSAIDs Printed back up plan rx Colchicine if new flare or catch early Avoid excessive ambulation, relative rest, ice if helps, can take Tylenol PRN 4. Avoid food triggers (red meat, alcohol)  Future follow up can check uric acid level    Meds ordered this encounter  Medications   predniSONE (DELTASONE) 20 MG tablet    Sig: Take daily with food. Start with '60mg'$  (3 pills) x 2 days, then reduce to '40mg'$  (2 pills) x 2 days, then '20mg'$  (1 pill) x 3 days    Dispense:  13 tablet    Refill:  0   colchicine 0.6 MG tablet    Sig: Take 2 tablets = 1.2 mg at the first sign of gout flare, followed by 0.6 mg after 1 hour. Then every day after take 1 tablet daily for up to 7-10 days until gout flare resolved    Dispense:  30 tablet    Refill:  2      Follow up plan: Return if symptoms worsen or fail to improve.    Nobie Putnam, [facility] 06/11/2022, 3:58 PM

## 2022-06-11 NOTE — Patient Instructions (Addendum)
Thank you for coming to the office today.  Your Left pain is most likely caused an Acute Gout Flare - Gout is a chronic problem that will have episodic flare ups with pain, redness, swelling of a joint, most common spots are big toe, foot and ankle, knee or sometimes hands or wrists. It is caused by small crystals made of Uric Acid that form in the joint causing pain and swelling.  Start Prednisone burst  In FUTURE if we order - can Start Colchicine (Mitigare) today - First dose take 2 tabs (1.'2mg'$  total), wait 1 hour then take 1 tab (0.'6mg'$ ). Then next day start with 1 tab daily until pain resolves, if after 3 days pain is not resolving then you can increase to 1 tab twice a day until resolved. Continue same dose you were on when pain resolved for about 2-3 days AFTER pain is completely gone.    Our goal is to prevent future gout flares. Try to avoid dietary triggers that are the most common causes of gout flares. - Avoid the following foods/drinks: - Red meat, organ meat (liver) - Alcohol (especially beer, also wine, liquor) - Processed foods / carbs (white bread, white rice, pasta, sugar) - Sugary drinks (sweet tea, soda) - Shellfish, shrimp / lobster  - Foods that are preferred to eat: - Beans, Lentils, Whole grains, Quinoa - Fruits, Vegetables - Dairy, Cheese, Yogurt - Soy based protein   Please schedule a Follow-up Appointment to: Return if symptoms worsen or fail to improve.  If you have any other questions or concerns, please feel free to call the office or send a message through [place]. You may also schedule an earlier appointment if necessary.  Additionally, you may be receiving a survey about your experience at our office within a few days to 1 week by e-mail or mail. We value your feedback.  Nobie Putnam, DO [facility]

## 2022-06-14 DIAGNOSIS — M25512 Pain in left shoulder: Secondary | ICD-10-CM | POA: Diagnosis not present

## 2022-06-18 DIAGNOSIS — M25512 Pain in left shoulder: Secondary | ICD-10-CM | POA: Diagnosis not present

## 2022-06-18 DIAGNOSIS — Z9889 Other specified postprocedural states: Secondary | ICD-10-CM | POA: Diagnosis not present

## 2022-06-21 DIAGNOSIS — M25512 Pain in left shoulder: Secondary | ICD-10-CM | POA: Diagnosis not present

## 2022-06-21 DIAGNOSIS — Z9889 Other specified postprocedural states: Secondary | ICD-10-CM | POA: Diagnosis not present

## 2022-06-25 DIAGNOSIS — M25512 Pain in left shoulder: Secondary | ICD-10-CM | POA: Diagnosis not present

## 2022-06-25 DIAGNOSIS — Z9889 Other specified postprocedural states: Secondary | ICD-10-CM | POA: Diagnosis not present

## 2022-06-28 DIAGNOSIS — M25512 Pain in left shoulder: Secondary | ICD-10-CM | POA: Diagnosis not present

## 2022-06-28 DIAGNOSIS — Z9889 Other specified postprocedural states: Secondary | ICD-10-CM | POA: Diagnosis not present

## 2022-07-01 DIAGNOSIS — M25512 Pain in left shoulder: Secondary | ICD-10-CM | POA: Diagnosis not present

## 2022-07-01 DIAGNOSIS — Z9889 Other specified postprocedural states: Secondary | ICD-10-CM | POA: Diagnosis not present

## 2022-07-03 DIAGNOSIS — Z9889 Other specified postprocedural states: Secondary | ICD-10-CM | POA: Diagnosis not present

## 2022-07-03 DIAGNOSIS — M25512 Pain in left shoulder: Secondary | ICD-10-CM | POA: Diagnosis not present

## 2022-07-11 DIAGNOSIS — M25512 Pain in left shoulder: Secondary | ICD-10-CM | POA: Diagnosis not present

## 2022-07-11 DIAGNOSIS — Z9889 Other specified postprocedural states: Secondary | ICD-10-CM | POA: Diagnosis not present

## 2022-07-16 DIAGNOSIS — Z9889 Other specified postprocedural states: Secondary | ICD-10-CM | POA: Diagnosis not present

## 2022-07-16 DIAGNOSIS — M25512 Pain in left shoulder: Secondary | ICD-10-CM | POA: Diagnosis not present

## 2022-07-18 DIAGNOSIS — Z9889 Other specified postprocedural states: Secondary | ICD-10-CM | POA: Diagnosis not present

## 2022-07-18 DIAGNOSIS — M25512 Pain in left shoulder: Secondary | ICD-10-CM | POA: Diagnosis not present

## 2022-08-06 DIAGNOSIS — L570 Actinic keratosis: Secondary | ICD-10-CM | POA: Diagnosis not present

## 2022-08-06 DIAGNOSIS — D485 Neoplasm of uncertain behavior of skin: Secondary | ICD-10-CM | POA: Diagnosis not present

## 2022-08-06 DIAGNOSIS — D2272 Melanocytic nevi of left lower limb, including hip: Secondary | ICD-10-CM | POA: Diagnosis not present

## 2022-08-06 DIAGNOSIS — L820 Inflamed seborrheic keratosis: Secondary | ICD-10-CM | POA: Diagnosis not present

## 2022-08-06 DIAGNOSIS — D2262 Melanocytic nevi of left upper limb, including shoulder: Secondary | ICD-10-CM | POA: Diagnosis not present

## 2022-08-06 DIAGNOSIS — D2261 Melanocytic nevi of right upper limb, including shoulder: Secondary | ICD-10-CM | POA: Diagnosis not present

## 2022-08-06 DIAGNOSIS — Z85828 Personal history of other malignant neoplasm of skin: Secondary | ICD-10-CM | POA: Diagnosis not present

## 2022-08-19 ENCOUNTER — Telehealth: Payer: Self-pay | Admitting: Family Medicine

## 2022-08-19 NOTE — Telephone Encounter (Signed)
Contacted Jorge Jordan to schedule their annual wellness visit. Appointment made for 09/12/2022.  Verlee Rossetti; Care Guide Ambulatory Clinical Support [facility] Direct Dial: [phone]

## 2022-09-12 ENCOUNTER — Ambulatory Visit (INDEPENDENT_AMBULATORY_CARE_PROVIDER_SITE_OTHER): Payer: Medicare HMO

## 2022-09-12 VITALS — Ht 68.0 in | Wt 212.0 lb

## 2022-09-12 DIAGNOSIS — Z0000 Encounter for general adult medical examination without abnormal findings: Secondary | ICD-10-CM

## 2022-09-12 DIAGNOSIS — Z1211 Encounter for screening for malignant neoplasm of colon: Secondary | ICD-10-CM

## 2022-09-12 NOTE — Progress Notes (Signed)
I connected with  Elyse Jarvis on 09/12/22 by a audio enabled telemedicine application and verified that I am speaking with the correct person using two identifiers.  Patient Location: Home  Provider Location: Office/Clinic  I discussed the limitations of evaluation and management by telemedicine. The patient expressed understanding and agreed to proceed.  Subjective:   Jorge Jordan is a 62 y.o. male who presents for Medicare Annual/Subsequent preventive examination.  Review of Systems     Cardiac Risk Factors include: advanced age (>57men, >10 women);hypertension;male gender;dyslipidemia;sedentary lifestyle     Objective:    Today's Vitals   09/12/22 1333 09/12/22 1349  Weight:  212 lb (96.2 kg)  Height:  5\' 8"  (1.727 m)  PainSc: 4     Body mass index is 32.23 kg/m.     09/12/2022    1:38 PM 04/23/2022    7:54 AM 04/15/2022    8:22 AM 05/01/2021   10:40 AM 04/18/2021    8:18 AM 11/22/2020    7:15 AM 09/05/2020    2:01 PM  Advanced Directives  Does Patient Have a Medical Advance Directive? No No No No No No Yes  Type of Tax inspector;Living will  Copy of Healthcare Power of Attorney in Chart?       No - copy requested  Would patient like information on creating a medical advance directive? No - Patient declined No - Patient declined         Current Medications (verified) Outpatient Encounter Medications as of 09/12/2022  Medication Sig   acetaminophen (TYLENOL) 500 MG tablet Take 1,000 mg by mouth every 6 (six) hours as needed for moderate pain.   amLODipine (NORVASC) 5 MG tablet Take 1 tablet (5 mg total) by mouth daily.   aspirin EC 81 MG tablet Take 81 mg by mouth daily.   busPIRone (BUSPAR) 10 MG tablet Take 1 tablet (10 mg total) by mouth 2 (two) times daily.   colchicine 0.6 MG tablet Take 2 tablets = 1.2 mg at the first sign of gout flare, followed by 0.6 mg after 1 hour. Then every day after take 1 tablet daily for up to  7-10 days until gout flare resolved   diphenhydrAMINE (BENADRYL) 25 MG tablet Take 25 mg by mouth every 6 (six) hours as needed.   fluoruracil (CARAC) 0.5 % cream Apply twice daily to forehead and temples for 4-5 days and scalp for 5-6 days   lisinopril-hydrochlorothiazide (ZESTORETIC) 20-25 MG tablet Take 1 tablet by mouth daily.   Melatonin 10 MG TABS Take by mouth daily. qhs   pravastatin (PRAVACHOL) 40 MG tablet Take 1 tablet (40 mg total) by mouth daily.   sildenafil (REVATIO) 20 MG tablet Take 3-5 tablets as needed 30 prior to sex.   traZODone (DESYREL) 100 MG tablet Take 1 tablet (100 mg total) by mouth at bedtime. (Patient taking differently: Take 100 mg by mouth at bedtime as needed for sleep.)   vortioxetine HBr (TRINTELLIX) 10 MG TABS tablet Take 1 tablet (10 mg total) by mouth daily.   predniSONE (DELTASONE) 20 MG tablet Take daily with food. Start with 60mg  (3 pills) x 2 days, then reduce to 40mg  (2 pills) x 2 days, then 20mg  (1 pill) x 3 days (Patient not taking: Reported on 09/12/2022)   No facility-administered encounter medications on file as of 09/12/2022.    Allergies (verified) Patient has no known allergies.   History: Past Medical History:  Diagnosis Date   Allergy    Anxiety    Arthritis    Asthma    Cancer (HCC)    squamous cell   Depression    Foot deformity    foot arthrodesis L foot   Hyperlipidemia    Hypertension    Osteoporosis    PONV (postoperative nausea and vomiting)    Pre-diabetes    Shoulder bursitis    both    Tendinitis    elbow   Past Surgical History:  Procedure Laterality Date   ARTHOSCOPIC ROTAOR CUFF REPAIR Left 04/23/2022   Procedure: ARTHROSCOPIC ROTATOR CUFF REPAIR;  Surgeon: Lyndle Herrlich, MD;  Location: [facility];  Service: Orthopedics;  Laterality: Left;   COLONOSCOPY N/A 11/22/2020   Procedure: COLONOSCOPY;  Surgeon: Pasty Spillers, MD;  Location: [facility];  Service: Endoscopy;  Laterality: N/A;   FOOT SURGERY  Left 2001   Triple arthrodesis   KNEE ARTHROSCOPY WITH LATERAL MENISECTOMY Right 05/01/2021   Procedure: KNEE ARTHROSCOPY WITH LATERAL MENISECTOMY;  Surgeon: Kennedy Bucker, MD;  Location: [facility];  Service: Orthopedics;  Laterality: Right;   KNEE ARTHROSCOPY WITH MEDIAL MENISECTOMY Right 05/01/2021   Procedure: KNEE ARTHROSCOPY WITH MEDIAL MENISECTOMY;  Surgeon: Kennedy Bucker, MD;  Location: [facility];  Service: Orthopedics;  Laterality: Right;   QUADRICEPS TENDON REPAIR Right 07/14/2020   Procedure: REPAIR QUADRICEP TENDON;  Surgeon: Kennedy Bucker, MD;  Location: [facility];  Service: Orthopedics;  Laterality: Right;   QUADRICEPS TENDON REPAIR Right 05/01/2021   Procedure: REPAIR QUADRICEP TENDON;  Surgeon: Kennedy Bucker, MD;  Location: [facility];  Service: Orthopedics;  Laterality: Right;   REPAIR QUADRICEPS/HAMSTRING MUSCLES Right 05/04/2020   Procedure: REPAIR QUADRICEPS/HAMSTRING MUSCLES;  Surgeon: Kennedy Bucker, MD;  Location: [facility];  Service: Orthopedics;  Laterality: Right;  RIGHT QUADRICEPS   ROTATOR CUFF REPAIR Left 2009   SHOULDER ARTHROSCOPY Left 04/23/2022   Procedure: ARTHROSCOPY SHOULDER;  Surgeon: Lyndle Herrlich, MD;  Location: [facility];  Service: Orthopedics;  Laterality: Left;   SHOULDER FUSION SURGERY Left 2009   WRIST SURGERY Right    ganglion cyst   Family History  Problem Relation Age of Onset   Hyperlipidemia Mother    Hypertension Mother    Diabetes Mother    Depression Mother    Hypertension Father    Hyperlipidemia Father    Cancer Father        skin cancer   Hypertension Brother    Diabetes Maternal Grandmother    Heart attack Maternal Grandfather    Social History   Socioeconomic History   Marital status: Married    Spouse name: Cordelia Pen   Number of children: 4   Years of education: Not on file   Highest education level: Not on file  Occupational History   Occupation: disability   Tobacco Use   Smoking status: Never   Smokeless tobacco: Never   Vaping Use   Vaping Use: Never used  Substance and Sexual Activity   Alcohol use: Yes    Comment: occasionally   Drug use: No   Sexual activity: Not on file  Other Topics Concern   Not on file  Social History Narrative   Not on file   Social Determinants of Health   Financial Resource Strain: Low Risk  (09/12/2022)   Overall Financial Resource Strain (CARDIA)    Difficulty of Paying Living Expenses: Not hard at all  Food Insecurity: No Food Insecurity (09/12/2022)   Hunger Vital Sign    Worried About Running  Out of Food in the Last Year: Never true    Ran Out of Food in the Last Year: Never true  Transportation Needs: No Transportation Needs (09/12/2022)   PRAPARE - Administrator, Civil Service (Medical): No    Lack of Transportation (Non-Medical): No  Physical Activity: Insufficiently Active (09/12/2022)   Exercise Vital Sign    Days of Exercise per Week: 2 days    Minutes of Exercise per Session: 20 min  Stress: No Stress Concern Present (09/12/2022)   [employer] of Occupational Health - Occupational Stress Questionnaire    Feeling of Stress : Not at all  Social Connections: Moderately Isolated (09/12/2022)   Social Connection and Isolation Panel [NHANES]    Frequency of Communication with Friends and Family: More than three times a week    Frequency of Social Gatherings with Friends and Family: Not on file    Attends Religious Services: Never    Database administrator or Organizations: No    Attends Engineer, structural: Never    Marital Status: Married    Tobacco Counseling Counseling given: Not Answered   Clinical Intake:  Pre-visit preparation completed: Yes  Pain : 0-10 Pain Score: 4  Pain Type: Chronic pain Pain Location: Shoulder Pain Orientation: Left     Nutritional Risks: None Diabetes: No  How often do you need to have someone help you when you read instructions, pamphlets, or other written materials from your doctor or  pharmacy?: 1 - Never  Diabetic?no  Interpreter Needed?: No  Information entered by :: Kennedy Bucker, LPN   Activities of Daily Living    09/12/2022    1:39 PM 09/09/2022   12:51 PM  In your present state of health, do you have any difficulty performing the following activities:  Hearing? 0   Vision? 0 0  Difficulty concentrating or making decisions? 0 0  Walking or climbing stairs? 0 0  Comment knees hurt   Dressing or bathing? 0 0  Doing errands, shopping? 0 0  Preparing Food and eating ? N N  Using the Toilet? N   In the past six months, have you accidently leaked urine? N N  Do you have problems with loss of bowel control? N N  Managing your Medications? N N  Managing your Finances? N N  Housekeeping or managing your Housekeeping? N N    Patient Care Team: Smitty Cords, DO as PCP - General (Family Medicine) Lorraine Lax, FNP (Rheumatology) Gustavus Bryant, LCSW as [facility] Care Management (Licensed Clinical Social Worker)  Indicate any recent Medical Services you may have received from other than [facility] providers in the past year (date may be approximate).     Assessment:   This is a routine wellness examination for [place].  Hearing/Vision screen Hearing Screening - Comments:: No aids Vision Screening - Comments:: Wears glasses- [facility]   Dietary issues and exercise activities discussed: Current Exercise Habits: Home exercise routine, Type of exercise: walking, Time (Minutes): 20, Frequency (Times/Week): 2, Weekly Exercise (Minutes/Week): 40, Intensity: Mild   Goals Addressed             This Visit's Progress    DIET - EAT MORE FRUITS AND VEGETABLES         Depression Screen    09/12/2022    1:36 PM 06/11/2022    3:41 PM 02/07/2022    9:02 AM 09/06/2021    1:31 PM 11/08/2020   10:38 AM 09/05/2020  2:04 PM 10/25/2019    2:13 PM  PHQ 2/9 Scores  PHQ - 2 Score 3 2 3  0 1 0 2  PHQ- 9 Score 7 8 8  9  4     Fall Risk     09/12/2022    1:39 PM 09/09/2022   12:51 PM 06/11/2022    3:41 PM 02/07/2022    9:02 AM 09/06/2021    1:30 PM  Fall Risk   Falls in the past year? 1 0 0 0 0  Number falls in past yr: 0 0 0 0 0  Injury with Fall? 1 0 0 0 0  Risk for fall due to : History of fall(s)   No Fall Risks No Fall Risks  Follow up Falls prevention discussed;Falls evaluation completed   Falls evaluation completed Falls evaluation completed    FALL RISK PREVENTION PERTAINING TO THE HOME:  Any stairs in or around the home? Yes  If so, are there any without handrails? No  Home free of loose throw rugs in walkways, pet beds, electrical cords, etc? Yes  Adequate lighting in your home to reduce risk of falls? Yes   ASSISTIVE DEVICES UTILIZED TO PREVENT FALLS:  Life alert? No  Use of a cane, walker or w/c? No  Grab bars in the bathroom? No  Shower chair or bench in shower? Yes  Elevated toilet seat or a handicapped toilet? No    Cognitive Function:        09/12/2022    1:45 PM 09/06/2021    1:32 PM 09/05/2020    2:05 PM  6CIT Screen  What Year? 0 points 0 points 0 points  What month? 0 points 0 points 0 points  What time? 0 points 0 points 0 points  Count back from 20 0 points 0 points 0 points  Months in reverse 0 points 0 points 0 points  Repeat phrase 0 points 0 points 0 points  Total Score 0 points 0 points 0 points    Immunizations Immunization History  Administered Date(s) Administered   Influenza, Quadrivalent, Recombinant, Inj, Pf 12/16/2021   Influenza,inj,Quad PF,6+ Mos 02/05/2017, 01/26/2018   Influenza-Unspecified 02/05/2017, 01/26/2018, 02/08/2019   Moderna SARS-COV2 Booster Vaccination 01/15/2022   PFIZER Comirnaty(Gray Top)Covid-19 Tri-Sucrose Vaccine 07/01/2019, 07/22/2019   PFIZER(Purple Top)SARS-COV-2 Vaccination 07/01/2019, 07/22/2019, 08/25/2020   Respiratory Syncytial Virus Vaccine,Recomb Aduvanted(Arexvy) 01/15/2022   Tdap 10/19/2012   Zoster Recombinat (Shingrix) 01/03/2020,  05/19/2020    TDAP status: Up to date  Flu Vaccine status: Up to date  Pneumococcal vaccine status: Up to date  Covid-19 vaccine status: Completed vaccines  Qualifies for Shingles Vaccine? Yes   Zostavax completed No   Shingrix Completed?: Yes  Screening Tests Health Maintenance  Topic Date Due   HIV Screening  Never done   COVID-19 Vaccine (7 - 2023-24 season) 03/12/2022   Colonoscopy  11/23/2022   DTaP/Tdap/Td (2 - Td or Tdap) 10/20/2022   INFLUENZA VACCINE  11/07/2022   Medicare Annual Wellness (AWV)  09/12/2023   Hepatitis C Screening  Completed   Zoster Vaccines- Shingrix  Completed   HPV VACCINES  Aged Out   Fecal DNA (Cologuard)  Discontinued    Health Maintenance  Health Maintenance Due  Topic Date Due   HIV Screening  Never done   COVID-19 Vaccine (7 - 2023-24 season) 03/12/2022   Colonoscopy  11/23/2022    Colorectal cancer screening: Type of screening: Colonoscopy. Completed 11/22/20. Repeat every 2 years  Lung Cancer Screening: (Low Dose CT Chest recommended  if Age 19-80 years, 30 pack-year currently smoking OR have quit w/in 15years.) does not qualify.    Additional Screening:  Hepatitis C Screening: does qualify; Completed 06/07/15  Vision Screening: Recommended annual ophthalmology exams for early detection of glaucoma and other disorders of the eye. Is the patient up to date with their annual eye exam?  Yes  Who is the provider or what is the name of the office in which the patient attends annual eye exams? [facility] If pt is not established with a provider, would they like to be referred to a provider to establish care? No .   Dental Screening: Recommended annual dental exams for proper oral hygiene  Community Resource Referral / Chronic Care Management: CRR required this visit?  No   CCM required this visit?  No      Plan:     I have personally reviewed and noted the following in the patient's chart:   Medical and social history Use of  alcohol, tobacco or illicit drugs  Current medications and supplements including opioid prescriptions. Patient is not currently taking opioid prescriptions. Functional ability and status Nutritional status Physical activity Advanced directives List of other physicians Hospitalizations, surgeries, and ER visits in previous 12 months Vitals Screenings to include cognitive, depression, and falls Referrals and appointments  In addition, I have reviewed and discussed with patient certain preventive protocols, quality metrics, and best practice recommendations. A written personalized care plan for preventive services as well as general preventive health recommendations were provided to patient.     Hal Hope, LPN   04/13/1094   Nurse Notes: none

## 2022-09-12 NOTE — Patient Instructions (Signed)
Jorge Jordan , Thank you for taking time to come for your Medicare Wellness Visit. I appreciate your ongoing commitment to your health goals. Please review the following plan we discussed and let me know if I can assist you in the future.   These are the goals we discussed:  Goals      DIET - EAT MORE FRUITS AND VEGETABLES     Patient Stated     09/05/2020, wants to lose 10 pounds        This is a list of the screening recommended for you and due dates:  Health Maintenance  Topic Date Due   HIV Screening  Never done   COVID-19 Vaccine (7 - 2023-24 season) 03/12/2022   Colon Cancer Screening  11/23/2022   DTaP/Tdap/Td vaccine (2 - Td or Tdap) 10/20/2022   Flu Shot  11/07/2022   Medicare Annual Wellness Visit  09/12/2023   Hepatitis C Screening  Completed   Zoster (Shingles) Vaccine  Completed   HPV Vaccine  Aged Out   Cologuard (Stool DNA test)  Discontinued    Advanced directives: no  Conditions/risks identified: none  Next appointment: Follow up in one year for your annual wellness visit 09/18/23 @ 1:30 pm by phone  Preventive Care 40-64 Years, Male Preventive care refers to lifestyle choices and visits with your health care provider that can promote health and wellness. What does preventive care include? A yearly physical exam. This is also called an annual well check. Dental exams once or twice a year. Routine eye exams. Ask your health care provider how often you should have your eyes checked. Personal lifestyle choices, including: Daily care of your teeth and gums. Regular physical activity. Eating a healthy diet. Avoiding tobacco and drug use. Limiting alcohol use. Practicing safe sex. Taking low-dose aspirin every day starting at age 84. What happens during an annual well check? The services and screenings done by your health care provider during your annual well check will depend on your age, overall health, lifestyle risk factors, and family history of  disease. Counseling  Your health care provider may ask you questions about your: Alcohol use. Tobacco use. Drug use. Emotional well-being. Home and relationship well-being. Sexual activity. Eating habits. Work and work Astronomer. Screening  You may have the following tests or measurements: Height, weight, and BMI. Blood pressure. Lipid and cholesterol levels. These may be checked every 5 years, or more frequently if you are over 8 years old. Skin check. Lung cancer screening. You may have this screening every year starting at age 46 if you have a 30-pack-year history of smoking and currently smoke or have quit within the past 15 years. Fecal occult blood test (FOBT) of the stool. You may have this test every year starting at age 16. Flexible sigmoidoscopy or colonoscopy. You may have a sigmoidoscopy every 5 years or a colonoscopy every 10 years starting at age 60. Prostate cancer screening. Recommendations will vary depending on your family history and other risks. Hepatitis C blood test. Hepatitis B blood test. Sexually transmitted disease (STD) testing. Diabetes screening. This is done by checking your blood sugar (glucose) after you have not eaten for a while (fasting). You may have this done every 1-3 years. Discuss your test results, treatment options, and if necessary, the need for more tests with your health care provider. Vaccines  Your health care provider may recommend certain vaccines, such as: Influenza vaccine. This is recommended every year. Tetanus, diphtheria, and acellular pertussis (Tdap, Td) vaccine.  You may need a Td booster every 10 years. Zoster vaccine. You may need this after age 87. Pneumococcal 13-valent conjugate (PCV13) vaccine. You may need this if you have certain conditions and have not been vaccinated. Pneumococcal polysaccharide (PPSV23) vaccine. You may need one or two doses if you smoke cigarettes or if you have certain conditions. Talk to your  health care provider about which screenings and vaccines you need and how often you need them. This information is not intended to replace advice given to you by your health care provider. Make sure you discuss any questions you have with your health care provider. Document Released: 04/21/2015 Document Revised: 12/13/2015 Document Reviewed: 01/24/2015 Elsevier Interactive Patient Education  2017 [employer].  Fall Prevention in the Home Falls can cause injuries. They can happen to people of all ages. There are many things you can do to make your home safe and to help prevent falls. What can I do on the outside of my home? Regularly fix the edges of walkways and driveways and fix any cracks. Remove anything that might make you trip as you walk through a door, such as a raised step or threshold. Trim any bushes or trees on the path to your home. Use bright outdoor lighting. Clear any walking paths of anything that might make someone trip, such as rocks or tools. Regularly check to see if handrails are loose or broken. Make sure that both sides of any steps have handrails. Any raised decks and porches should have guardrails on the edges. Have any leaves, snow, or ice cleared regularly. Use sand or salt on walking paths during winter. Clean up any spills in your garage right away. This includes oil or grease spills. What can I do in the bathroom? Use night lights. Install grab bars by the toilet and in the tub and shower. Do not use towel bars as grab bars. Use non-skid mats or decals in the tub or shower. If you need to sit down in the shower, use a plastic, non-slip stool. Keep the floor dry. Clean up any water that spills on the floor as soon as it happens. Remove soap buildup in the tub or shower regularly. Attach bath mats securely with double-sided non-slip rug tape. Do not have throw rugs and other things on the floor that can make you trip. What can I do in the bedroom? Use night  lights. Make sure that you have a light by your bed that is easy to reach. Do not use any sheets or blankets that are too big for your bed. They should not hang down onto the floor. Have a firm chair that has side arms. You can use this for support while you get dressed. Do not have throw rugs and other things on the floor that can make you trip. What can I do in the kitchen? Clean up any spills right away. Avoid walking on wet floors. Keep items that you use a lot in easy-to-reach places. If you need to reach something above you, use a strong step stool that has a grab bar. Keep electrical cords out of the way. Do not use floor polish or wax that makes floors slippery. If you must use wax, use non-skid floor wax. Do not have throw rugs and other things on the floor that can make you trip. What can I do with my stairs? Do not leave any items on the stairs. Make sure that there are handrails on both sides of the stairs and  use them. Fix handrails that are broken or loose. Make sure that handrails are as long as the stairways. Check any carpeting to make sure that it is firmly attached to the stairs. Fix any carpet that is loose or worn. Avoid having throw rugs at the top or bottom of the stairs. If you do have throw rugs, attach them to the floor with carpet tape. Make sure that you have a light switch at the top of the stairs and the bottom of the stairs. If you do not have them, ask someone to add them for you. What else can I do to help prevent falls? Wear shoes that: Do not have high heels. Have rubber bottoms. Are comfortable and fit you well. Are closed at the toe. Do not wear sandals. If you use a stepladder: Make sure that it is fully opened. Do not climb a closed stepladder. Make sure that both sides of the stepladder are locked into place. Ask someone to hold it for you, if possible. Clearly mark and make sure that you can see: Any grab bars or handrails. First and last  steps. Where the edge of each step is. Use tools that help you move around (mobility aids) if they are needed. These include: Canes. Walkers. Scooters. Crutches. Turn on the lights when you go into a dark area. Replace any light bulbs as soon as they burn out. Set up your furniture so you have a clear path. Avoid moving your furniture around. If any of your floors are uneven, fix them. If there are any pets around you, be aware of where they are. Review your medicines with your doctor. Some medicines can make you feel dizzy. This can increase your chance of falling. Ask your doctor what other things that you can do to help prevent falls. This information is not intended to replace advice given to you by your health care provider. Make sure you discuss any questions you have with your health care provider. Document Released: 01/19/2009 Document Revised: 08/31/2015 Document Reviewed: 04/29/2014 Elsevier Interactive Patient Education  2017 [employer].

## 2022-09-23 ENCOUNTER — Encounter: Payer: Self-pay | Admitting: *Deleted

## 2023-02-14 ENCOUNTER — Ambulatory Visit (INDEPENDENT_AMBULATORY_CARE_PROVIDER_SITE_OTHER): Payer: Medicare HMO | Admitting: Family Medicine

## 2023-02-14 ENCOUNTER — Encounter: Payer: Self-pay | Admitting: Family Medicine

## 2023-02-14 VITALS — BP 138/70 | HR 118 | Ht 67.0 in | Wt 207.0 lb

## 2023-02-14 DIAGNOSIS — Z0000 Encounter for general adult medical examination without abnormal findings: Secondary | ICD-10-CM | POA: Diagnosis not present

## 2023-02-14 DIAGNOSIS — Z125 Encounter for screening for malignant neoplasm of prostate: Secondary | ICD-10-CM | POA: Diagnosis not present

## 2023-02-14 DIAGNOSIS — E7800 Pure hypercholesterolemia, unspecified: Secondary | ICD-10-CM

## 2023-02-14 DIAGNOSIS — F419 Anxiety disorder, unspecified: Secondary | ICD-10-CM

## 2023-02-14 DIAGNOSIS — E782 Mixed hyperlipidemia: Secondary | ICD-10-CM

## 2023-02-14 DIAGNOSIS — Z1211 Encounter for screening for malignant neoplasm of colon: Secondary | ICD-10-CM | POA: Diagnosis not present

## 2023-02-14 DIAGNOSIS — F339 Major depressive disorder, recurrent, unspecified: Secondary | ICD-10-CM

## 2023-02-14 DIAGNOSIS — R7303 Prediabetes: Secondary | ICD-10-CM

## 2023-02-14 DIAGNOSIS — M1A9XX0 Chronic gout, unspecified, without tophus (tophi): Secondary | ICD-10-CM | POA: Diagnosis not present

## 2023-02-14 DIAGNOSIS — F4310 Post-traumatic stress disorder, unspecified: Secondary | ICD-10-CM | POA: Diagnosis not present

## 2023-02-14 DIAGNOSIS — I10 Essential (primary) hypertension: Secondary | ICD-10-CM | POA: Diagnosis not present

## 2023-02-14 MED ORDER — BUSPIRONE HCL 10 MG PO TABS: 10.0000 mg | ORAL_TABLET | Freq: Two times a day (BID) | ORAL | 3 refills | Status: DC

## 2023-02-14 MED ORDER — CLONAZEPAM 0.5 MG PO TABS: 0.5000 mg | ORAL_TABLET | Freq: Two times a day (BID) | ORAL | 2 refills | Status: DC | PRN

## 2023-02-14 MED ORDER — PRAVASTATIN SODIUM 40 MG PO TABS: 40.0000 mg | ORAL_TABLET | Freq: Every day | ORAL | 3 refills | Status: DC

## 2023-02-14 MED ORDER — AMLODIPINE BESYLATE 5 MG PO TABS: 5.0000 mg | ORAL_TABLET | Freq: Every day | ORAL | 3 refills | Status: DC

## 2023-02-14 MED ORDER — LISINOPRIL-HYDROCHLOROTHIAZIDE 20-25 MG PO TABS: 1.0000 | ORAL_TABLET | Freq: Every day | ORAL | 3 refills | Status: DC

## 2023-02-14 NOTE — Progress Notes (Signed)
Subjective:    Patient ID: Jorge Jordan, male    DOB: 03-Aug-1960, 62 y.o.   MRN: [id number]  Jorge Jordan is a 62 y.o. male presenting on 02/14/2023 for Annual Exam   HPI  Here for Annual Physical and [facility] orders  Discussed the use of AI scribe software for clinical note transcription with the patient, who gave verbal consent to proceed.    Elevated A1c - Pre-Diabetes / Obesity BMI >32 Meds: None (never on meds) Currently on ACEi Diet - no significant changes has tried to improve diet, but not always - Exercise (improving) Tried >6 months of diet lifestyle exercise regimen without success - Family history of DM Interested in GLP therapy for wt loss Denies hypoglycemia   CHRONIC HTN: BP recently elevated with stress Current Meds - Lisinopril-HCTZ 20-25mg  daily, Amlodipine 5mg  daily Reports good compliance, took meds today. Tolerating well, w/o complaints. Denies CP, dyspnea, HA, edema, dizziness / lightheadedness   HYPERLIPIDEMIA: History fatty liver - Currently taking Pravastatin 40mg , tolerating well without side effects or myalgias   Hepatic Steatosis Elevated LFTs previously   Major Depression, chronic recurrent / Anxiety + PTSD Recent worsening mental health Stressors with significant mood depression and anxiety Daughter estranged for 2 years Closest friend hunting buddy passed away His support system is mostly his wife No longer followed by Psychiatry. Previously with Psychology Therapist - He requests to return to them. Needs referral. See info below. - Continues on Trintellix 10mg  daily, Trazodone 100mg  nightly AS NEEDED for insomnia using about 1x weekly, Buspirone 10mg  TWICE A DAY regularly but less relief. He did better on Clonazepam in the past when he had more severe symptoms. Trintellix rx authorized on his manufacturer discount next due later this year.    Left Shoulder, Rotator Cuff Repair Prior s/p rotation 03/2008 Has had some gradual  wear and tear worsening pain He will return for injection and referral  followed by Dr Odis Luster - [facility] for Left Shoulder Previously we did cortisone injection here but insufficient relief S/p arthroscopy 04/2022 with rotator cuff repair, and repaired torn biceps muscle tendon (removed) Advanced Bursitis  Followed by Dr Rosita Kea for Right Knee   Health Maintenance:   Last Colonoscopy 11/22/20 Dr Maximino Greenland [facility] [place], inadequate prep. Needs repeat. Due in 2 years. Now needs order to return for Colonoscopy. Needs different bowel prep.      02/14/2023   11:00 AM 09/12/2022    1:36 PM 06/11/2022    3:41 PM  Depression screen PHQ 2/9  Decreased Interest 1 1 1   Down, Depressed, Hopeless 2 2 1   PHQ - 2 Score 3 3 2   Altered sleeping 2 2 2   Tired, decreased energy 2 2 1   Change in appetite 1 0 2  Feeling bad or failure about yourself  2 0 0  Trouble concentrating 0 0 0  Moving slowly or fidgety/restless 0 0 0  Suicidal thoughts 0 0 1  PHQ-9 Score 10 7 8   Difficult doing work/chores Somewhat difficult Somewhat difficult Somewhat difficult      02/14/2023   11:00 AM 06/11/2022    3:40 PM 02/07/2022    9:02 AM 11/08/2020   10:39 AM  GAD 7 : Generalized Anxiety Score  Nervous, Anxious, on Edge 1 1 1 1   Control/stop worrying 2 1 2 1   Worry too much - different things 2 2 2 2   Trouble relaxing 2 1 2 2   Restless 0 2 1 1   Easily annoyed or irritable  1 0 1 1  Afraid - awful might happen 1 0 2 1  Total GAD 7 Score 9 7 11 9   Anxiety Difficulty  Somewhat difficult Somewhat difficult Somewhat difficult      Past Medical History:  Diagnosis Date   Allergy    Anxiety    Arthritis    Asthma    Cancer (HCC)    squamous cell   Depression    Foot deformity    foot arthrodesis L foot   Hyperlipidemia    Hypertension    Osteoporosis    PONV (postoperative nausea and vomiting)    Pre-diabetes    Shoulder bursitis    both    Tendinitis    elbow   Past Surgical History:   Procedure Laterality Date   ARTHOSCOPIC ROTAOR CUFF REPAIR Left 04/23/2022   Procedure: ARTHROSCOPIC ROTATOR CUFF REPAIR;  Surgeon: Lyndle Herrlich, MD;  Location: [facility];  Service: Orthopedics;  Laterality: Left;   COLONOSCOPY N/A 11/22/2020   Procedure: COLONOSCOPY;  Surgeon: Pasty Spillers, MD;  Location: [facility];  Service: Endoscopy;  Laterality: N/A;   FOOT SURGERY Left 2001   Triple arthrodesis   KNEE ARTHROSCOPY WITH LATERAL MENISECTOMY Right 05/01/2021   Procedure: KNEE ARTHROSCOPY WITH LATERAL MENISECTOMY;  Surgeon: Kennedy Bucker, MD;  Location: [facility];  Service: Orthopedics;  Laterality: Right;   KNEE ARTHROSCOPY WITH MEDIAL MENISECTOMY Right 05/01/2021   Procedure: KNEE ARTHROSCOPY WITH MEDIAL MENISECTOMY;  Surgeon: Kennedy Bucker, MD;  Location: [facility];  Service: Orthopedics;  Laterality: Right;   QUADRICEPS TENDON REPAIR Right 07/14/2020   Procedure: REPAIR QUADRICEP TENDON;  Surgeon: Kennedy Bucker, MD;  Location: [facility];  Service: Orthopedics;  Laterality: Right;   QUADRICEPS TENDON REPAIR Right 05/01/2021   Procedure: REPAIR QUADRICEP TENDON;  Surgeon: Kennedy Bucker, MD;  Location: [facility];  Service: Orthopedics;  Laterality: Right;   REPAIR QUADRICEPS/HAMSTRING MUSCLES Right 05/04/2020   Procedure: REPAIR QUADRICEPS/HAMSTRING MUSCLES;  Surgeon: Kennedy Bucker, MD;  Location: [facility];  Service: Orthopedics;  Laterality: Right;  RIGHT QUADRICEPS   ROTATOR CUFF REPAIR Left 2009   SHOULDER ARTHROSCOPY Left 04/23/2022   Procedure: ARTHROSCOPY SHOULDER;  Surgeon: Lyndle Herrlich, MD;  Location: [facility];  Service: Orthopedics;  Laterality: Left;   SHOULDER FUSION SURGERY Left 2009   WRIST SURGERY Right    ganglion cyst   Social History   Socioeconomic History   Marital status: Married    Spouse name: Cordelia Pen   Number of children: 4   Years of education: Not on file   Highest education level: Not on file  Occupational History   Occupation: disability    Tobacco Use   Smoking status: Never   Smokeless tobacco: Never  Vaping Use   Vaping status: Never Used  Substance and Sexual Activity   Alcohol use: Yes    Comment: occasionally   Drug use: No   Sexual activity: Not on file  Other Topics Concern   Not on file  Social History Narrative   Not on file   Social Determinants of Health   Financial Resource Strain: Low Risk  (09/12/2022)   Overall Financial Resource Strain (CARDIA)    Difficulty of Paying Living Expenses: Not hard at all  Food Insecurity: No Food Insecurity (09/12/2022)   Hunger Vital Sign    Worried About Running Out of Food in the Last Year: Never true    Ran Out of Food in the Last Year: Never true  Transportation Needs: No Transportation Needs (09/12/2022)  PRAPARE - Administrator, Civil Service (Medical): No    Lack of Transportation (Non-Medical): No  Physical Activity: Insufficiently Active (09/12/2022)   Exercise Vital Sign    Days of Exercise per Week: 2 days    Minutes of Exercise per Session: 20 min  Stress: No Stress Concern Present (09/12/2022)   [employer] of Occupational Health - Occupational Stress Questionnaire    Feeling of Stress : Not at all  Social Connections: Moderately Isolated (09/12/2022)   Social Connection and Isolation Panel [NHANES]    Frequency of Communication with Friends and Family: More than three times a week    Frequency of Social Gatherings with Friends and Family: Not on file    Attends Religious Services: Never    Database administrator or Organizations: No    Attends Banker Meetings: Never    Marital Status: Married  Catering manager Violence: Not At Risk (09/12/2022)   Humiliation, Afraid, Rape, and Kick questionnaire    Fear of Current or Ex-Partner: No    Emotionally Abused: No    Physically Abused: No    Sexually Abused: No   Family History  Problem Relation Age of Onset   Hyperlipidemia Mother    Hypertension Mother    Diabetes Mother     Depression Mother    Hypertension Father    Hyperlipidemia Father    Cancer Father        skin cancer   Hypertension Brother    Diabetes Maternal Grandmother    Heart attack Maternal Grandfather    Current Outpatient Medications on File Prior to Visit  Medication Sig   acetaminophen (TYLENOL) 500 MG tablet Take 1,000 mg by mouth every 6 (six) hours as needed for moderate pain.   aspirin EC 81 MG tablet Take 81 mg by mouth daily.   colchicine 0.6 MG tablet Take 2 tablets = 1.2 mg at the first sign of gout flare, followed by 0.6 mg after 1 hour. Then every day after take 1 tablet daily for up to 7-10 days until gout flare resolved   diphenhydrAMINE (BENADRYL) 25 MG tablet Take 25 mg by mouth every 6 (six) hours as needed.   fluoruracil (CARAC) 0.5 % cream Apply twice daily to forehead and temples for 4-5 days and scalp for 5-6 days   Melatonin 10 MG TABS Take by mouth daily. qhs   sildenafil (REVATIO) 20 MG tablet Take 3-5 tablets as needed 30 prior to sex.   traZODone (DESYREL) 100 MG tablet Take 1 tablet (100 mg total) by mouth at bedtime. (Patient taking differently: Take 100 mg by mouth at bedtime as needed for sleep.)   vortioxetine HBr (TRINTELLIX) 10 MG TABS tablet Take 1 tablet (10 mg total) by mouth daily.   No current facility-administered medications on file prior to visit.    Review of Systems  Constitutional:  Negative for activity change, appetite change, chills, diaphoresis, fatigue and fever.  HENT:  Negative for congestion and hearing loss.   Eyes:  Negative for visual disturbance.  Respiratory:  Negative for cough, chest tightness, shortness of breath and wheezing.   Cardiovascular:  Negative for chest pain, palpitations and leg swelling.  Gastrointestinal:  Negative for abdominal pain, constipation, diarrhea, nausea and vomiting.  Genitourinary:  Negative for dysuria, frequency and hematuria.  Musculoskeletal:  Negative for arthralgias and neck pain.  Skin:   Negative for rash.  Neurological:  Negative for dizziness, weakness, light-headedness, numbness and headaches.  Hematological:  Negative  for adenopathy.  Psychiatric/Behavioral:  Positive for decreased concentration and dysphoric mood. Negative for behavioral problems and sleep disturbance. The patient is nervous/anxious.    Per HPI unless specifically indicated above      Objective:    BP 138/70   Pulse (!) 118   Ht 5\' 7"  (1.702 m)   Wt 207 lb (93.9 kg)   SpO2 98%   BMI 32.42 kg/m   Wt Readings from Last 3 Encounters:  02/14/23 207 lb (93.9 kg)  09/12/22 212 lb (96.2 kg)  06/11/22 212 lb 3.2 oz (96.3 kg)    Physical Exam Vitals and nursing note reviewed.  Constitutional:      General: He is not in acute distress.    Appearance: He is well-developed. He is not diaphoretic.     Comments: Well-appearing, comfortable, cooperative  HENT:     Head: Normocephalic and atraumatic.  Eyes:     General:        Right eye: No discharge.        Left eye: No discharge.     Conjunctiva/sclera: Conjunctivae normal.     Pupils: Pupils are equal, round, and reactive to light.  Neck:     Thyroid: No thyromegaly.  Cardiovascular:     Rate and Rhythm: Normal rate and regular rhythm.     Pulses: Normal pulses.     Heart sounds: Normal heart sounds. No murmur heard. Pulmonary:     Effort: Pulmonary effort is normal. No respiratory distress.     Breath sounds: Normal breath sounds. No wheezing or rales.  Abdominal:     General: Bowel sounds are normal. There is no distension.     Palpations: Abdomen is soft. There is no mass.     Tenderness: There is no abdominal tenderness.  Musculoskeletal:        General: No tenderness. Normal range of motion.     Cervical back: Normal range of motion and neck supple.     Comments: Upper / Lower Extremities: - Normal muscle tone, strength bilateral upper extremities 5/5, lower extremities 5/5  Lymphadenopathy:     Cervical: No cervical adenopathy.   Skin:    General: Skin is warm and dry.     Findings: No erythema or rash.  Neurological:     Mental Status: He is alert and oriented to person, place, and time.     Comments: Distal sensation intact to light touch all extremities  Psychiatric:        Mood and Affect: Mood normal.        Behavior: Behavior normal.        Thought Content: Thought content normal.     Comments: Well groomed, good eye contact, normal speech and thoughts      Results for orders placed or performed during the hospital encounter of 04/15/22  CBC  Result Value Ref Range   WBC 3.9 (L) 4.0 - 10.5 K/uL   RBC 4.66 4.22 - 5.81 MIL/uL   Hemoglobin 16.0 13.0 - 17.0 g/dL   HCT 14.7 82.9 - 56.2 %   MCV 92.9 80.0 - 100.0 fL   MCH 34.3 (H) 26.0 - 34.0 pg   MCHC 37.0 (H) 30.0 - 36.0 g/dL   RDW 13.0 86.5 - 78.4 %   Platelets 232 150 - 400 K/uL   nRBC 0.0 0.0 - 0.2 %  Basic metabolic panel  Result Value Ref Range   Sodium 134 (L) 135 - 145 mmol/L   Potassium 3.4 (L) 3.5 -  5.1 mmol/L   Chloride 99 98 - 111 mmol/L   CO2 24 22 - 32 mmol/L   Glucose, Bld 139 (H) 70 - 99 mg/dL   BUN 13 8 - 23 mg/dL   Creatinine, Ser 8.75 0.61 - 1.24 mg/dL   Calcium 9.2 8.9 - 64.3 mg/dL   GFR, Estimated >32 >95 mL/min   Anion gap 11 5 - 15      Assessment & Plan:   Problem List Items Addressed This Visit     Anxiety   Relevant Medications   busPIRone (BUSPAR) 10 MG tablet   Other Relevant Orders   Ambulatory referral to Psychology   Essential hypertension   Relevant Medications   amLODipine (NORVASC) 5 MG tablet   lisinopril-hydrochlorothiazide (ZESTORETIC) 20-25 MG tablet   pravastatin (PRAVACHOL) 40 MG tablet   Other Relevant Orders   CT CARDIAC SCORING (SELF PAY ONLY)   Lipid panel   Comprehensive metabolic panel   CBC with Differential/Platelet   Hyperlipidemia   Relevant Medications   amLODipine (NORVASC) 5 MG tablet   lisinopril-hydrochlorothiazide (ZESTORETIC) 20-25 MG tablet   pravastatin (PRAVACHOL) 40  MG tablet   Other Relevant Orders   CT CARDIAC SCORING (SELF PAY ONLY)   Lipid panel   TSH   Major depressive disorder, recurrent episode with anxious distress (HCC)   Relevant Medications   busPIRone (BUSPAR) 10 MG tablet   Other Relevant Orders   Ambulatory referral to Psychology   Pre-diabetes   Relevant Orders   Hemoglobin A1c   PTSD (post-traumatic stress disorder)   Relevant Medications   clonazePAM (KLONOPIN) 0.5 MG tablet   busPIRone (BUSPAR) 10 MG tablet   Other Relevant Orders   Ambulatory referral to Psychology   Screening for prostate cancer   Relevant Orders   PSA   Other Visit Diagnoses     Annual physical exam    -  Primary   Relevant Orders   Hemoglobin A1c   Lipid panel   PSA   TSH   Uric acid   Comprehensive metabolic panel   CBC with Differential/Platelet   Colon cancer screening       Relevant Orders   Ambulatory referral to Gastroenterology   Chronic gout without tophus, unspecified cause, unspecified site       Relevant Orders   Uric acid   Hypercholesterolemia       Relevant Medications   amLODipine (NORVASC) 5 MG tablet   lisinopril-hydrochlorothiazide (ZESTORETIC) 20-25 MG tablet   pravastatin (PRAVACHOL) 40 MG tablet       Updated Health Maintenance information Reviewed recent lab results with patient Encouraged improvement to lifestyle with diet and exercise Goal of weight loss  PTSD + Depression and Anxiety Experiencing increased stress due to family issues and loss of a close friend.  Currently on Trintellix 10mg  and Buspar, with inconsistent relief. Discussed the importance of therapy and the potential benefit of adding clonazepam for situational use. He has been successful on benzo previously when had severe mental health symptoms. He understands dosing and risk of dependence  -Referral to psychology for therapy. Return to previous location he was at 2018-2021. See below  -Start Clonazepam 0.5mg  up to twice a day as  needed. -Continue Trintellix 10mg  and Buspar as currently prescribed.  [facility] [address] [place], [place] [address] Tel: [phone] Fax: [phone]  Followed by Ortho Right Knee Quadriceps Tendon Repair Reports persistent weakness and difficulty with stairs post-surgery. No current  plan for further intervention. -Continue current management.  Shoulder Bursitis Reports improvement post-scope and biceps muscle repair. No current complaints. -Continue current management.  Weight Management Patient has lost 5 pounds in the past 5 months. Expressed interest in further weight loss options. -Explore options for injectable GLP weight loss medications. Check insurance coverage for Wegovy and Zepbound  Gout No current flare, but patient interested in checking uric acid level. -Order uric acid level with [facility].  Cardiovascular Health Discussed the option of a heart scan for blockage detection. -Order heart scan for coronary calcium score  Follow-up in 2-3 months to assess mood and mental health.     Orders Placed This Encounter  Procedures   CT CARDIAC SCORING (SELF PAY ONLY)    Standing Status:   Future    Standing Expiration Date:   02/14/2024    Order Specific Question:   Preferred imaging location?    Answer:   [facility]   Hemoglobin A1c   Lipid panel    Order Specific Question:   Has the patient fasted?    Answer:   Yes   PSA   TSH   Uric acid   Comprehensive metabolic panel    Order Specific Question:   Has the patient fasted?    Answer:   Yes   CBC with Differential/Platelet   Ambulatory referral to Gastroenterology    Referral Priority:   Routine    Referral Type:   Consultation    Referral Reason:   Specialty Services Required    Number of Visits Requested:   1   Ambulatory referral to Psychology    Referral Priority:   Routine    Referral Type:   Psychiatric    Referral Reason:   Specialty  Services Required    Requested Specialty:   Psychology    Number of Visits Requested:   1     Meds ordered this encounter  Medications   clonazePAM (KLONOPIN) 0.5 MG tablet    Sig: Take 1 tablet (0.5 mg total) by mouth 2 (two) times daily as needed for anxiety.    Dispense:  30 tablet    Refill:  2   amLODipine (NORVASC) 5 MG tablet    Sig: Take 1 tablet (5 mg total) by mouth daily.    Dispense:  90 tablet    Refill:  3    Add refills   lisinopril-hydrochlorothiazide (ZESTORETIC) 20-25 MG tablet    Sig: Take 1 tablet by mouth daily.    Dispense:  90 tablet    Refill:  3    Add extra refills   busPIRone (BUSPAR) 10 MG tablet    Sig: Take 1 tablet (10 mg total) by mouth 2 (two) times daily.    Dispense:  180 tablet    Refill:  3    Add future refills   pravastatin (PRAVACHOL) 40 MG tablet    Sig: Take 1 tablet (40 mg total) by mouth daily.    Dispense:  90 tablet    Refill:  3    Add refills future     Follow up plan: Return in about 2 months (around 04/16/2023) for 2-3 month Follow-up Mood/Anxiety, Form completion.  Saralyn Pilar, DO [facility] 02/14/2023, 11:04 AM

## 2023-02-14 NOTE — Patient Instructions (Addendum)
Thank you for coming to the office today.  New rx for Clonazepam 0.5mg  twice a day as needed, can use only once if you prefer.  Added refills for now.  Okay to take with Buspar  Keep on current dose Trintellix 10mg   Referral to [facility] therapist in [place] - stay tuned for apt.  -------  [facility] will call to schedule Colonoscopy  ----------------  Check into the rx for injectable GLP weight loss  For Weight Loss / Obesity only  Check insurance for coverage on:  Zepbound weekly injection Wegovy weekly injection  If you need the compounded:  Semaglutide injection (mixed Ozempic) from [employer] 0.25mg  weekly for 4 weeks then increase to 0.5mg  weekly It comes in a vial and a needle syringe, you need to draw up the shot and self admin it weekly Cost is about $200-300 per month Call them to check pricing and availability  [facility] Address: [address] [place], [place], [place] [address] Phone: [phone]   Please schedule a Follow-up Appointment to: Return in about 2 months (around 04/16/2023), or 2-3 month Follow-up Mood/Anxiety, Form completion, for 2-3 month Follow-up Mood/Anxiety, Form completion.  If you have any other questions or concerns, please feel free to call the office or send a message through MyChart. You may also schedule an earlier appointment if necessary.  Additionally, you may be receiving a survey about your experience at our office within a few days to 1 week by e-mail or mail. We value your feedback.  Saralyn Pilar, DO [facility], [place]

## 2023-02-18 DIAGNOSIS — E782 Mixed hyperlipidemia: Secondary | ICD-10-CM | POA: Diagnosis not present

## 2023-02-18 DIAGNOSIS — R7303 Prediabetes: Secondary | ICD-10-CM | POA: Diagnosis not present

## 2023-02-18 DIAGNOSIS — M1A9XX0 Chronic gout, unspecified, without tophus (tophi): Secondary | ICD-10-CM | POA: Diagnosis not present

## 2023-02-18 DIAGNOSIS — Z125 Encounter for screening for malignant neoplasm of prostate: Secondary | ICD-10-CM | POA: Diagnosis not present

## 2023-02-18 DIAGNOSIS — Z0000 Encounter for general adult medical examination without abnormal findings: Secondary | ICD-10-CM | POA: Diagnosis not present

## 2023-02-18 DIAGNOSIS — I10 Essential (primary) hypertension: Secondary | ICD-10-CM | POA: Diagnosis not present

## 2023-02-19 ENCOUNTER — Ambulatory Visit
Admission: RE | Admit: 2023-02-19 | Discharge: 2023-02-19 | Disposition: A | Discharge status: Home / Self Care | Payer: Medicare HMO | Source: Ambulatory Visit | Attending: Family Medicine | Admitting: Family Medicine

## 2023-02-19 ENCOUNTER — Telehealth: Payer: Self-pay

## 2023-02-19 ENCOUNTER — Other Ambulatory Visit: Payer: Self-pay

## 2023-02-19 DIAGNOSIS — Z1211 Encounter for screening for malignant neoplasm of colon: Secondary | ICD-10-CM

## 2023-02-19 DIAGNOSIS — I10 Essential (primary) hypertension: Secondary | ICD-10-CM | POA: Insufficient documentation

## 2023-02-19 DIAGNOSIS — E782 Mixed hyperlipidemia: Secondary | ICD-10-CM | POA: Insufficient documentation

## 2023-02-19 LAB — LIPID PANEL
Chol/HDL Ratio: 3.4 ratio (ref 0.0–5.0)
Cholesterol, Total: 196 mg/dL (ref 100–199)
HDL: 58 mg/dL (ref >39)
LDL Chol Calc (NIH): 107 mg/dL — ABNORMAL HIGH (ref 0–99)
Triglycerides: 182 mg/dL — ABNORMAL HIGH (ref 0–149)
VLDL Cholesterol Cal: 31 mg/dL (ref 5–40)

## 2023-02-19 LAB — CBC WITH DIFFERENTIAL/PLATELET
Basophils Absolute: 0.1 10*3/uL (ref 0.0–0.2)
Basos: 1 %
EOS (ABSOLUTE): 0.1 10*3/uL (ref 0.0–0.4)
Eos: 3 %
Hematocrit: 46.3 % (ref 37.5–51.0)
Hemoglobin: 16.6 g/dL (ref 13.0–17.7)
Immature Grans (Abs): 0 10*3/uL (ref 0.0–0.1)
Immature Granulocytes: 0 %
Lymphocytes Absolute: 1.2 10*3/uL (ref 0.7–3.1)
Lymphs: 21 %
MCH: 35.9 pg — ABNORMAL HIGH (ref 26.6–33.0)
MCHC: 35.9 g/dL — ABNORMAL HIGH (ref 31.5–35.7)
MCV: 100 fL — ABNORMAL HIGH (ref 79–97)
Monocytes Absolute: 0.8 10*3/uL (ref 0.1–0.9)
Monocytes: 14 %
Neutrophils Absolute: 3.5 10*3/uL (ref 1.4–7.0)
Neutrophils: 61 %
Platelets: 254 10*3/uL (ref 150–450)
RBC: 4.63 x10E6/uL (ref 4.14–5.80)
RDW: 12.3 % (ref 11.6–15.4)
WBC: 5.7 10*3/uL (ref 3.4–10.8)

## 2023-02-19 LAB — COMPREHENSIVE METABOLIC PANEL
ALT: 65 [IU]/L — ABNORMAL HIGH (ref 0–44)
AST: 65 [IU]/L — ABNORMAL HIGH (ref 0–40)
Albumin: 4.6 g/dL (ref 3.9–4.9)
Alkaline Phosphatase: 102 [IU]/L (ref 44–121)
BUN/Creatinine Ratio: 9 — ABNORMAL LOW (ref 10–24)
BUN: 10 mg/dL (ref 8–27)
Bilirubin Total: 0.8 mg/dL (ref 0.0–1.2)
CO2: 26 mmol/L (ref 20–29)
Calcium: 10.1 mg/dL (ref 8.6–10.2)
Chloride: 93 mmol/L — ABNORMAL LOW (ref 96–106)
Creatinine, Ser: 1.12 mg/dL (ref 0.76–1.27)
Globulin, Total: 2.9 g/dL (ref 1.5–4.5)
Glucose: 148 mg/dL — ABNORMAL HIGH (ref 70–99)
Potassium: 4.2 mmol/L (ref 3.5–5.2)
Sodium: 134 mmol/L (ref 134–144)
Total Protein: 7.5 g/dL (ref 6.0–8.5)
eGFR: 74 mL/min/{1.73_m2} (ref >59)

## 2023-02-19 LAB — URIC ACID: Uric Acid: 8.1 mg/dL (ref 3.8–8.4)

## 2023-02-19 LAB — HEMOGLOBIN A1C
Est. average glucose Bld gHb Est-mCnc: 151 mg/dL
Hgb A1c MFr Bld: 6.9 % — ABNORMAL HIGH (ref 4.8–5.6)

## 2023-02-19 LAB — TSH: TSH: 1.89 u[IU]/mL (ref 0.450–4.500)

## 2023-02-19 LAB — PSA: Prostate Specific Ag, Serum: 3 ng/mL (ref 0.0–4.0)

## 2023-02-19 NOTE — Telephone Encounter (Signed)
Patient left a voicemail at 11:12am on the main line that he was calling to schedule his colonoscopy that his PCP referred him to [place] for

## 2023-02-19 NOTE — Telephone Encounter (Signed)
Gastroenterology Pre-Procedure Review  Request Date: 03/13/23 Requesting Physician: Dr. Allegra Lai  PATIENT REVIEW QUESTIONS: The patient responded to the following health history questions as indicated:    1. Are you having any GI issues? no 2. Do you have a personal history of Polyps? no 3. Do you have a family history of Colon Cancer or Polyps? no 4. Diabetes Mellitus? no 5. Joint replacements in the past 12 months?no 6. Major health problems in the past 3 months?no 7. Any artificial heart valves, MVP, or defibrillator?no    MEDICATIONS & ALLERGIES:    Patient reports the following regarding taking any anticoagulation/antiplatelet therapy:   Plavix, Coumadin, Eliquis, Xarelto, Lovenox, Pradaxa, Brilinta, or Effient? no Aspirin? yes (81mg  daily)  Patient confirms/reports the following medications:  Current Outpatient Medications  Medication Sig Dispense Refill   acetaminophen (TYLENOL) 500 MG tablet Take 1,000 mg by mouth every 6 (six) hours as needed for moderate pain.     amLODipine (NORVASC) 5 MG tablet Take 1 tablet (5 mg total) by mouth daily. 90 tablet 3   aspirin EC 81 MG tablet Take 81 mg by mouth daily.     busPIRone (BUSPAR) 10 MG tablet Take 1 tablet (10 mg total) by mouth 2 (two) times daily. 180 tablet 3   clonazePAM (KLONOPIN) 0.5 MG tablet Take 1 tablet (0.5 mg total) by mouth 2 (two) times daily as needed for anxiety. 30 tablet 2   colchicine 0.6 MG tablet Take 2 tablets = 1.2 mg at the first sign of gout flare, followed by 0.6 mg after 1 hour. Then every day after take 1 tablet daily for up to 7-10 days until gout flare resolved 30 tablet 2   diphenhydrAMINE (BENADRYL) 25 MG tablet Take 25 mg by mouth every 6 (six) hours as needed.     fluoruracil (CARAC) 0.5 % cream Apply twice daily to forehead and temples for 4-5 days and scalp for 5-6 days     lisinopril-hydrochlorothiazide (ZESTORETIC) 20-25 MG tablet Take 1 tablet by mouth daily. 90 tablet 3   Melatonin 10 MG TABS  Take by mouth daily. qhs     pravastatin (PRAVACHOL) 40 MG tablet Take 1 tablet (40 mg total) by mouth daily. 90 tablet 3   sildenafil (REVATIO) 20 MG tablet Take 3-5 tablets as needed 30 prior to sex. 30 tablet 11   traZODone (DESYREL) 100 MG tablet Take 1 tablet (100 mg total) by mouth at bedtime. (Patient taking differently: Take 100 mg by mouth at bedtime as needed for sleep.) 90 tablet 3   vortioxetine HBr (TRINTELLIX) 10 MG TABS tablet Take 1 tablet (10 mg total) by mouth daily. 30 tablet 5   No current facility-administered medications for this visit.    Patient confirms/reports the following allergies:  No Known Allergies  No orders of the defined types were placed in this encounter.   AUTHORIZATION INFORMATION Primary Insurance: 1D#: Group #:  Secondary Insurance: 1D#: Group #:  SCHEDULE INFORMATION: Date: 03/13/23 Time: Location: [facility]

## 2023-02-24 ENCOUNTER — Telehealth: Payer: Self-pay | Admitting: Family Medicine

## 2023-02-24 ENCOUNTER — Other Ambulatory Visit: Payer: Self-pay | Admitting: Family Medicine

## 2023-02-24 DIAGNOSIS — E782 Mixed hyperlipidemia: Secondary | ICD-10-CM

## 2023-02-24 DIAGNOSIS — R931 Abnormal findings on diagnostic imaging of heart and coronary circulation: Secondary | ICD-10-CM

## 2023-02-24 MED ORDER — ATORVASTATIN CALCIUM 40 MG PO TABS: 40.0000 mg | ORAL_TABLET | Freq: Every day | ORAL | 3 refills | Status: DC

## 2023-02-24 NOTE — Telephone Encounter (Signed)
Please call to schedule apt 3-6 months for PreDM A1c check  Patient had elevated A1c 6.9 on last lab. Results were sent to patient, and we have talked to him about next test plan with referral to Cardiologist and Statin med change, but he does still need to schedule 3-6 month apt from last visit to repeat his A1c and follow-up to determine status if pre diabetic or diabetic.  Saralyn Pilar, DO [facility] 02/24/2023, 6:30 PM

## 2023-02-25 NOTE — Telephone Encounter (Signed)
Appointment scheduled.

## 2023-03-10 ENCOUNTER — Telehealth: Payer: Self-pay

## 2023-03-10 NOTE — Telephone Encounter (Signed)
Patient stopped by the office to discuss colonoscopy prep instructions.  Colonoscopy prep Golytely called to [facility].  Pt verbalized understanding of 2 day prep instructions.  Thanks,  [name], [place]

## 2023-03-13 ENCOUNTER — Encounter: Payer: Self-pay | Admitting: Gastroenterology

## 2023-03-13 ENCOUNTER — Ambulatory Visit
Admission: RE | Admit: 2023-03-13 | Discharge: 2023-03-13 | Disposition: A | Discharge status: Home / Self Care | Payer: Medicare HMO | Attending: Gastroenterology | Admitting: Gastroenterology

## 2023-03-13 ENCOUNTER — Ambulatory Visit: Payer: Medicare HMO | Admitting: Anesthesiology

## 2023-03-13 ENCOUNTER — Encounter
Admission: RE | Disposition: A | Discharge status: Home / Self Care | Payer: Self-pay | Source: Home / Self Care | Attending: Gastroenterology

## 2023-03-13 DIAGNOSIS — I10 Essential (primary) hypertension: Secondary | ICD-10-CM | POA: Insufficient documentation

## 2023-03-13 DIAGNOSIS — Z1211 Encounter for screening for malignant neoplasm of colon: Secondary | ICD-10-CM | POA: Diagnosis not present

## 2023-03-13 DIAGNOSIS — J45909 Unspecified asthma, uncomplicated: Secondary | ICD-10-CM | POA: Diagnosis not present

## 2023-03-13 DIAGNOSIS — K644 Residual hemorrhoidal skin tags: Secondary | ICD-10-CM | POA: Diagnosis not present

## 2023-03-13 DIAGNOSIS — K649 Unspecified hemorrhoids: Secondary | ICD-10-CM | POA: Diagnosis not present

## 2023-03-13 HISTORY — PX: COLONOSCOPY WITH PROPOFOL: SHX5780

## 2023-03-13 SURGERY — COLONOSCOPY WITH PROPOFOL
Anesthesia: General

## 2023-03-13 MED ORDER — PROPOFOL 500 MG/50ML IV EMUL
INTRAVENOUS | Status: DC | PRN
  Administered 2023-03-13: 150 ug/kg/min via INTRAVENOUS

## 2023-03-13 MED ORDER — PROPOFOL 1000 MG/100ML IV EMUL
INTRAVENOUS | Status: AC
  Filled 2023-03-13: qty 100

## 2023-03-13 MED ORDER — SODIUM CHLORIDE 0.9 % IV SOLN: INTRAVENOUS | Status: DC

## 2023-03-13 MED ORDER — PROPOFOL 10 MG/ML IV BOLUS
INTRAVENOUS | Status: DC | PRN
  Administered 2023-03-13: 100 mg via INTRAVENOUS

## 2023-03-13 NOTE — Anesthesia Postprocedure Evaluation (Signed)
Anesthesia Post Note  Patient: Jorge Jordan  Procedure(s) Performed: COLONOSCOPY WITH PROPOFOL  Patient location during evaluation: PACU Anesthesia Type: General Level of consciousness: awake and alert Pain management: pain level controlled Vital Signs Assessment: post-procedure vital signs reviewed and stable Respiratory status: spontaneous breathing, nonlabored ventilation, respiratory function stable and patient connected to nasal cannula oxygen Cardiovascular status: blood pressure returned to baseline and stable Postop Assessment: no apparent nausea or vomiting Anesthetic complications: no  There were no known notable events for this encounter.   Last Vitals:  Vitals:   03/13/23 1023 03/13/23 1033  BP:  125/87  Pulse: 97 87  Resp: 18   Temp:    SpO2: 96% 97%    Last Pain:  Vitals:   03/13/23 1033  TempSrc:   PainSc: 0-No pain                 Stephanie Coup

## 2023-03-13 NOTE — Anesthesia Preprocedure Evaluation (Signed)
Anesthesia Evaluation  Patient identified by MRN, date of birth, ID band Patient awake    Reviewed: Allergy & Precautions, NPO status , Patient's Chart, lab work & pertinent test results  History of Anesthesia Complications (+) PONV and history of anesthetic complications  Airway Mallampati: IV   Neck ROM: Full    Dental  (+) Missing   Pulmonary asthma    Pulmonary exam normal breath sounds clear to auscultation       Cardiovascular hypertension, Normal cardiovascular exam Rhythm:Regular Rate:Normal  ECG 04/15/22: normal   Neuro/Psych  PSYCHIATRIC DISORDERS (PTSD) Anxiety Depression    negative neurological ROS     GI/Hepatic negative GI ROS,,,  Endo/Other  negative endocrine ROS    Renal/GU      Musculoskeletal   Abdominal   Peds  Hematology negative hematology ROS (+)   Anesthesia Other Findings   Reproductive/Obstetrics                             Anesthesia Physical Anesthesia Plan  ASA: 2  Anesthesia Plan: General   Post-op Pain Management: Minimal or no pain anticipated   Induction: Intravenous  PONV Risk Score and Plan: 3 and Propofol infusion, TIVA and Ondansetron  Airway Management Planned: Nasal Cannula  Additional Equipment: None  Intra-op Plan:   Post-operative Plan:   Informed Consent: I have reviewed the patients History and Physical, chart, labs and discussed the procedure including the risks, benefits and alternatives for the proposed anesthesia with the patient or authorized representative who has indicated his/her understanding and acceptance.     Dental advisory given  Plan Discussed with: CRNA and Surgeon  Anesthesia Plan Comments: (Discussed risks of anesthesia with patient, including possibility of difficulty with spontaneous ventilation under anesthesia necessitating airway intervention, PONV, and rare risks such as cardiac or respiratory or  neurological events, and allergic reactions. Discussed the role of CRNA in patient's perioperative care. Patient understands.)       Anesthesia Quick Evaluation

## 2023-03-13 NOTE — Transfer of Care (Signed)
Immediate Anesthesia Transfer of Care Note  Patient: Jorge Jordan  Procedure(s) Performed: COLONOSCOPY WITH PROPOFOL  Patient Location: PACU  Anesthesia Type:General  Level of Consciousness: awake and sedated  Airway & Oxygen Therapy: Patient Spontanous Breathing and Patient connected to face mask oxygen  Post-op Assessment: Report given to RN and Post -op Vital signs reviewed and stable  Post vital signs: Reviewed and stable  Last Vitals:  Vitals Value Taken Time  BP    Temp    Pulse    Resp    SpO2      Last Pain:  Vitals:   03/13/23 0921  TempSrc: Temporal  PainSc: 0-No pain         Complications: There were no known notable events for this encounter.

## 2023-03-13 NOTE — Op Note (Signed)
[facility] Gastroenterology Patient Name: Jorge Jordan Procedure Date: 03/13/2023 9:43 AM MRN: [id number] Account #: [id number] Date of Birth: 10-Dec-1960 Admit Type: Outpatient Age: 62 Room: [facility] ENDO ROOM 3 Gender: Male Note Status: Finalized Instrument Name: Nelda Marseille [id number] Procedure:             Colonoscopy Indications:           Screening for colorectal malignant neoplasm, Screening                         for colorectal malignant neoplasm, inadequate bowel                         prep on last colonoscopy (more recent than 10 years                         ago), Last colonoscopy: August 2022 Providers:             Toney Reil MD, MD Referring MD:          Smitty Cords (Referring MD) Medicines:             General Anesthesia Complications:         No immediate complications. Estimated blood loss: None. Procedure:             Pre-Anesthesia Assessment:                        - Prior to the procedure, a History and Physical was                         performed, and patient medications and allergies were                         reviewed. The patient is competent. The risks and                         benefits of the procedure and the sedation options and                         risks were discussed with the patient. All questions                         were answered and informed consent was obtained.                         Patient identification and proposed procedure were                         verified by the physician, the nurse, the                         anesthesiologist, the anesthetist and the technician                         in the pre-procedure area in the procedure room in the                         endoscopy suite. Mental Status Examination: alert and  oriented. Airway Examination: normal oropharyngeal                         airway and neck mobility. Respiratory Examination:                          clear to auscultation. CV Examination: normal.                         Prophylactic Antibiotics: The patient does not require                         prophylactic antibiotics. Prior Anticoagulants: The                         patient has taken no anticoagulant or antiplatelet                         agents. ASA Grade Assessment: II - A patient with mild                         systemic disease. After reviewing the risks and                         benefits, the patient was deemed in satisfactory                         condition to undergo the procedure. The anesthesia                         plan was to use general anesthesia. Immediately prior                         to administration of medications, the patient was                         re-assessed for adequacy to receive sedatives. The                         heart rate, respiratory rate, oxygen saturations,                         blood pressure, adequacy of pulmonary ventilation, and                         response to care were monitored throughout the                         procedure. The physical status of the patient was                         re-assessed after the procedure.                        After obtaining informed consent, the colonoscope was                         passed under direct vision. Throughout the procedure,  the patient's blood pressure, pulse, and oxygen                         saturations were monitored continuously. The                         Colonoscope was introduced through the anus and                         advanced to the the cecum, identified by appendiceal                         orifice and ileocecal valve. The colonoscopy was                         performed without difficulty. The patient tolerated                         the procedure well. The quality of the bowel                         preparation was evaluated using the BBPS [facility] Bowel                          Preparation Scale) with scores of: Right Colon = 3,                         Transverse Colon = 3 and Left Colon = 3 (entire mucosa                         seen well with no residual staining, small fragments                         of stool or opaque liquid). The total BBPS score                         equals 9. The ileocecal valve, appendiceal orifice,                         and rectum were photographed. Findings:      The perianal and digital rectal examinations were normal. Pertinent       negatives include normal sphincter tone and no palpable rectal lesions.      The entire examined colon appeared normal.      Non-bleeding external hemorrhoids were found during retroflexion. The       hemorrhoids were medium-sized. Impression:            - The entire examined colon is normal.                        - Non-bleeding external hemorrhoids.                        - No specimens collected. Recommendation:        - Discharge patient to home (with escort).                        - Resume previous diet today.                        -  Continue present medications.                        - Repeat colonoscopy in 10 years for screening                         purposes. Procedure Code(s):     --- Professional ---                        Z6109, Colorectal cancer screening; colonoscopy on                         individual not meeting criteria for high risk Diagnosis Code(s):     --- Professional ---                        K64.4, Residual hemorrhoidal skin tags                        Z12.11, Encounter for screening for malignant neoplasm                         of colon CPT copyright 2022 American Medical Association. All rights reserved. The codes documented in this report are preliminary and upon coder review may  be revised to meet current compliance requirements. Dr. Libby Maw Toney Reil MD, MD 03/13/2023 10:13:03 AM This report has been signed electronically. Number of Addenda:  0 Note Initiated On: 03/13/2023 9:43 AM Scope Withdrawal Time: 0 hours 9 minutes 28 seconds  Total Procedure Duration: 0 hours 13 minutes 0 seconds  Estimated Blood Loss:  Estimated blood loss: none.      [facility]

## 2023-03-13 NOTE — H&P (Signed)
Arlyss Repress, MD [address]  [address]  [place], [place] [address]  Main: [phone]  Fax: [phone] Pager: [phone]  Primary Care Physician:  Smitty Cords, DO Primary Gastroenterologist:  Dr. Arlyss Repress  Pre-Procedure History & Physical: HPI:  Jorge Jordan is a 62 y.o. male is here for a colonoscopy.   Past Medical History:  Diagnosis Date   Allergy    Anxiety    Arthritis    Asthma    Cancer (HCC)    squamous cell   Depression    Foot deformity    foot arthrodesis L foot   Hyperlipidemia    Hypertension    Osteoporosis    PONV (postoperative nausea and vomiting)    Pre-diabetes    Shoulder bursitis    both    Tendinitis    elbow    Past Surgical History:  Procedure Laterality Date   ARTHOSCOPIC ROTAOR CUFF REPAIR Left 04/23/2022   Procedure: ARTHROSCOPIC ROTATOR CUFF REPAIR;  Surgeon: Lyndle Herrlich, MD;  Location: [facility] ORS;  Service: Orthopedics;  Laterality: Left;   COLONOSCOPY N/A 11/22/2020   Procedure: COLONOSCOPY;  Surgeon: Pasty Spillers, MD;  Location: [facility] ENDOSCOPY;  Service: Endoscopy;  Laterality: N/A;   FOOT SURGERY Left 2001   Triple arthrodesis   KNEE ARTHROSCOPY WITH LATERAL MENISECTOMY Right 05/01/2021   Procedure: KNEE ARTHROSCOPY WITH LATERAL MENISECTOMY;  Surgeon: Kennedy Bucker, MD;  Location: [facility] ORS;  Service: Orthopedics;  Laterality: Right;   KNEE ARTHROSCOPY WITH MEDIAL MENISECTOMY Right 05/01/2021   Procedure: KNEE ARTHROSCOPY WITH MEDIAL MENISECTOMY;  Surgeon: Kennedy Bucker, MD;  Location: [facility] ORS;  Service: Orthopedics;  Laterality: Right;   QUADRICEPS TENDON REPAIR Right 07/14/2020   Procedure: REPAIR QUADRICEP TENDON;  Surgeon: Kennedy Bucker, MD;  Location: [facility] ORS;  Service: Orthopedics;  Laterality: Right;   QUADRICEPS TENDON REPAIR Right 05/01/2021   Procedure: REPAIR QUADRICEP TENDON;  Surgeon: Kennedy Bucker, MD;  Location: [facility] ORS;  Service: Orthopedics;  Laterality: Right;    REPAIR QUADRICEPS/HAMSTRING MUSCLES Right 05/04/2020   Procedure: REPAIR QUADRICEPS/HAMSTRING MUSCLES;  Surgeon: Kennedy Bucker, MD;  Location: [facility] ORS;  Service: Orthopedics;  Laterality: Right;  RIGHT QUADRICEPS   ROTATOR CUFF REPAIR Left 2009   SHOULDER ARTHROSCOPY Left 04/23/2022   Procedure: ARTHROSCOPY SHOULDER;  Surgeon: Lyndle Herrlich, MD;  Location: [facility] ORS;  Service: Orthopedics;  Laterality: Left;   SHOULDER FUSION SURGERY Left 2009   WRIST SURGERY Right    ganglion cyst    Prior to Admission medications   Medication Sig Start Date End Date Taking? Authorizing Provider  amLODipine (NORVASC) 5 MG tablet Take 1 tablet (5 mg total) by mouth daily. 02/14/23  Yes Karamalegos, Netta Neat, DO  aspirin EC 81 MG tablet Take 81 mg by mouth daily.   Yes [provider]  atorvastatin (LIPITOR) 40 MG tablet Take 1 tablet (40 mg total) by mouth at bedtime. 02/24/23  Yes Karamalegos, Netta Neat, DO  busPIRone (BUSPAR) 10 MG tablet Take 1 tablet (10 mg total) by mouth 2 (two) times daily. 02/14/23  Yes Karamalegos, Netta Neat, DO  lisinopril-hydrochlorothiazide (ZESTORETIC) 20-25 MG tablet Take 1 tablet by mouth daily. 02/14/23  Yes Karamalegos, Netta Neat, DO  acetaminophen (TYLENOL) 500 MG tablet Take 1,000 mg by mouth every 6 (six) hours as needed for moderate pain.    [provider]  clonazePAM (KLONOPIN) 0.5 MG tablet Take 1 tablet (0.5 mg total) by mouth 2 (two) times daily as needed for anxiety. 02/14/23  Karamalegos, Alexander J, DO  colchicine 0.6 MG tablet Take 2 tablets = 1.2 mg at the first sign of gout flare, followed by 0.6 mg after 1 hour. Then every day after take 1 tablet daily for up to 7-10 days until gout flare resolved 06/11/22   Smitty Cords, DO  diphenhydrAMINE (BENADRYL) 25 MG tablet Take 25 mg by mouth every 6 (six) hours as needed.    [provider]  fluoruracil [facility]) 0.5 % cream Apply twice daily to forehead and temples for 4-5  days and scalp for 5-6 days 07/20/21   [provider]  Melatonin 10 MG TABS Take by mouth daily. qhs    [provider]  sildenafil (REVATIO) 20 MG tablet Take 3-5 tablets as needed 30 prior to sex. 02/07/22   Karamalegos, Netta Neat, DO  traZODone (DESYREL) 100 MG tablet Take 1 tablet (100 mg total) by mouth at bedtime. Patient taking differently: Take 100 mg by mouth at bedtime as needed for sleep. 11/08/20   Karamalegos, Netta Neat, DO  vortioxetine HBr (TRINTELLIX) 10 MG TABS tablet Take 1 tablet (10 mg total) by mouth daily. 07/02/17   Smitty Cords, DO    Allergies as of 02/20/2023   (No Known Allergies)    Family History  Problem Relation Age of Onset   Hyperlipidemia Mother    Hypertension Mother    Diabetes Mother    Depression Mother    Hypertension Father    Hyperlipidemia Father    Cancer Father        skin cancer   Hypertension Brother    Diabetes Maternal Grandmother    Heart attack Maternal Grandfather     Social History   Socioeconomic History   Marital status: Married    Spouse name: Cordelia Pen   Number of children: 4   Years of education: Not on file   Highest education level: Not on file  Occupational History   Occupation: disability   Tobacco Use   Smoking status: Never   Smokeless tobacco: Never  Vaping Use   Vaping status: Never Used  Substance and Sexual Activity   Alcohol use: Yes    Comment: occasionally   Drug use: No   Sexual activity: Not on file  Other Topics Concern   Not on file  Social History Narrative   Not on file   Social Determinants of Health   Financial Resource Strain: Low Risk  (09/12/2022)   Overall Financial Resource Strain (CARDIA)    Difficulty of Paying Living Expenses: Not hard at all  Food Insecurity: No Food Insecurity (09/12/2022)   Hunger Vital Sign    Worried About Running Out of Food in the Last Year: Never true    Ran Out of Food in the Last Year: Never true  Transportation Needs: No  Transportation Needs (09/12/2022)   PRAPARE - Administrator, Civil Service (Medical): No    Lack of Transportation (Non-Medical): No  Physical Activity: Insufficiently Active (09/12/2022)   Exercise Vital Sign    Days of Exercise per Week: 2 days    Minutes of Exercise per Session: 20 min  Stress: No Stress Concern Present (09/12/2022)   [employer] of Occupational Health - Occupational Stress Questionnaire    Feeling of Stress : Not at all  Social Connections: Moderately Isolated (09/12/2022)   Social Connection and Isolation Panel [NHANES]    Frequency of Communication with Friends and Family: More than three times a week    Frequency  of Social Gatherings with Friends and Family: Not on file    Attends Religious Services: Never    Active Member of Clubs or Organizations: No    Attends Banker Meetings: Never    Marital Status: Married  Catering manager Violence: Not At Risk (09/12/2022)   Humiliation, Afraid, Rape, and Kick questionnaire    Fear of Current or Ex-Partner: No    Emotionally Abused: No    Physically Abused: No    Sexually Abused: No    Review of Systems: See HPI, otherwise negative ROS  Physical Exam: BP (!) 125/92   Pulse (!) 105   Temp (!) 96.7 F (35.9 C) (Temporal)   Resp 18   Ht 5\' 7"  (1.702 m)   Wt 94.3 kg   SpO2 98%   BMI 32.58 kg/m  General:   Alert,  pleasant and cooperative in NAD Head:  Normocephalic and atraumatic. Neck:  Supple; no masses or thyromegaly. Lungs:  Clear throughout to auscultation.    Heart:  Regular rate and rhythm. Abdomen:  Soft, nontender and nondistended. Normal bowel sounds, without guarding, and without rebound.   Neurologic:  Alert and  oriented x4;  grossly normal neurologically.  Impression/Plan: Elyse Jarvis is here for a colonoscopy to be performed for colon cancer screening  Risks, benefits, limitations, and alternatives regarding  colonoscopy have been reviewed with the patient.   Questions have been answered.  All parties agreeable.   Lannette Donath, MD  03/13/2023, 9:47 AM

## 2023-03-14 ENCOUNTER — Encounter: Payer: Self-pay | Admitting: Gastroenterology

## 2023-03-31 ENCOUNTER — Other Ambulatory Visit: Payer: Self-pay | Admitting: Family Medicine

## 2023-03-31 DIAGNOSIS — I10 Essential (primary) hypertension: Secondary | ICD-10-CM

## 2023-04-01 NOTE — Telephone Encounter (Signed)
Rx 02/14/23 #90 3RF- too soon Requested Prescriptions  Pending Prescriptions Disp Refills   lisinopril-hydrochlorothiazide (ZESTORETIC) 20-25 MG tablet [Pharmacy Med Name: LISINOPRIL-HCTZ 20-25 MG TAB] 90 tablet 3    Sig: TAKE 1 TABLET BY MOUTH DAILY     Cardiovascular:  ACEI + Diuretic Combos Passed - 04/01/2023  2:04 PM      Passed - Na in normal range and within 180 days    Sodium  Date Value Ref Range Status  02/18/2023 134 134 - 144 mmol/L Final         Passed - K in normal range and within 180 days    Potassium  Date Value Ref Range Status  02/18/2023 4.2 3.5 - 5.2 mmol/L Final         Passed - Cr in normal range and within 180 days    Creatinine, Ser  Date Value Ref Range Status  02/18/2023 1.12 0.76 - 1.27 mg/dL Final         Passed - eGFR is 30 or above and within 180 days    GFR calc Af Amer  Date Value Ref Range Status  10/21/2019 73 >59 mL/min/1.73 Final    Comment:    **[facility] currently reports eGFR in compliance with the current**   recommendations of the [employer]. [facility] will   update reporting as new guidelines are published from the NKF-ASN   Task force.    GFR, Estimated  Date Value Ref Range Status  04/15/2022 >60 >60 mL/min Final    Comment:    (NOTE) Calculated using the CKD-EPI Creatinine Equation (2021)    eGFR  Date Value Ref Range Status  02/18/2023 74 >59 mL/min/1.73 Final         Passed - Patient is not pregnant      Passed - Last BP in normal range    BP Readings from Last 1 Encounters:  03/13/23 125/87         Passed - Valid encounter within last 6 months    Recent Outpatient Visits           1 month ago Annual physical exam   [place] [facility], Netta Neat, DO   9 months ago Acute gout involving toe of left foot, unspecified cause   [place] [facility] Smitty Cords, DO   1 year ago Chronic left shoulder pain   [place] [facility] Smitty Cords, DO   1 year ago Annual physical exam   [place] [facility] Smitty Cords, DO   2 years ago Annual physical exam   [place] [facility] Smitty Cords, DO       Future Appointments             In 1 month Agbor-Etang, Arlys John, MD [facility] [place]   In 2 months Althea Charon, Netta Neat, DO [place] [facility]

## 2023-04-03 ENCOUNTER — Other Ambulatory Visit: Payer: Self-pay | Admitting: Family Medicine

## 2023-04-03 DIAGNOSIS — I10 Essential (primary) hypertension: Secondary | ICD-10-CM

## 2023-04-03 DIAGNOSIS — F4310 Post-traumatic stress disorder, unspecified: Secondary | ICD-10-CM

## 2023-04-03 NOTE — Telephone Encounter (Signed)
[facility] called [facility] stated they never recvd a script from [facility] for  busPIRone (BUSPAR) 10 MG tablet  amLODipine (NORVASC) 5 MG tablet lisinopril-hydrochlorothiazide (ZESTORETIC) 20-25 MG tablet  clonazePAM (KLONOPIN) 0.5 MG tablet and pt would need to contact provider. Please f/u with pharmacy.

## 2023-04-08 NOTE — Telephone Encounter (Signed)
 Requested medication (s) are due for refill today:yes  Requested medication (s) are on the active medication list: yes  Last refill:  02/14/23 #30 2 RF  Future visit scheduled: no  Notes to clinic:  med not delegated to NT to RF   Requested Prescriptions  Pending Prescriptions Disp Refills   clonazePAM  (KLONOPIN ) 0.5 MG tablet 30 tablet     Sig: Take 1 tablet (0.5 mg total) by mouth 2 (two) times daily as needed for anxiety.     Not Delegated - Psychiatry: Anxiolytics/Hypnotics 2 Failed - 04/08/2023  1:47 PM      Failed - This refill cannot be delegated      Failed - Urine Drug Screen completed in last 360 days      Passed - Patient is not pregnant      Passed - Valid encounter within last 6 months    Recent Outpatient Visits           1 month ago Annual physical exam   [place] [facility] [place], Marsa PARAS, DO   10 months ago Acute gout involving toe of left foot, unspecified cause   [place] [facility] Marsa PARAS, DO   1 year ago Chronic left shoulder pain   [place] [facility] Edman Marsa PARAS, DO   1 year ago Annual physical exam   [place] [facility] Edman Marsa PARAS, DO   2 years ago Annual physical exam   [place] [facility] Edman Marsa PARAS, DO       Future Appointments             In 1 month Agbor-Etang, Redell, MD [facility] [place]   In 2 months [place], Marsa PARAS, DO [place] [facility]            Refused Prescriptions Disp Refills   amLODipine  (NORVASC ) 5 MG tablet 90 tablet 3    Sig: Take 1 tablet (5 mg total) by mouth daily.     Cardiovascular: Calcium  Channel Blockers 2 Passed - 04/08/2023  1:47 PM      Passed - Last BP in normal range    BP Readings from Last 1 Encounters:  03/13/23 125/87         Passed - Last Heart Rate in normal range    Pulse  Readings from Last 1 Encounters:  03/13/23 87         Passed - Valid encounter within last 6 months    Recent Outpatient Visits           1 month ago Annual physical exam   [place] [facility] [place], Marsa PARAS, DO   10 months ago Acute gout involving toe of left foot, unspecified cause   [place] [facility] [place], Marsa PARAS, DO   1 year ago Chronic left shoulder pain   [place] [facility] Edman Marsa PARAS, DO   1 year ago Annual physical exam   [place] Ed[place] Marsa PARAS, DO   2 years ago Annual physical exam   [place] [facility] Marsa PARAS, DO       Future Appointments             In 1 month Agbor-Etang, Redell, MD [facility] [place]   In 2 months Edman Marsa PARAS,  DO [place] [facility]             busPIRone  (BUSPAR ) 10 MG tablet 180 tablet 3    Sig: Take 1 tablet (10 mg total) by mouth 2 (two) times daily.     Psychiatry: Anxiolytics/Hypnotics - Non-controlled Passed - 04/08/2023  1:47 PM      Passed - Valid encounter within last 12 months    Recent Outpatient Visits           1 month ago Annual physical exam   [place] [facility] [place], Marsa PARAS, DO   10 months ago Acute gout involving toe of left foot, unspecified cause   [place] [facility] Ed[facility] Marsa PARAS, DO   1 year ago Chronic left shoulder pain   [place] [facility] Ed[facility] Marsa PARAS, DO   1 year ago Annual physical exam   [place] [facility] Ed[facility] Marsa PARAS, DO   2 years ago Annual physical exam   [place] [facility] Marsa PARAS, DO       Future Appointments             In 1 month Agbor-Etang, Redell, MD [facility] [place]   In 2  months Edman, Marsa PARAS, DO [place] [facility]             lisinopril -hydrochlorothiazide  (ZESTORETIC ) 20-25 MG tablet 90 tablet 3    Sig: Take 1 tablet by mouth daily.     Cardiovascular:  ACEI + Diuretic Combos Passed - 04/08/2023  1:47 PM      Passed - Na in normal range and within 180 days    Sodium  Date Value Ref Range Status  02/18/2023 134 134 - 144 mmol/L Final         Passed - K in normal range and within 180 days    Potassium  Date Value Ref Range Status  02/18/2023 4.2 3.5 - 5.2 mmol/L Final         Passed - Cr in normal range and within 180 days    Creatinine, Ser  Date Value Ref Range Status  02/18/2023 1.12 0.76 - 1.27 mg/dL Final         Passed - eGFR is 30 or above and within 180 days    GFR calc Af Amer  Date Value Ref Range Status  10/21/2019 73 >59 mL/min/1.73 Final    Comment:    **[facility] currently reports eGFR in compliance with the current**   recommendations of the [employer]. [facility] will   update reporting as new guidelines are published from the NKF-ASN   Task force.    GFR, Estimated  Date Value Ref Range Status  04/15/2022 >60 >60 mL/min Final    Comment:    (NOTE) Calculated using the CKD-EPI Creatinine Equation (2021)    eGFR  Date Value Ref Range Status  02/18/2023 74 >59 mL/min/1.73 Final         Passed - Patient is not pregnant      Passed - Last BP in normal range    BP Readings from Last 1 Encounters:  03/13/23 125/87         Passed - Valid encounter within last 6 months    Recent Outpatient Visits           1 month ago Annual physical exam     [facility], Marsa PARAS, DO   10 months ago Acute gout involving toe of left foot, unspecified cause   [place] [facility] [place], Marsa PARAS, DO   1 year ago Chronic left shoulder pain   [place] [facility] Marsa PARAS, DO    1 year ago Annual physical exam   [place] [facility] Ed[facility] Marsa PARAS, DO   2 years ago Annual physical exam   [place] [facility] Marsa PARAS, DO       Future Appointments             In 1 month Agbor-Etang, Redell, MD [facility] [place]   In 2 months Edman, Marsa PARAS, DO [place] [facility]

## 2023-04-08 NOTE — Telephone Encounter (Signed)
 Requested Prescriptions  Pending Prescriptions Disp Refills   amLODipine  (NORVASC ) 5 MG tablet 90 tablet 3    Sig: Take 1 tablet (5 mg total) by mouth daily.     Cardiovascular: Calcium  Channel Blockers 2 Passed - 04/08/2023  1:45 PM      Passed - Last BP in normal range    BP Readings from Last 1 Encounters:  03/13/23 125/87         Passed - Last Heart Rate in normal range    Pulse Readings from Last 1 Encounters:  03/13/23 87         Passed - Valid encounter within last 6 months    Recent Outpatient Visits           1 month ago Annual physical exam   [place] [facility] [place], Marsa PARAS, DO   10 months ago Acute gout involving toe of left foot, unspecified cause   [place] [facility] [place], Marsa PARAS, DO   1 year ago Chronic left shoulder pain   [place] [facility] Marsa PARAS, DO   1 year ago Annual physical exam   [place] [facility] Marsa PARAS, DO   2 years ago Annual physical exam   [place] [facility] Ed[place] Marsa PARAS, DO       Future Appointments             In 1 month Agbor-Etang, Redell, MD [facility] [place]   In 2 months Edman, Marsa PARAS, DO [place] [facility]             busPIRone  (BUSPAR ) 10 MG tablet 180 tablet 3    Sig: Take 1 tablet (10 mg total) by mouth 2 (two) times daily.     Psychiatry: Anxiolytics/Hypnotics - Non-controlled Passed - 04/08/2023  1:45 PM      Passed - Valid encounter within last 12 months    Recent Outpatient Visits           1 month ago Annual physical exam   [place] [facility] [place], Marsa PARAS, DO   10 months ago Acute gout involving toe of left foot, unspecified cause   [place] [facility] Marsa PARAS, DO   1 year ago Chronic left shoulder pain   [place]  [facility] Ed[place] Marsa PARAS, DO   1 year ago Annual physical exam   [place] [facility] Marsa PARAS, DO   2 years ago Annual physical exam   [place] [facility] Ed[facility] Marsa PARAS, DO       Future Appointments             In 1 month Agbor-Etang, Redell, MD [facility] [place]   In 2 months Edman, Marsa PARAS, DO [facility]             clonazePAM  (KLONOPIN ) 0.5 MG tablet 30 tablet     Sig: Take 1 tablet (0.5 mg total) by mouth 2 (two) times daily as needed for anxiety.     Not Delegated - Psychiatry: Anxiolytics/Hypnotics 2 Failed - 04/08/2023  1:45 PM      Failed - This refill cannot be delegated      Failed - Urine Drug Screen completed in last 360 days  Passed - Patient is not pregnant      Passed - Valid encounter within last 6 months    Recent Outpatient Visits           1 month ago Annual physical exam   [place] [facility] Marsa PARAS, DO   10 months ago Acute gout involving toe of left foot, unspecified cause   [place] [facility] Marsa PARAS, DO   1 year ago Chronic left shoulder pain   [place] [facility] Marsa PARAS, DO   1 year ago Annual physical exam   [place] [facility] Edman Marsa PARAS, DO   2 years ago Annual physical exam   [place] [facility] Marsa PARAS, DO       Future Appointments             In 1 month Agbor-Etang, Redell, MD [facility] [place]   In 2 months Edman, Marsa PARAS, DO [place] [facility]             lisinopril -hydrochlorothiazide  (ZESTORETIC ) 20-25 MG tablet 90 tablet 3    Sig: Take 1 tablet by mouth daily.     Cardiovascular:  ACEI + Diuretic Combos Passed - 04/08/2023  1:45  PM      Passed - Na in normal range and within 180 days    Sodium  Date Value Ref Range Status  02/18/2023 134 134 - 144 mmol/L Final         Passed - K in normal range and within 180 days    Potassium  Date Value Ref Range Status  02/18/2023 4.2 3.5 - 5.2 mmol/L Final         Passed - Cr in normal range and within 180 days    Creatinine, Ser  Date Value Ref Range Status  02/18/2023 1.12 0.76 - 1.27 mg/dL Final         Passed - eGFR is 30 or above and within 180 days    GFR calc Af Amer  Date Value Ref Range Status  10/21/2019 73 >59 mL/min/1.73 Final    Comment:    **[facility] currently reports eGFR in compliance with the current**   recommendations of the [employer]. [facility] will   update reporting as new guidelines are published from the NKF-ASN   Task force.    GFR, Estimated  Date Value Ref Range Status  04/15/2022 >60 >60 mL/min Final    Comment:    (NOTE) Calculated using the CKD-EPI Creatinine Equation (2021)    eGFR  Date Value Ref Range Status  02/18/2023 74 >59 mL/min/1.73 Final         Passed - Patient is not pregnant      Passed - Last BP in normal range    BP Readings from Last 1 Encounters:  03/13/23 125/87         Passed - Valid encounter within last 6 months    Recent Outpatient Visits           1 month ago Annual physical exam   [place] [facility] [place] , Marsa PARAS, DO   10 months ago Acute gout involving toe of left foot, unspecified cause   [place] [facility] [place] Marsa PARAS, DO   1 year ago Chronic left shoulder pain    [facility] [place],  Marsa PARAS, DO   1 year ago Annual physical exam   [place] [facility] [place] Marsa PARAS, DO   2 years ago Annual physical exam   [place] [facility] Ed[place] Marsa PARAS, DO       Future Appointments             In 1 month Agbor-Etang,  [place], MD [facility] [place]   In 2 months Edman, Marsa PARAS, DO [place] [facility]

## 2023-05-15 ENCOUNTER — Telehealth: Payer: Self-pay | Admitting: *Deleted

## 2023-05-15 NOTE — Telephone Encounter (Signed)
 Lmovm to verify card hx.

## 2023-05-16 ENCOUNTER — Ambulatory Visit: Payer: Medicare HMO | Attending: Cardiology | Admitting: Cardiology

## 2023-05-16 ENCOUNTER — Encounter: Payer: Self-pay | Admitting: Cardiology

## 2023-05-16 VITALS — BP 118/72 | HR 98 | Ht 67.0 in | Wt 202.0 lb

## 2023-05-16 DIAGNOSIS — E782 Mixed hyperlipidemia: Secondary | ICD-10-CM

## 2023-05-16 DIAGNOSIS — R0609 Other forms of dyspnea: Secondary | ICD-10-CM

## 2023-05-16 DIAGNOSIS — I2510 Atherosclerotic heart disease of native coronary artery without angina pectoris: Secondary | ICD-10-CM | POA: Diagnosis not present

## 2023-05-16 DIAGNOSIS — I10 Essential (primary) hypertension: Secondary | ICD-10-CM

## 2023-05-16 MED ORDER — METOPROLOL TARTRATE 100 MG PO TABS: 100.0000 mg | ORAL_TABLET | Freq: Once | ORAL | 0 refills | Status: DC

## 2023-05-16 MED ORDER — IVABRADINE HCL 5 MG PO TABS: 15.0000 mg | ORAL_TABLET | Freq: Once | ORAL | 0 refills | Status: AC

## 2023-05-16 NOTE — Progress Notes (Signed)
 Cardiology Office Note:    Date:  05/16/2023   ID:  Jorge Jordan, DOB 03/23/1961, MRN [id number]  PCP:  Edman Marsa PARAS, DO    [facility] Providers Cardiologist:  Redell Cave, MD     Referring MD: Edman Marsa *   No chief complaint on file.   History of Present Illness:    Jorge Jordan is a 63 y.o. male with a hx of CAD (LAD, LCx, RCA coronary calcifications), hypertension, hyperlipidemia who presents due to coronary calcifications.  Patient had a screening calcium  scan 02/19/2023 showing score of 672, 91st percentile.    Patient was started on Lipitor 40 mg after calcium  score was obtained.  Also takes aspirin 81 mg daily.  He denies chest pain, endorses dyspnea on exertion especially in the summer months.  Mother had a heart attack in her 53s.  He denies smoking.  Compliant with medications as prescribed.  Denies chest pain, palpitations, edema.  Past Medical History:  Diagnosis Date   Allergy    Anxiety    Arthritis    Asthma    Cancer (HCC)    squamous cell   Depression    Foot deformity    foot arthrodesis L foot   Hyperlipidemia    Hypertension    Osteoporosis    PONV (postoperative nausea and vomiting)    Pre-diabetes    Shoulder bursitis    both    Tendinitis    elbow    Past Surgical History:  Procedure Laterality Date   ARTHOSCOPIC ROTAOR CUFF REPAIR Left 04/23/2022   Procedure: ARTHROSCOPIC ROTATOR CUFF REPAIR;  Surgeon: Leora Lynwood SAUNDERS, MD;  Location: [facility];  Service: Orthopedics;  Laterality: Left;   COLONOSCOPY N/A 11/22/2020   Procedure: COLONOSCOPY;  Surgeon: Janalyn Keene NOVAK, MD;  Location: [facility];  Service: Endoscopy;  Laterality: N/A;   COLONOSCOPY WITH PROPOFOL  N/A 03/13/2023   Procedure: COLONOSCOPY WITH PROPOFOL ;  Surgeon: Unk Corinn Skiff, MD;  Location: [facility];  Service: Gastroenterology;  Laterality: N/A;   FOOT SURGERY Left 2001   Triple arthrodesis   KNEE ARTHROSCOPY WITH  LATERAL MENISECTOMY Right 05/01/2021   Procedure: KNEE ARTHROSCOPY WITH LATERAL MENISECTOMY;  Surgeon: Kathlynn Sharper, MD;  Location: [facility];  Service: Orthopedics;  Laterality: Right;   KNEE ARTHROSCOPY WITH MEDIAL MENISECTOMY Right 05/01/2021   Procedure: KNEE ARTHROSCOPY WITH MEDIAL MENISECTOMY;  Surgeon: Kathlynn Sharper, MD;  Location: [facility];  Service: Orthopedics;  Laterality: Right;   QUADRICEPS TENDON REPAIR Right 07/14/2020   Procedure: REPAIR QUADRICEP TENDON;  Surgeon: Kathlynn Sharper, MD;  Location: [facility];  Service: Orthopedics;  Laterality: Right;   QUADRICEPS TENDON REPAIR Right 05/01/2021   Procedure: REPAIR QUADRICEP TENDON;  Surgeon: Kathlynn Sharper, MD;  Location: [facility];  Service: Orthopedics;  Laterality: Right;   REPAIR QUADRICEPS/HAMSTRING MUSCLES Right 05/04/2020   Procedure: REPAIR QUADRICEPS/HAMSTRING MUSCLES;  Surgeon: Kathlynn Sharper, MD;  Location: [facility];  Service: Orthopedics;  Laterality: Right;  RIGHT QUADRICEPS   ROTATOR CUFF REPAIR Left 2009   SHOULDER ARTHROSCOPY Left 04/23/2022   Procedure: ARTHROSCOPY SHOULDER;  Surgeon: Leora Lynwood SAUNDERS, MD;  Location: [facility];  Service: Orthopedics;  Laterality: Left;   SHOULDER FUSION SURGERY Left 2009   WRIST SURGERY Right    ganglion cyst    Current Medications: Current Meds  Medication Sig   acetaminophen  (TYLENOL ) 500 MG tablet Take 1,000 mg by mouth every 6 (six) hours as needed for moderate pain.   amLODipine  (NORVASC ) 5 MG tablet Take 1 tablet (  5 mg total) by mouth daily.   aspirin EC 81 MG tablet Take 81 mg by mouth daily.   atorvastatin  (LIPITOR) 40 MG tablet Take 1 tablet (40 mg total) by mouth at bedtime.   busPIRone  (BUSPAR ) 10 MG tablet Take 1 tablet (10 mg total) by mouth 2 (two) times daily.   clonazePAM  (KLONOPIN ) 0.5 MG tablet Take 1 tablet (0.5 mg total) by mouth 2 (two) times daily as needed for anxiety.   colchicine  0.6 MG tablet Take 2 tablets = 1.2 mg at the first sign of gout flare, followed  by 0.6 mg after 1 hour. Then every day after take 1 tablet daily for up to 7-10 days until gout flare resolved   diphenhydrAMINE (BENADRYL) 25 MG tablet Take 25 mg by mouth every 6 (six) hours as needed.   fluoruracil (CARAC) 0.5 % cream Apply twice daily to forehead and temples for 4-5 days and scalp for 5-6 days   ivabradine  (CORLANOR) 5 MG TABS tablet Take 3 tablets (15 mg total) by mouth once for 1 dose. TWO HOURS PRIOR TO CARDIAC CT   lisinopril -hydrochlorothiazide  (ZESTORETIC ) 20-25 MG tablet Take 1 tablet by mouth daily.   Melatonin 10 MG TABS Take by mouth daily. qhs   metoprolol  tartrate (LOPRESSOR ) 100 MG tablet Take 1 tablet (100 mg total) by mouth once for 1 dose. TWO HOURS PRIOR TO CARDIAC CT   sildenafil  (REVATIO ) 20 MG tablet Take 3-5 tablets as needed 30 prior to sex.   traZODone  (DESYREL ) 100 MG tablet Take 1 tablet (100 mg total) by mouth at bedtime. (Patient taking differently: Take 100 mg by mouth at bedtime as needed for sleep.)   vortioxetine  HBr (TRINTELLIX ) 10 MG TABS tablet Take 1 tablet (10 mg total) by mouth daily.     Allergies:   Patient has no known allergies.   Social History   Socioeconomic History   Marital status: Married    Spouse name: [name]   Number of children: 4   Years of education: Not on file   Highest education level: Not on file  Occupational History   Occupation: disability   Tobacco Use   Smoking status: Never   Smokeless tobacco: Never  Vaping Use   Vaping status: Never Used  Substance and Sexual Activity   Alcohol use: Yes    Comment: occasionally   Drug use: No   Sexual activity: Not on file  Other Topics Concern   Not on file  Social History Narrative   Not on file   Social Drivers of Health   Financial Resource Strain: Low Risk  (09/12/2022)   Overall Financial Resource Strain (CARDIA)    Difficulty of Paying Living Expenses: Not hard at all  Food Insecurity: No Food Insecurity (09/12/2022)   Hunger Vital Sign    Worried  About Running Out of Food in the Last Year: Never true    Ran Out of Food in the Last Year: Never true  Transportation Needs: No Transportation Needs (09/12/2022)   PRAPARE - Administrator, Civil Service (Medical): No    Lack of Transportation (Non-Medical): No  Physical Activity: Insufficiently Active (09/12/2022)   Exercise Vital Sign    Days of Exercise per Week: 2 days    Minutes of Exercise per Session: 20 min  Stress: No Stress Concern Present (09/12/2022)   [employer] of Occupational Health - Occupational Stress Questionnaire    Feeling of Stress : Not at all  Social Connections: Moderately Isolated (09/12/2022)  Social Advertising Account Executive [NHANES]    Frequency of Communication with Friends and Family: More than three times a week    Frequency of Social Gatherings with Friends and Family: Not on file    Attends Religious Services: Never    Database Administrator or Organizations: No    Attends Engineer, Structural: Never    Marital Status: Married     Family History: The patient's family history includes Cancer in his father; Depression in his mother; Diabetes in his maternal grandmother and mother; Heart attack in his maternal grandfather; Hyperlipidemia in his father and mother; Hypertension in his brother, father, and mother.  ROS:   Please see the history of present illness.     All other systems reviewed and are negative.  EKGs/Labs/Other Studies Reviewed:    The following studies were reviewed today:  EKG Interpretation Date/Time:  Friday May 16 2023 14:05:55 EST Ventricular Rate:  103 PR Interval:  158 QRS Duration:  84 QT Interval:  354 QTC Calculation: 463 R Axis:   12  Text Interpretation: Sinus tachycardia Confirmed by Darliss Rogue ([id number]) on 05/16/2023 2:30:23 PM    Recent Labs: 02/18/2023: ALT 65; BUN 10; Creatinine, Ser 1.12; Hemoglobin 16.6; Platelets 254; Potassium 4.2; Sodium 134; TSH 1.890  Recent Lipid  Panel    Component Value Date/Time   CHOL 196 02/18/2023 1023   TRIG 182 (H) 02/18/2023 1023   HDL 58 02/18/2023 1023   CHOLHDL 3.4 02/18/2023 1023   LDLCALC 107 (H) 02/18/2023 1023     Risk Assessment/Calculations:             Physical Exam:    VS:  BP 118/72   Pulse 98   Ht 5' 7 (1.702 m)   Wt 202 lb (91.6 kg)   SpO2 95%   BMI 31.64 kg/m     Wt Readings from Last 3 Encounters:  05/16/23 202 lb (91.6 kg)  03/13/23 208 lb (94.3 kg)  02/14/23 207 lb (93.9 kg)     GEN:  Well nourished, well developed in no acute distress HEENT: Normal NECK: No JVD; No carotid bruits CARDIAC: RRR, no murmurs, rubs, gallops RESPIRATORY:  Clear to auscultation without rales, wheezing or rhonchi  ABDOMEN: Soft, non-tender, non-distended MUSCULOSKELETAL:  No edema; No deformity  SKIN: Warm and dry NEUROLOGIC:  Alert and oriented x 3 PSYCHIATRIC:  Normal affect   ASSESSMENT:    1. Dyspnea on exertion   2. Coronary artery disease involving native coronary artery of native heart, unspecified whether angina present   3. Primary hypertension   4. Mixed hyperlipidemia    PLAN:    In order of problems listed above:  Dyspnea on exertion, several risk factors, this could be an anginal equivalent.  Get echo, get coronary CTA. Coronary calcification, calcium  score 672.  Continue aspirin 81 mg daily, Lipitor 40 mg daily.  Ischemic workup as above. Hypertension, BP controlled.  Continue Norvasc  5 mg daily, Zestoretic  20-25 mg daily. Hyperlipidemia, Lipitor 40 mg daily.  Follow-up after cardiac testing.     Medication Adjustments/Labs and Tests Ordered: Current medicines are reviewed at length with the patient today.  Concerns regarding medicines are outlined above.  Orders Placed This Encounter  Procedures   CT CORONARY MORPH W/CTA COR W/SCORE W/CA W/CM &/OR WO/CM   Basic Metabolic Panel (BMET)   EKG 12-Lead   ECHOCARDIOGRAM COMPLETE   Meds ordered this encounter  Medications    metoprolol  tartrate (LOPRESSOR ) 100 MG tablet  Sig: Take 1 tablet (100 mg total) by mouth once for 1 dose. TWO HOURS PRIOR TO CARDIAC CT    Dispense:  1 tablet    Refill:  0   ivabradine  (CORLANOR) 5 MG TABS tablet    Sig: Take 3 tablets (15 mg total) by mouth once for 1 dose. TWO HOURS PRIOR TO CARDIAC CT    Dispense:  3 tablet    Refill:  0    CASH PRICE PLEASE - JUST A ONE TIME DOSE    Patient Instructions  Medication Instructions:   Your physician recommends that you continue on your current medications as directed. Please refer to the Current Medication list given to you today.  *If you need a refill on your cardiac medications before your next appointment, please call your pharmacy*   Lab Work:  Your physician recommends you have labs - BMP   If you have labs (blood work) drawn today and your tests are completely normal, you will receive your results only by: MyChart Message (if you have MyChart) OR A paper copy in the mail If you have any lab test that is abnormal or we need to change your treatment, we will call you to review the results.   Testing/Procedures:  Your physician has requested that you have an echocardiogram. Echocardiography is a painless test that uses sound waves to create images of your heart. It provides your doctor with information about the size and shape of your heart and how well your heart's chambers and valves are working. This procedure takes approximately one hour. There are no restrictions for this procedure. Please do NOT wear cologne, perfume, aftershave, or lotions (deodorant is allowed). Please arrive 15 minutes prior to your appointment time.  Please note: We ask at that you not bring children with you during ultrasound (echo/ vascular) testing. Due to room size and safety concerns, children are not allowed in the ultrasound rooms during exams. Our front office staff cannot provide observation of children in our lobby area while testing is  being conducted. An adult accompanying a patient to their appointment will only be allowed in the ultrasound room at the discretion of the ultrasound technician under special circumstances. We apologize for any inconvenience.    Your cardiac CT will be scheduled at one of the below locations:   [facility] [address] [place], [place] [address] [phone]  [facility], please arrive 15 mins early for check-in and test prep.  There is spacious parking and easy access to the radiology department from the [facility] V[facility] entrance. Please enter here and check-in with the desk attendant.   Please follow these instructions carefully (unless otherwise directed):  An IV will be required for this test and Nitroglycerin  will be given.  Hold all erectile dysfunction medications at least 3 days (72 hrs) prior to test. (Ie viagra , cialis, sildenafil , tadalafil, etc)   On the Night Before the Test: Be sure to Drink plenty of water. Do not consume any caffeinated/decaffeinated beverages or chocolate 12 hours prior to your test. Do not take any antihistamines 12 hours prior to your test.  On the Day of the Test: Drink plenty of water until 1 hour prior to the test. Do not eat any food 1 hour prior to test. HOLD sildenafil  (REVATIO ) 20 MG tablet 3 days prior to cardiac CT HOLD lisinopril -hydrochlorothiazide  (ZESTORETIC ) 20-25 MG tablet the morning of your Cardiac CT Take metoprolol  (Lopressor ) two hours prior to cardiac  ct Take Corlanor two hours prior to cardiac ct Patients who wear a continuous glucose monitor MUST remove the device prior to scanning.       After the Test: Drink plenty of water. After receiving IV contrast, you may experience a mild flushed feeling. This is normal. On occasion, you may experience a mild rash up to 24 hours after the test. This is not dangerous. If this occurs, you can take Benadryl 25 mg, Zyrtec,  Claritin, or Allegra and increase your fluid intake. (Patients taking Tikosyn should avoid Benadryl, and may take Zyrtec, Claritin, or Allegra) If you experience trouble breathing, this can be serious. If it is severe call 911 IMMEDIATELY. If it is mild, please call our office.  We will call to schedule your test 2-4 weeks out understanding that some insurance companies will need an authorization prior to the service being performed.   For more information and frequently asked questions, please visit our website : [email or web address]  For non-scheduling related questions, please contact the cardiac imaging nurse navigator should you have any questions/concerns: Cardiac Imaging Nurse Navigators Direct Office Dial: [phone]   For scheduling needs, including cancellations and rescheduling, please call Brittany, [phone].   Follow-Up: At [facility], you and your health needs are our priority.  As part of our continuing mission to provide you with exceptional heart care, we have created designated Provider Care Teams.  These Care Teams include your primary Cardiologist (physician) and Advanced Practice Providers (APPs -  Physician Assistants and Nurse Practitioners) who all work together to provide you with the care you need, when you need it.  We recommend signing up for the patient portal called MyChart.  Sign up information is provided on this After Visit Summary.  MyChart is used to connect with patients for Virtual Visits (Telemedicine).  Patients are able to view lab/test results, encounter notes, upcoming appointments, etc.  Non-urgent messages can be sent to your provider as well.   To learn more about what you can do with MyChart, go to [email or web address].    Your next appointment:   8 week(s)  Provider:   You may see Redell Cave, MD or one of the following Advanced Practice Providers on your designated Care Team:   Lonni Meager,  NP Bernardino Bring, PA-C Cadence Franchester, PA-C Tylene Lunch, NP Barnie Hila, NP    Signed, Redell Cave, MD  05/16/2023 2:51 PM    [facility]

## 2023-05-16 NOTE — Patient Instructions (Addendum)
 Medication Instructions:   Your physician recommends that you continue on your current medications as directed. Please refer to the Current Medication list given to you today.  *If you need a refill on your cardiac medications before your next appointment, please call your pharmacy*   Lab Work:  Your physician recommends you have labs - BMP   If you have labs (blood work) drawn today and your tests are completely normal, you will receive your results only by: MyChart Message (if you have MyChart) OR A paper copy in the mail If you have any lab test that is abnormal or we need to change your treatment, we will call you to review the results.   Testing/Procedures:  Your physician has requested that you have an echocardiogram. Echocardiography is a painless test that uses sound waves to create images of your heart. It provides your doctor with information about the size and shape of your heart and how well your heart's chambers and valves are working. This procedure takes approximately one hour. There are no restrictions for this procedure. Please do NOT wear cologne, perfume, aftershave, or lotions (deodorant is allowed). Please arrive 15 minutes prior to your appointment time.  Please note: We ask at that you not bring children with you during ultrasound (echo/ vascular) testing. Due to room size and safety concerns, children are not allowed in the ultrasound rooms during exams. Our front office staff cannot provide observation of children in our lobby area while testing is being conducted. An adult accompanying a patient to their appointment will only be allowed in the ultrasound room at the discretion of the ultrasound technician under special circumstances. We apologize for any inconvenience.    Your cardiac CT will be scheduled at one of the below locations:   [facility] [address] [place], [place] [address] [phone]  [facility], please arrive 15 mins early for check-in and test prep.  There is spacious parking and easy access to the radiology department from the [facility] V[facility] entrance. Please enter here and check-in with the desk attendant.   Please follow these instructions carefully (unless otherwise directed):  An IV will be required for this test and Nitroglycerin  will be given.  Hold all erectile dysfunction medications at least 3 days (72 hrs) prior to test. (Ie viagra , cialis, sildenafil , tadalafil, etc)   On the Night Before the Test: Be sure to Drink plenty of water. Do not consume any caffeinated/decaffeinated beverages or chocolate 12 hours prior to your test. Do not take any antihistamines 12 hours prior to your test.  On the Day of the Test: Drink plenty of water until 1 hour prior to the test. Do not eat any food 1 hour prior to test. HOLD sildenafil  (REVATIO ) 20 MG tablet 3 days prior to cardiac CT HOLD lisinopril -hydrochlorothiazide  (ZESTORETIC ) 20-25 MG tablet the morning of your Cardiac CT Take metoprolol  (Lopressor ) two hours prior to cardiac ct Take Corlanor two hours prior to cardiac ct Patients who wear a continuous glucose monitor MUST remove the device prior to scanning.       After the Test: Drink plenty of water. After receiving IV contrast, you may experience a mild flushed feeling. This is normal. On occasion, you may experience a mild rash up to 24 hours after the test. This is not dangerous. If this occurs, you can take Benadryl 25 mg, Zyrtec, Claritin, or Allegra and increase your fluid intake. (Patients taking Tikosyn should avoid Benadryl,  and may take Zyrtec, Claritin, or Allegra) If you experience trouble breathing, this can be serious. If it is severe call 911 IMMEDIATELY. If it is mild, please call our office.  We will call to schedule your test 2-4 weeks out understanding that some insurance companies will need an authorization prior to the service being  performed.   For more information and frequently asked questions, please visit our website : [email or web address]  For non-scheduling related questions, please contact the cardiac imaging nurse navigator should you have any questions/concerns: Cardiac Imaging Nurse Navigators Direct Office Dial: [phone]   For scheduling needs, including cancellations and rescheduling, please call Raquan Iannone, [phone].   Follow-Up: At [facility], you and your health needs are our priority.  As part of our continuing mission to provide you with exceptional heart care, we have created designated Provider Care Teams.  These Care Teams include your primary Cardiologist (physician) and Advanced Practice Providers (APPs -  Physician Assistants and Nurse Practitioners) who all work together to provide you with the care you need, when you need it.  We recommend signing up for the patient portal called MyChart.  Sign up information is provided on this After Visit Summary.  MyChart is used to connect with patients for Virtual Visits (Telemedicine).  Patients are able to view lab/test results, encounter notes, upcoming appointments, etc.  Non-urgent messages can be sent to your provider as well.   To learn more about what you can do with MyChart, go to [email or web address].    Your next appointment:   8 week(s)  Provider:   You may see Redell Cave, MD or one of the following Advanced Practice Providers on your designated Care Team:   Lonni Meager, NP Bernardino Bring, PA-C Cadence Franchester, PA-C Tylene Lunch, NP Barnie Hila, NP

## 2023-05-17 LAB — BASIC METABOLIC PANEL
BUN/Creatinine Ratio: 7 — ABNORMAL LOW (ref 10–24)
BUN: 8 mg/dL (ref 8–27)
CO2: 26 mmol/L (ref 20–29)
Calcium: 10 mg/dL (ref 8.6–10.2)
Chloride: 95 mmol/L — ABNORMAL LOW (ref 96–106)
Creatinine, Ser: 1.13 mg/dL (ref 0.76–1.27)
Glucose: 138 mg/dL — ABNORMAL HIGH (ref 70–99)
Potassium: 4.2 mmol/L (ref 3.5–5.2)
Sodium: 139 mmol/L (ref 134–144)
eGFR: 73 mL/min/{1.73_m2} (ref >59)

## 2023-05-22 ENCOUNTER — Encounter (HOSPITAL_COMMUNITY): Payer: Self-pay

## 2023-05-26 ENCOUNTER — Ambulatory Visit
Admission: RE | Admit: 2023-05-26 | Discharge: 2023-05-26 | Disposition: A | Discharge status: Home / Self Care | Payer: Medicare HMO | Source: Ambulatory Visit | Attending: Cardiology | Admitting: Cardiology

## 2023-05-26 DIAGNOSIS — I2510 Atherosclerotic heart disease of native coronary artery without angina pectoris: Secondary | ICD-10-CM | POA: Diagnosis not present

## 2023-05-26 DIAGNOSIS — R0609 Other forms of dyspnea: Secondary | ICD-10-CM | POA: Diagnosis not present

## 2023-05-26 MED ORDER — IOHEXOL 350 MG/ML SOLN
80.0000 mL | Freq: Once | INTRAVENOUS | Status: AC | PRN
  Administered 2023-05-26: 80 mL via INTRAVENOUS

## 2023-05-26 MED ORDER — METOPROLOL TARTRATE 5 MG/5ML IV SOLN: 10.0000 mg | Freq: Once | INTRAVENOUS | Status: DC | PRN

## 2023-05-26 MED ORDER — NITROGLYCERIN 0.4 MG SL SUBL
0.8000 mg | SUBLINGUAL_TABLET | Freq: Once | SUBLINGUAL | Status: AC
  Administered 2023-05-26: 0.8 mg via SUBLINGUAL
  Filled 2023-05-26: qty 25

## 2023-05-26 MED ORDER — DILTIAZEM HCL 25 MG/5ML IV SOLN: 10.0000 mg | INTRAVENOUS | Status: DC | PRN

## 2023-05-26 NOTE — Progress Notes (Signed)
 Patient presents for a cardiac CT and tolerated procedure without incident. Patient maintained acceptable vital signs, denies symptoms.  Patient ambulated out of department with a steady gait.

## 2023-06-04 ENCOUNTER — Ambulatory Visit: Payer: Medicare HMO | Attending: Cardiology

## 2023-06-04 DIAGNOSIS — R0609 Other forms of dyspnea: Secondary | ICD-10-CM | POA: Diagnosis not present

## 2023-06-04 DIAGNOSIS — I2510 Atherosclerotic heart disease of native coronary artery without angina pectoris: Secondary | ICD-10-CM | POA: Diagnosis not present

## 2023-06-04 LAB — ECHOCARDIOGRAM COMPLETE
AR max vel: 3.28 cm2
AV Area VTI: 3.65 cm2
AV Area mean vel: 3.29 cm2
AV Mean grad: 3 mmHg
AV Peak grad: 5.4 mmHg
Ao pk vel: 1.16 m/s
Area-P 1/2: 5.2 cm2
Calc EF: 55.1 %
S' Lateral: 3.5 cm
Single Plane A2C EF: 56.3 %
Single Plane A4C EF: 55.5 %

## 2023-06-25 ENCOUNTER — Encounter: Payer: Self-pay | Admitting: Family Medicine

## 2023-06-25 ENCOUNTER — Ambulatory Visit (INDEPENDENT_AMBULATORY_CARE_PROVIDER_SITE_OTHER): Payer: Medicare HMO | Admitting: Family Medicine

## 2023-06-25 VITALS — BP 130/84 | HR 106 | Ht 67.0 in | Wt 203.0 lb

## 2023-06-25 DIAGNOSIS — R000 Tachycardia, unspecified: Secondary | ICD-10-CM

## 2023-06-25 DIAGNOSIS — R972 Elevated prostate specific antigen [PSA]: Secondary | ICD-10-CM

## 2023-06-25 DIAGNOSIS — F339 Major depressive disorder, recurrent, unspecified: Secondary | ICD-10-CM

## 2023-06-25 DIAGNOSIS — I10 Essential (primary) hypertension: Secondary | ICD-10-CM | POA: Diagnosis not present

## 2023-06-25 DIAGNOSIS — E7800 Pure hypercholesterolemia, unspecified: Secondary | ICD-10-CM

## 2023-06-25 DIAGNOSIS — R7303 Prediabetes: Secondary | ICD-10-CM | POA: Diagnosis not present

## 2023-06-25 LAB — POCT GLYCOSYLATED HEMOGLOBIN (HGB A1C): Hemoglobin A1C: 6.9 % — AB (ref 4.0–5.6)

## 2023-06-25 NOTE — Progress Notes (Signed)
 Subjective:    Patient ID: Jorge Jordan, male    DOB: 25-Nov-1960, 63 y.o.   MRN: [id number]  Jorge Jordan is a 63 y.o. male presenting on 06/25/2023 for Pre-Diabetes, Major Depression PTSD and Elevated PSA   HPI  Discussed the use of AI scribe software for clinical note transcription with the patient, who gave verbal consent to proceed.  History of Present Illness   Jorge Jordan is a 63 year old male with type 2 diabetes who presents for follow-up on blood sugar control and PSA monitoring.  Pre-Diabetes / Elevated A1c Obesity BMI >31 He is here for follow-up on blood sugar control. His HbA1c has increased to 6.9% from previous values of 6.5%, 6.0%, 6.0%, and 6.2% over the past four years. He is actively working on weight loss, currently weighing 200.1 pounds, down from 203 pounds. His dietary changes include eating two meals a day, one of which is usually a salad, and reducing portion sizes.   Elevated PSA He is also being monitored for elevated PSA levels. His PSA has increased to 3.0 from previous readings of 1.5 and 1.2. He has no urinary symptoms to report.  Tachycardia He has a history of tachycardia since age 36 and experiences anxiety and stress when his heart rate is elevated. A medication provided for a cardiology appointment for procedure reduced his heart rate to 57-59 bpm from 90-100 bpm. But he is interested in adjusting current meds Upcoming apt 07/14/23 with Cardiology  CHRONIC HTN: BP recently elevated with stress Current Meds - Lisinopril-HCTZ 20-25mg  daily, Amlodipine 5mg  daily Reports good compliance, took meds today. Tolerating well, w/o complaints. Denies CP, dyspnea, HA, edema, dizziness / lightheadedness   HYPERLIPIDEMIA: History fatty liver - Currently taking Pravastatin 40mg , tolerating well without side effects or myalgias   Hepatic Steatosis Elevated LFTs previously   Major Depression, chronic recurrent / Anxiety + PTSD Improved on  therapy Continues on Trintellix 10mg  daily, Trazodone 100mg  nightly AS NEEDED for insomnia using about 1x weekly, Buspirone 10mg  TWICE A DAY regularly but less relief. He did better on Clonazepam in the past when he had more severe symptoms. Trintellix rx authorized on his manufacturer discount next due later this year.        06/25/2023   10:42 AM 02/14/2023   11:00 AM 09/12/2022    1:36 PM  Depression screen PHQ 2/9  Decreased Interest 1 1 1   Down, Depressed, Hopeless 1 2 2   PHQ - 2 Score 2 3 3   Altered sleeping 1 2 2   Tired, decreased energy 1 2 2   Change in appetite 1 1 0  Feeling bad or failure about yourself  1 2 0  Trouble concentrating 1 0 0  Moving slowly or fidgety/restless 0 0 0  Suicidal thoughts 0 0 0  PHQ-9 Score 7 10 7   Difficult doing work/chores Somewhat difficult Somewhat difficult Somewhat difficult       06/25/2023   10:42 AM 02/14/2023   11:00 AM 06/11/2022    3:40 PM 02/07/2022    9:02 AM  GAD 7 : Generalized Anxiety Score  Nervous, Anxious, on Edge 1 1 1 1   Control/stop worrying 1 2 1 2   Worry too much - different things 2 2 2 2   Trouble relaxing 2 2 1 2   Restless 1 0 2 1  Easily annoyed or irritable 1 1 0 1  Afraid - awful might happen 1 1 0 2  Total GAD 7 Score 9 9 7  11  Anxiety Difficulty Somewhat difficult  Somewhat difficult Somewhat difficult    Social History   Tobacco Use   Smoking status: Never   Smokeless tobacco: Never  Vaping Use   Vaping status: Never Used  Substance Use Topics   Alcohol use: Yes    Comment: occasionally   Drug use: No    Review of Systems Per HPI unless specifically indicated above     Objective:    BP 130/84 (BP Location: Left Arm, Patient Position: Sitting)   Pulse (!) 106   Ht 5\' 7"  (1.702 m)   Wt 203 lb (92.1 kg)   SpO2 99%   BMI 31.79 kg/m   Wt Readings from Last 3 Encounters:  06/25/23 203 lb (92.1 kg)  05/16/23 202 lb (91.6 kg)  03/13/23 208 lb (94.3 kg)    Physical Exam Vitals and nursing  note reviewed.  Constitutional:      General: He is not in acute distress.    Appearance: He is well-developed. He is obese. He is not diaphoretic.     Comments: Well-appearing, comfortable, cooperative  HENT:     Head: Normocephalic and atraumatic.  Eyes:     General:        Right eye: No discharge.        Left eye: No discharge.     Conjunctiva/sclera: Conjunctivae normal.  Neck:     Thyroid: No thyromegaly.  Cardiovascular:     Rate and Rhythm: Normal rate and regular rhythm.     Pulses: Normal pulses.     Heart sounds: Normal heart sounds. No murmur heard. Pulmonary:     Effort: Pulmonary effort is normal. No respiratory distress.     Breath sounds: Normal breath sounds. No wheezing or rales.  Musculoskeletal:        General: Normal range of motion.     Cervical back: Normal range of motion and neck supple.     Right lower leg: No edema.     Left lower leg: No edema.  Lymphadenopathy:     Cervical: No cervical adenopathy.  Skin:    General: Skin is warm and dry.     Findings: No erythema or rash.  Neurological:     Mental Status: He is alert and oriented to person, place, and time. Mental status is at baseline.  Psychiatric:        Behavior: Behavior normal.     Comments: Well groomed, good eye contact, normal speech and thoughts     Diabetic Foot Exam - Simple   Simple Foot Form Diabetic Foot exam was performed with the following findings: Yes 06/25/2023 10:50 AM  Visual Inspection See comments: Yes Sensation Testing Intact to touch and monofilament testing bilaterally: Yes Pulse Check Posterior Tibialis and Dorsalis pulse intact bilaterally: Yes Comments Left foot with inner aspect of L great toe plantar thick callus formation.      Results for orders placed or performed in visit on 06/25/23  POCT HgB A1C   Collection Time: 06/25/23 10:45 AM  Result Value Ref Range   Hemoglobin A1C 6.9 (A) 4.0 - 5.6 %   HbA1c POC (<> result, manual entry)     HbA1c,  POC (prediabetic range)     HbA1c, POC (controlled diabetic range)        Assessment & Plan:   Problem List Items Addressed This Visit     Essential hypertension   Relevant Orders   TSH   Comprehensive metabolic panel   CBC with Differential/Platelet  Major depressive disorder, recurrent episode with anxious distress (HCC)   Type 2 diabetes mellitus with other specified complication (HCC) - Primary   Other Visit Diagnoses       Elevated PSA       Relevant Orders   PSA     Tachycardia       Relevant Orders   TSH     Hypercholesterolemia       Relevant Orders   Lipid panel   TSH         Pre-Diabetes vs Suspected Type 2 Diabetes Mellitus Repeat reading A1c at 6.9%, consistent with type 2 diabetes. Potential for lifestyle management, but patient interested in aggressive treatment. Metformin and GLP-1 receptor agonists discussed. - Order hemoglobin A1c test for [facility] he will get this lab (at no cost, to confirm) - Discuss metformin, start 500 mg daily with meal, increase to twice daily if tolerated. - Discuss GLP-1 receptor agonists, benefits and risks. - Recommend annual diabetic eye exam, urine test for kidney function, foot examination for neuropathy. - Perform foot examination today.  Tachycardia Long-standing tachycardia. Patient interested in medication. Upcoming cardiologist appointment for management discussion. - Recommend discussing heart rate management with cardiologist. - Consider adjusting blood pressure medication based on cardiologist's recommendations. Possible to switch type of CCB vs switch to Beta blocker.  Elevated PSA PSA increased to 3.0, mild elevation, no urinary symptoms. Monitoring for further increases. - Order PSA test to recheck levels. - If PSA remains elevated or increases, consider referral to urologist.      Major Depression Recurrent / PTSD / Anxiety Controlled in Trintellix 10mg  daily, complete patient assistance paperwork today  return to patient Continue Buspar, Trazodone, Clonazepam AS NEEDED      Orders Placed This Encounter  Procedures   PSA   Urine Microalbumin w/creat. ratio   Lipid panel    Has the patient fasted?:   Yes   TSH   Comprehensive metabolic panel    Has the patient fasted?:   Yes   Hemoglobin A1c   CBC with Differential/Platelet   POCT HgB A1C    No orders of the defined types were placed in this encounter.   Follow up plan: Return in 3 months (on 09/25/2023) for 3 month DM A1c.   Saralyn Pilar, DO [facility] 06/25/2023, 10:49 AM

## 2023-06-25 NOTE — Patient Instructions (Addendum)
 Thank you for coming to the office today.  Ask Dr Azucena Cecil about the beta blocker / heart rate medicine, may adjust your current BP med and swap or switch or add  Concern for Diabetes  Recent Labs    02/18/23 1023 06/25/23 1045  HGBA1C 6.9* 6.9*   Lab test again to confirm.  If Sugar is >6.5 again consistent with diabetes, new rx Metformin  500mg  daily with meal, start once daily for 1-2 weeks if tolerated go up to one pill twice a day with meal.  ---------------------------------  PSA mild elevated, we will check again, if elevated further >3 we can refer to Urologist.  Form completion for Trintellix   Please schedule a Follow-up Appointment to: Return if symptoms worsen or fail to improve.  If you have any other questions or concerns, please feel free to call the office or send a message through MyChart. You may also schedule an earlier appointment if necessary.  Additionally, you may be receiving a survey about your experience at our office within a few days to 1 week by e-mail or mail. We value your feedback.  Saralyn Pilar, DO [facility], [place]

## 2023-06-26 ENCOUNTER — Other Ambulatory Visit: Payer: Self-pay | Admitting: Family Medicine

## 2023-06-26 ENCOUNTER — Encounter: Payer: Self-pay | Admitting: Family Medicine

## 2023-06-26 DIAGNOSIS — E1169 Type 2 diabetes mellitus with other specified complication: Secondary | ICD-10-CM

## 2023-06-26 LAB — CBC WITH DIFFERENTIAL/PLATELET
Basophils Absolute: 0.1 10*3/uL (ref 0.0–0.2)
Basos: 1 %
EOS (ABSOLUTE): 0.1 10*3/uL (ref 0.0–0.4)
Eos: 1 %
Hematocrit: 46.2 % (ref 37.5–51.0)
Hemoglobin: 16.7 g/dL (ref 13.0–17.7)
Immature Grans (Abs): 0 10*3/uL (ref 0.0–0.1)
Immature Granulocytes: 1 %
Lymphocytes Absolute: 2 10*3/uL (ref 0.7–3.1)
Lymphs: 22 %
MCH: 35.2 pg — ABNORMAL HIGH (ref 26.6–33.0)
MCHC: 36.1 g/dL — ABNORMAL HIGH (ref 31.5–35.7)
MCV: 98 fL — ABNORMAL HIGH (ref 79–97)
Monocytes Absolute: 1 10*3/uL — ABNORMAL HIGH (ref 0.1–0.9)
Monocytes: 11 %
Neutrophils Absolute: 5.7 10*3/uL (ref 1.4–7.0)
Neutrophils: 64 %
Platelets: 258 10*3/uL (ref 150–450)
RBC: 4.74 x10E6/uL (ref 4.14–5.80)
RDW: 12.3 % (ref 11.6–15.4)
WBC: 8.8 10*3/uL (ref 3.4–10.8)

## 2023-06-26 LAB — COMPREHENSIVE METABOLIC PANEL
ALT: 49 IU/L — ABNORMAL HIGH (ref 0–44)
AST: 68 IU/L — ABNORMAL HIGH (ref 0–40)
Albumin: 4.9 g/dL (ref 3.9–4.9)
Alkaline Phosphatase: 121 IU/L (ref 44–121)
BUN/Creatinine Ratio: 10 (ref 10–24)
BUN: 10 mg/dL (ref 8–27)
Bilirubin Total: 0.9 mg/dL (ref 0.0–1.2)
CO2: 20 mmol/L (ref 20–29)
Calcium: 10.4 mg/dL — ABNORMAL HIGH (ref 8.6–10.2)
Chloride: 96 mmol/L (ref 96–106)
Creatinine, Ser: 0.98 mg/dL (ref 0.76–1.27)
Globulin, Total: 3.1 g/dL (ref 1.5–4.5)
Glucose: 136 mg/dL — ABNORMAL HIGH (ref 70–99)
Potassium: 3.3 mmol/L — ABNORMAL LOW (ref 3.5–5.2)
Sodium: 139 mmol/L (ref 134–144)
Total Protein: 8 g/dL (ref 6.0–8.5)
eGFR: 87 mL/min/{1.73_m2} (ref >59)

## 2023-06-26 LAB — MICROALBUMIN / CREATININE URINE RATIO
Creatinine, Urine: 255.9 mg/dL
Microalb/Creat Ratio: 7 mg/g{creat} (ref 0–29)
Microalbumin, Urine: 17.5 ug/mL

## 2023-06-26 LAB — TSH: TSH: 1.43 u[IU]/mL (ref 0.450–4.500)

## 2023-06-26 LAB — HEMOGLOBIN A1C
Est. average glucose Bld gHb Est-mCnc: 157 mg/dL
Hgb A1c MFr Bld: 7.1 % — ABNORMAL HIGH (ref 4.8–5.6)

## 2023-06-26 LAB — LIPID PANEL
Chol/HDL Ratio: 3.4 ratio (ref 0.0–5.0)
Cholesterol, Total: 171 mg/dL (ref 100–199)
HDL: 51 mg/dL (ref >39)
LDL Chol Calc (NIH): 70 mg/dL (ref 0–99)
Triglycerides: 315 mg/dL — ABNORMAL HIGH (ref 0–149)
VLDL Cholesterol Cal: 50 mg/dL — ABNORMAL HIGH (ref 5–40)

## 2023-06-26 LAB — PSA: Prostate Specific Ag, Serum: 2.2 ng/mL (ref 0.0–4.0)

## 2023-06-26 MED ORDER — METFORMIN HCL 500 MG PO TABS: 500.0000 mg | ORAL_TABLET | Freq: Two times a day (BID) | ORAL | 1 refills | Status: DC

## 2023-07-14 ENCOUNTER — Encounter: Payer: Self-pay | Admitting: Cardiology

## 2023-07-14 ENCOUNTER — Ambulatory Visit: Payer: Medicare HMO | Attending: Cardiology | Admitting: Cardiology

## 2023-07-14 VITALS — BP 110/78 | HR 92 | Ht 68.0 in | Wt 203.0 lb

## 2023-07-14 DIAGNOSIS — I10 Essential (primary) hypertension: Secondary | ICD-10-CM

## 2023-07-14 DIAGNOSIS — I2510 Atherosclerotic heart disease of native coronary artery without angina pectoris: Secondary | ICD-10-CM

## 2023-07-14 DIAGNOSIS — E782 Mixed hyperlipidemia: Secondary | ICD-10-CM | POA: Diagnosis not present

## 2023-07-14 DIAGNOSIS — R0683 Snoring: Secondary | ICD-10-CM | POA: Diagnosis not present

## 2023-07-14 MED ORDER — METOPROLOL SUCCINATE ER 50 MG PO TB24: 50.0000 mg | ORAL_TABLET | Freq: Every day | ORAL | 3 refills | Status: AC

## 2023-07-14 NOTE — Patient Instructions (Signed)
 Medication Instructions:  Your physician recommends the following medication changes.  STOP TAKING: Amlodipine 5 mg  START TAKING: Metoprolol succinate (Toprol XL) 50 mg tablet once daily   *If you need a refill on your cardiac medications before your next appointment, please call your pharmacy*  Lab Work: No labs ordered today   Testing/Procedures: No test ordered today   Follow-Up: At [facility], you and your health needs are our priority.  As part of our continuing mission to provide you with exceptional heart care, our providers are all part of one team.  This team includes your primary Cardiologist (physician) and Advanced Practice Providers or APPs (Physician Assistants and Nurse Practitioners) who all work together to provide you with the care you need, when you need it.  Your next appointment:   3 month(s)  Provider:   You may see Debbe Odea, MD or one of the following Advanced Practice Providers on your designated Care Team:   Nicolasa Ducking, NP Ames Dura, PA-C Eula Listen, PA-C Cadence Kieler, PA-C Charlsie Quest, NP Carlos Levering, NP    We recommend signing up for the patient portal called "MyChart".  Sign up information is provided on this After Visit Summary.  MyChart is used to connect with patients for Virtual Visits (Telemedicine).  Patients are able to view lab/test results, encounter notes, upcoming appointments, etc.  Non-urgent messages can be sent to your provider as well.   To learn more about what you can do with MyChart, go to [email or web address].   Other Instructions Referral will be made to pulmonology for obstructive sleep apnea evaluation.

## 2023-07-14 NOTE — Progress Notes (Signed)
 Cardiology Office Note:    Date:  07/14/2023   ID:  Jorge Jordan, DOB 08-08-1960, MRN [id number]  PCP:  Jorge Cords, DO   [facility] Providers Cardiologist:  Jorge Odea, MD     Referring MD: Jorge Jordan *   No chief complaint on file.   History of Present Illness:    Jorge Jordan is a 63 y.o. male with a hx of CAD (LAD, LCx, RCA coronary calcifications), hypertension, hyperlipidemia, anxiety who presents for follow-up.  Previously seen due to dyspnea on exertion.  Echo echo and coronary CT was obtained to evaluate cardiac etiology.  Patient still has shortness of breath with exertion.  Endorses daytime fatigue.  Wife has told patient he snores.  Baseline heart rate is usually elevated, he felt somewhat better and less anxious after taking metoprolol prescan for CCTA.  Prior notes/testing screening calcium scan 02/19/2023 showing score of 672, 91st percentile.    Past Medical History:  Diagnosis Date   Allergy    Anxiety    Arthritis    Asthma    Cancer (HCC)    squamous cell   Depression    Foot deformity    foot arthrodesis L foot   Hyperlipidemia    Hypertension    Osteoporosis    PONV (postoperative nausea and vomiting)    Pre-diabetes    Shoulder bursitis    both    Tendinitis    elbow    Past Surgical History:  Procedure Laterality Date   ARTHOSCOPIC ROTAOR CUFF REPAIR Left 04/23/2022   Procedure: ARTHROSCOPIC ROTATOR CUFF REPAIR;  Surgeon: Jorge Herrlich, MD;  Location: [facility] ORS;  Service: Orthopedics;  Laterality: Left;   COLONOSCOPY N/A 11/22/2020   Procedure: COLONOSCOPY;  Surgeon: Jorge Spillers, MD;  Location: [facility] ENDOSCOPY;  Service: Endoscopy;  Laterality: N/A;   COLONOSCOPY WITH PROPOFOL N/A 03/13/2023   Procedure: COLONOSCOPY WITH PROPOFOL;  Surgeon: Jorge Reil, MD;  Location[facility] ENDOSCOPY;  Service: Gastroenterology;  Laterality: N/A;   FOOT SURGERY Left 2001   Triple arthrodesis    KNEE ARTHROSCOPY WITH LATERAL MENISECTOMY Right 05/01/2021   Procedure: KNEE ARTHROSCOPY WITH LATERAL MENISECTOMY;  Surgeon: Jorge Bucker, MD;  Location: [facility] ORS;  Service: Orthopedics;  Laterality: Right;   KNEE ARTHROSCOPY WITH MEDIAL MENISECTOMY Right 05/01/2021   Procedure: KNEE ARTHROSCOPY WITH MEDIAL MENISECTOMY;  Surgeon: Jorge Bucker, MD;  Location: [facility] ORS;  Service: Orthopedics;  Laterality: Right;   QUADRICEPS TENDON REPAIR Right 07/14/2020   Procedure: REPAIR QUADRICEP TENDON;  Surgeon: Jorge Bucker, MD;  Location: [facility] ORS;  Service: Orthopedics;  Laterality: Right;   QUADRICEPS TENDON REPAIR Right 05/01/2021   Procedure: REPAIR QUADRICEP TENDON;  Surgeon: Jorge Bucker, MD;  Location: [facility] ORS;  Service: Orthopedics;  Laterality: Right;   REPAIR QUADRICEPS/HAMSTRING MUSCLES Right 05/04/2020   Procedure: REPAIR QUADRICEPS/HAMSTRING MUSCLES;  Surgeon: Jorge Bucker, MD;  Location: [facility] ORS;  Service: Orthopedics;  Laterality: Right;  RIGHT QUADRICEPS   ROTATOR CUFF REPAIR Left 2009   SHOULDER ARTHROSCOPY Left 04/23/2022   Procedure: ARTHROSCOPY SHOULDER;  Surgeon: Jorge Herrlich, MD;  Location: [facility] ORS;  Service: Orthopedics;  Laterality: Left;   SHOULDER FUSION SURGERY Left 2009   WRIST SURGERY Right    ganglion cyst    Current Medications: Current Meds  Medication Sig   acetaminophen (TYLENOL) 500 MG tablet Take 1,000 mg by mouth every 6 (six) hours as needed for moderate pain.   aspirin EC 81 MG tablet Take 81 mg by  mouth daily.   atorvastatin (LIPITOR) 40 MG tablet Take 1 tablet (40 mg total) by mouth at bedtime.   busPIRone (BUSPAR) 10 MG tablet Take 1 tablet (10 mg total) by mouth 2 (two) times daily.   clonazePAM (KLONOPIN) 0.5 MG tablet Take 1 tablet (0.5 mg total) by mouth 2 (two) times daily as needed for anxiety.   colchicine 0.6 MG tablet Take 2 tablets = 1.2 mg at the first sign of gout flare, followed by 0.6 mg after 1 hour. Then every day after take 1 tablet  daily for up to 7-10 days until gout flare resolved   diphenhydrAMINE (BENADRYL) 25 MG tablet Take 25 mg by mouth every 6 (six) hours as needed.   fluoruracil (CARAC) 0.5 % cream Apply twice daily to forehead and temples for 4-5 days and scalp for 5-6 days   lisinopril-hydrochlorothiazide (ZESTORETIC) 20-25 MG tablet Take 1 tablet by mouth daily.   Melatonin 10 MG TABS Take by mouth daily. qhs   metFORMIN (GLUCOPHAGE) 500 MG tablet Take 1 tablet (500 mg total) by mouth 2 (two) times daily with a meal.   metoprolol succinate (TOPROL XL) 50 MG 24 hr tablet Take 1 tablet (50 mg total) by mouth daily.   sildenafil (REVATIO) 20 MG tablet Take 3-5 tablets as needed 30 prior to sex.   traZODone (DESYREL) 100 MG tablet Take 1 tablet (100 mg total) by mouth at bedtime. (Patient taking differently: Take 100 mg by mouth at bedtime as needed for sleep.)   vortioxetine HBr (TRINTELLIX) 10 MG TABS tablet Take 1 tablet (10 mg total) by mouth daily.   [DISCONTINUED] amLODipine (NORVASC) 5 MG tablet Take 1 tablet (5 mg total) by mouth daily.     Allergies:   Patient has no known allergies.   Social History   Socioeconomic History   Marital status: Married    Spouse name: Jorge Jordan   Number of children: 4   Years of education: Not on file   Highest education level: Not on file  Occupational History   Occupation: disability   Tobacco Use   Smoking status: Never   Smokeless tobacco: Never  Vaping Use   Vaping status: Never Used  Substance and Sexual Activity   Alcohol use: Yes    Comment: occasionally   Drug use: No   Sexual activity: Not on file  Other Topics Concern   Not on file  Social History Narrative   Not on file   Social Drivers of Health   Financial Resource Strain: Low Risk  (09/12/2022)   Overall Financial Resource Strain (CARDIA)    Difficulty of Paying Living Expenses: Not hard at all  Food Insecurity: No Food Insecurity (09/12/2022)   Hunger Vital Sign    Worried About Running Out  of Food in the Last Year: Never true    Ran Out of Food in the Last Year: Never true  Transportation Needs: No Transportation Needs (09/12/2022)   PRAPARE - Administrator, Civil Service (Medical): No    Lack of Transportation (Non-Medical): No  Physical Activity: Insufficiently Active (09/12/2022)   Exercise Vital Sign    Days of Exercise per Week: 2 days    Minutes of Exercise per Session: 20 min  Stress: No Stress Concern Present (09/12/2022)   [employer] of Occupational Health - Occupational Stress Questionnaire    Feeling of Stress : Not at all  Social Connections: Moderately Isolated (09/12/2022)   Social Connection and Isolation Panel [NHANES]  Frequency of Communication with Friends and Family: More than three times a week    Frequency of Social Gatherings with Friends and Family: Not on file    Attends Religious Services: Never    Database administrator or Organizations: No    Attends Engineer, structural: Never    Marital Status: Married     Family History: The patient's family history includes Cancer in his father; Depression in his mother; Diabetes in his maternal grandmother and mother; Heart attack in his maternal grandfather; Hyperlipidemia in his father and mother; Hypertension in his brother, father, and mother.  ROS:   Please see the history of present illness.     All other systems reviewed and are negative.  EKGs/Labs/Other Studies Reviewed:    The following studies were reviewed today:       Recent Labs: 06/25/2023: ALT 49; BUN 10; Creatinine, Ser 0.98; Hemoglobin 16.7; Platelets 258; Potassium 3.3; Sodium 139; TSH 1.430  Recent Lipid Panel    Component Value Date/Time   CHOL 171 06/25/2023 1336   TRIG 315 (H) 06/25/2023 1336   HDL 51 06/25/2023 1336   CHOLHDL 3.4 06/25/2023 1336   LDLCALC 70 06/25/2023 1336     Risk Assessment/Calculations:             Physical Exam:    VS:  BP 110/78   Pulse 92   Ht 5\' 8"  (1.727 m)    Wt 203 lb (92.1 kg)   SpO2 97%   BMI 30.87 kg/m     Wt Readings from Last 3 Encounters:  07/14/23 203 lb (92.1 kg)  06/25/23 203 lb (92.1 kg)  05/16/23 202 lb (91.6 kg)     GEN:  Well nourished, well developed in no acute distress HEENT: Normal NECK: No JVD; No carotid bruits CARDIAC: RRR, no murmurs, rubs, gallops RESPIRATORY:  Clear to auscultation without rales, wheezing or rhonchi  ABDOMEN: Soft, non-tender, non-distended MUSCULOSKELETAL:  No edema; No deformity  SKIN: Warm and dry NEUROLOGIC:  Alert and oriented x 3 PSYCHIATRIC:  Normal affect   ASSESSMENT:    1. Coronary artery disease involving native coronary artery of native heart, unspecified whether angina present   2. Snoring   3. Primary hypertension   4. Mixed hyperlipidemia    PLAN:    In order of problems listed above:  Mild nonobstructive CAD, 25-49% proximal LAD, minimal RCA, left circumflex.  Echo 2/25 EF 60 to 65%, impaired relaxation.  Continue aspirin, Lipitor.  No findings to suggest etiology of dyspnea. Snoring, daytime fatigue.  Refer to pulmonary medicine for OSA eval. Hypertension, BP controlled.  Baseline elevated heart rates.  Start Toprol-XL 50 mg daily, stop Norvasc.  Continue Zestoretic 20-25 mg daily. Hyperlipidemia, cholesterol controlled.  Lipitor 40 mg daily.  Follow-up in 3 months     Medication Adjustments/Labs and Tests Ordered: Current medicines are reviewed at length with the patient today.  Concerns regarding medicines are outlined above.  Orders Placed This Encounter  Procedures   Ambulatory referral to Pulmonology   Meds ordered this encounter  Medications   metoprolol succinate (TOPROL XL) 50 MG 24 hr tablet    Sig: Take 1 tablet (50 mg total) by mouth daily.    Dispense:  90 tablet    Refill:  3    Patient Instructions  Medication Instructions:  Your physician recommends the following medication changes.  STOP TAKING: Amlodipine 5 mg  START  TAKING: Metoprolol succinate (Toprol XL) 50 mg tablet once daily   *  If you need a refill on your cardiac medications before your next appointment, please call your pharmacy*  Lab Work: No labs ordered today   Testing/Procedures: No test ordered today   Follow-Up: At [facility], you and your health needs are our priority.  As part of our continuing mission to provide you with exceptional heart care, our providers are all part of one team.  This team includes your primary Cardiologist (physician) and Advanced Practice Providers or APPs (Physician Assistants and Nurse Practitioners) who all work together to provide you with the care you need, when you need it.  Your next appointment:   3 month(s)  Provider:   You may see Jorge Odea, MD or one of the following Advanced Practice Providers on your designated Care Team:   Nicolasa Ducking, NP Ames Dura, PA-C Eula Listen, PA-C Cadence [place], PA-C Charlsie Quest, NP Carlos Levering, NP    We recommend signing up for the patient portal called "MyChart".  Sign up information is provided on this After Visit Summary.  MyChart is used to connect with patients for Virtual Visits (Telemedicine).  Patients are able to view lab/test results, encounter notes, upcoming appointments, etc.  Non-urgent messages can be sent to your provider as well.   To learn more about what you can do with MyChart, go to [email or web address].   Other Instructions Referral will be made to pulmonology for obstructive sleep apnea evaluation.          Signed, Jorge Odea, MD  07/14/2023 4:56 PM    [facility]

## 2023-07-15 ENCOUNTER — Ambulatory Visit (INDEPENDENT_AMBULATORY_CARE_PROVIDER_SITE_OTHER): Admitting: Sleep Medicine

## 2023-07-15 ENCOUNTER — Encounter: Payer: Self-pay | Admitting: Sleep Medicine

## 2023-07-15 VITALS — BP 102/62 | HR 64 | Temp 97.7°F | Ht 68.0 in | Wt 203.6 lb

## 2023-07-15 DIAGNOSIS — E669 Obesity, unspecified: Secondary | ICD-10-CM

## 2023-07-15 DIAGNOSIS — E782 Mixed hyperlipidemia: Secondary | ICD-10-CM | POA: Diagnosis not present

## 2023-07-15 DIAGNOSIS — I10 Essential (primary) hypertension: Secondary | ICD-10-CM

## 2023-07-15 DIAGNOSIS — G4733 Obstructive sleep apnea (adult) (pediatric): Secondary | ICD-10-CM

## 2023-07-15 DIAGNOSIS — Z6830 Body mass index (BMI) 30.0-30.9, adult: Secondary | ICD-10-CM

## 2023-07-15 NOTE — Progress Notes (Signed)
 Name:Jorge Jordan MRN: [id number] DOB: Jul 25, 1960   CHIEF COMPLAINT:  SNORING   HISTORY OF PRESENT ILLNESS:  Mr. Jorge Jordan is a 63 y.o. w/ a h/o HTN, hyperlipidemia, anxiety, depression and obesity who presents for c/o loud snoring which has been present for several years. Reports nocturnal awakenings due to unclear reasons, however does not have difficulty falling back to sleep. Denies any significant weight changes. Denies morning headaches, RLS symptoms, dream enactment, cataplexy, hypnagogic or hypnapompic hallucinations. Reports a family history of sleep apnea. Denies drowsy driving. Drinks 6-7 glasses of tea daily, denies alcohol use, denies tobacco or illicit drug use.   Bedtime 12-1 am Sleep onset 30 mins Rise time 9-10 am   EPWORTH SLEEP SCORE 8     No data to display          PAST MEDICAL HISTORY :   has a past medical history of Allergy, Anxiety, Arthritis, Asthma, Cancer (HCC), Depression, Foot deformity, Hyperlipidemia, Hypertension, Osteoporosis, PONV (postoperative nausea and vomiting), Pre-diabetes, Shoulder bursitis, and Tendinitis.  has a past surgical history that includes Foot surgery (Left, 2001); Shoulder fusion surgery (Left, 2009); Wrist surgery (Right); Rotator cuff repair (Left, 2009); Repair quadriceps/hamstring muscles (Right, 05/04/2020); Quadriceps tendon repair (Right, 07/14/2020); Colonoscopy (N/A, 11/22/2020); Quadriceps tendon repair (Right, 05/01/2021); Knee arthroscopy with medial menisectomy (Right, 05/01/2021); Knee arthroscopy with lateral menisectomy (Right, 05/01/2021); Shoulder arthroscopy (Left, 04/23/2022); Arthroscopic rotator cuff repair (Left, 04/23/2022); and Colonoscopy with propofol (N/A, 03/13/2023). Prior to Admission medications   Medication Sig Start Date End Date Taking? Authorizing Provider  acetaminophen (TYLENOL) 500 MG tablet Take 1,000 mg by mouth every 6 (six) hours as needed for moderate pain.   Yes [provider]  aspirin EC 81 MG tablet Take 81 mg by mouth daily.   Yes [provider]  atorvastatin (LIPITOR) 40 MG tablet Take 1 tablet (40 mg total) by mouth at bedtime. 02/24/23  Yes Karamalegos, Netta Neat, DO  busPIRone (BUSPAR) 10 MG tablet Take 1 tablet (10 mg total) by mouth 2 (two) times daily. 02/14/23  Yes Karamalegos, Netta Neat, DO  diphenhydrAMINE (BENADRYL) 25 MG tablet Take 25 mg by mouth every 6 (six) hours as needed.   Yes [provider]  lisinopril-hydrochlorothiazide (ZESTORETIC) 20-25 MG tablet Take 1 tablet by mouth daily. 02/14/23  Yes Karamalegos, Netta Neat, DO  Melatonin 10 MG TABS Take by mouth daily. qhs   Yes [provider]  metFORMIN (GLUCOPHAGE) 500 MG tablet Take 1 tablet (500 mg total) by mouth 2 (two) times daily with a meal. 06/26/23  Yes Karamalegos, Netta Neat, DO  metoprolol succinate (TOPROL XL) 50 MG 24 hr tablet Take 1 tablet (50 mg total) by mouth daily. 07/14/23  Yes Debbe Odea, MD  sildenafil (REVATIO) 20 MG tablet Take 3-5 tablets as needed 30 prior to sex. 02/07/22  Yes Karamalegos, Netta Neat, DO  traZODone (DESYREL) 100 MG tablet Take 1 tablet (100 mg total) by mouth at bedtime. Patient taking differently: Take 100 mg by mouth at bedtime as needed for sleep. 11/08/20  Yes Karamalegos, Netta Neat, DO  vortioxetine HBr (TRINTELLIX) 10 MG TABS tablet Take 1 tablet (10 mg total) by mouth daily. 07/02/17  Yes Karamalegos, Netta Neat, DO  clonazePAM (KLONOPIN) 0.5 MG tablet Take 1 tablet (0.5 mg total) by mouth 2 (two) times daily as needed for anxiety. Patient not taking: Reported on 07/15/2023 02/14/23   Smitty Cords, DO  colchicine 0.6 MG tablet Take 2 tablets =  1.2 mg at the first sign of gout flare, followed by 0.6 mg after 1 hour. Then every day after take 1 tablet daily for up to 7-10 days until gout flare resolved Patient not taking: Reported on 07/15/2023 06/11/22   Smitty Cords, DO  fluoruracil [facility]) 0.5  % cream Apply twice daily to forehead and temples for 4-5 days and scalp for 5-6 days Patient not taking: Reported on 07/15/2023 07/20/21   [provider]  metoprolol tartrate (LOPRESSOR) 100 MG tablet Take 1 tablet (100 mg total) by mouth once for 1 dose. TWO HOURS PRIOR TO CARDIAC CT 05/16/23 05/16/23  Debbe Odea, MD   No Known Allergies  FAMILY HISTORY:  family history includes Cancer in his father; Depression in his mother; Diabetes in his maternal grandmother and mother; Heart attack in his maternal grandfather; Hyperlipidemia in his father and mother; Hypertension in his brother, father, and mother. SOCIAL HISTORY:  reports that he has never smoked. He has never used smokeless tobacco. He reports current alcohol use. He reports that he does not use drugs.   Review of Systems:  Gen:  Denies  fever, sweats, chills weight loss  HEENT: Denies blurred vision, double vision, ear pain, eye pain, hearing loss, nose bleeds, sore throat Cardiac:  No dizziness, chest pain or heaviness, chest tightness,edema, No JVD Resp:   No cough, -sputum production, -shortness of breath,-wheezing, -hemoptysis,  Gi: Denies swallowing difficulty, stomach pain, nausea or vomiting, diarrhea, constipation, bowel incontinence Gu:  Denies bladder incontinence, burning urine Ext:   Denies Joint pain, stiffness or swelling Skin: Denies  skin rash, easy bruising or bleeding or hives Endoc:  Denies polyuria, polydipsia , polyphagia or weight change Psych:   Denies depression, insomnia or hallucinations  Other:  All other systems negative  VITAL SIGNS: BP 102/62 (BP Location: Left Arm, Patient Position: Sitting, Cuff Size: Normal)   Pulse 64   Temp 97.7 F (36.5 C) (Temporal)   Ht 5\' 8"  (1.727 m)   Wt 203 lb 9.6 oz (92.4 kg)   SpO2 97%   BMI 30.96 kg/m     Physical Examination:   General Appearance: No distress  EYES PERRLA, EOM intact.   NECK Supple, No JVD Pulmonary: normal breath sounds, No  wheezing.  CardiovascularNormal S1,S2.  No m/r/g.   Abdomen: Benign, Soft, non-tender. Skin:   warm, no rashes, no ecchymosis  Extremities: normal, no cyanosis, clubbing. Neuro:without focal findings,  speech normal  PSYCHIATRIC: Mood, affect within normal limits.   ASSESSMENT AND PLAN  OSA I suspect that OSA is likely present due to clinical presentation. Discussed the consequences of untreated sleep apnea. Advised not to drive drowsy for safety of patient and others. Will complete further evaluation with a home sleep study and follow up to review results.    HTN Stable, on current management. Following with PCP.   Hyperlipidemia Stable, on current management.   Obesity Counseled patient on diet and lifestyle modification.    MEDICATION ADJUSTMENTS/LABS AND TESTS ORDERED: Recommend Sleep Study   Patient  satisfied with Plan of action and management. All questions answered  Follow up to review HST results and treatment plan.   I spent a total of 49 minutes reviewing chart data, face-to-face evaluation with the patient, counseling and coordination of care as detailed above.    Tempie Hoist, M.D.  Sleep Medicine [facility]

## 2023-07-15 NOTE — Patient Instructions (Signed)
 Marland Kitchen

## 2023-09-17 ENCOUNTER — Ambulatory Visit: Payer: Self-pay

## 2023-09-17 NOTE — Telephone Encounter (Signed)
 Copied from CRM [id number]. Topic: Clinical - Medication Question >> Sep 17, 2023  5:49 PM Minus Amel G wrote: Reason for CRM: patient is having trouble sleeping and would like to know if his provider can send in a prescription for traZODone  (DESYREL ) 100 MG tablet

## 2023-09-19 ENCOUNTER — Ambulatory Visit (INDEPENDENT_AMBULATORY_CARE_PROVIDER_SITE_OTHER): Payer: Medicare HMO

## 2023-09-19 DIAGNOSIS — Z0000 Encounter for general adult medical examination without abnormal findings: Secondary | ICD-10-CM | POA: Diagnosis not present

## 2023-09-19 NOTE — Patient Instructions (Addendum)
 Jorge Jordan , Thank you for taking time out of your busy schedule to complete your Annual Wellness Visit with me. I enjoyed our conversation and look forward to speaking with you again next year. I, as well as your care team,  appreciate your ongoing commitment to your health goals. Please review the following plan we discussed and let me know if I can assist you in the future.   Follow up Visits: Next Medicare AWV with our clinical staff:   10/01/24 @ 2:00 PM BY PHONE Have you seen your provider in the last 6 months (3 months if uncontrolled diabetes)? Yes  Clinician Recommendations:  Aim for 30 minutes of exercise or brisk walking, 6-8 glasses of water, and 5 servings of fruits and vegetables each day. TAKE CARE!      This is a list of the screening recommended for you and due dates:  Health Maintenance  Topic Date Due   Eye exam for diabetics  Never done   HIV Screening  Never done   Pneumococcal Vaccination (1 of 2 - PCV) Never done   DTaP/Tdap/Td vaccine (2 - Td or Tdap) 10/20/2022   COVID-19 Vaccine (6 - 2024-25 season) 02/16/2023   Flu Shot  11/07/2023   Hemoglobin A1C  12/26/2023   Yearly kidney function blood test for diabetes  06/24/2024   Yearly kidney health urinalysis for diabetes  06/24/2024   Complete foot exam   06/24/2024   Medicare Annual Wellness Visit  09/18/2024   Colon Cancer Screening  03/12/2033   Hepatitis C Screening  Completed   Zoster (Shingles) Vaccine  Completed   HPV Vaccine  Aged Out   Meningitis B Vaccine  Aged Out   Cologuard (Stool DNA test)  Discontinued    Advanced directives: (ACP Link)Information on Advanced Care Planning can be found at [place]  Secretary of [facility] Advance Health Care Directives Advance Health Care Directives. [email or web address]  Advance Care Planning is important because it:  [x]  Makes sure you receive the medical care that is consistent with your values, goals, and preferences  [x]  It provides guidance to your family and loved  ones and reduces their decisional burden about whether or not they are making the right decisions based on your wishes.  Follow the link provided in your after visit summary or read over the paperwork we have mailed to you to help you started getting your Advance Directives in place. If you need assistance in completing these, please reach out to us  so that we can help you!

## 2023-09-19 NOTE — Progress Notes (Signed)
 Subjective:   Jorge Jordan is a 63 y.o. who presents for a Medicare Wellness preventive visit.  As a reminder, Annual Wellness Visits don't include a physical exam, and some assessments may be limited, especially if this visit is performed virtually. We may recommend an in-person follow-up visit with your provider if needed.  Visit Complete: Virtual I connected with  Milderd Alken on 09/19/23 by a audio enabled telemedicine application and verified that I am speaking with the correct person using two identifiers.  Patient Location: Home  Provider Location: Office/Clinic  I discussed the limitations of evaluation and management by telemedicine. The patient expressed understanding and agreed to proceed.  Vital Signs: Because this visit was a virtual/telehealth visit, some criteria may be missing or patient reported. Any vitals not documented were not able to be obtained and vitals that have been documented are patient reported.  VideoDeclined- This patient declined Librarian, academic. Therefore the visit was completed with audio only.  Persons Participating in Visit: Patient.  AWV Questionnaire: No: Patient Medicare AWV questionnaire was not completed prior to this visit.  Cardiac Risk Factors include: advanced age (>4men, >67 women);diabetes mellitus;hypertension;male gender;dyslipidemia;sedentary lifestyle;obesity (BMI >30kg/m2)     Objective:    There were no vitals filed for this visit. There is no height or weight on file to calculate BMI.     09/19/2023    2:45 PM 03/13/2023    9:18 AM 09/12/2022    1:38 PM 04/23/2022    7:54 AM 04/15/2022    8:22 AM 05/01/2021   10:40 AM 04/18/2021    8:18 AM  Advanced Directives  Does Patient Have a Medical Advance Directive? No No No No No No No  Would patient like information on creating a medical advance directive? No - Patient declined  No - Patient declined No - Patient declined       Current  Medications (verified) Outpatient Encounter Medications as of 09/19/2023  Medication Sig   acetaminophen  (TYLENOL ) 500 MG tablet Take 1,000 mg by mouth every 6 (six) hours as needed for moderate pain.   aspirin EC 81 MG tablet Take 81 mg by mouth daily.   atorvastatin  (LIPITOR) 40 MG tablet Take 1 tablet (40 mg total) by mouth at bedtime.   busPIRone  (BUSPAR ) 10 MG tablet Take 1 tablet (10 mg total) by mouth 2 (two) times daily.   colchicine  0.6 MG tablet Take 2 tablets = 1.2 mg at the first sign of gout flare, followed by 0.6 mg after 1 hour. Then every day after take 1 tablet daily for up to 7-10 days until gout flare resolved   diphenhydrAMINE (BENADRYL) 25 MG tablet Take 25 mg by mouth every 6 (six) hours as needed.   fluoruracil (CARAC) 0.5 % cream Apply twice daily to forehead and temples for 4-5 days and scalp for 5-6 days   lisinopril -hydrochlorothiazide  (ZESTORETIC ) 20-25 MG tablet Take 1 tablet by mouth daily.   Melatonin 10 MG TABS Take by mouth daily. qhs   metFORMIN  (GLUCOPHAGE ) 500 MG tablet Take 1 tablet (500 mg total) by mouth 2 (two) times daily with a meal.   metoprolol  succinate (TOPROL  XL) 50 MG 24 hr tablet Take 1 tablet (50 mg total) by mouth daily.   metoprolol  tartrate (LOPRESSOR ) 100 MG tablet Take 1 tablet (100 mg total) by mouth once for 1 dose. TWO HOURS PRIOR TO CARDIAC CT   traZODone  (DESYREL ) 100 MG tablet Take 1 tablet (100 mg total) by mouth at  bedtime.   vortioxetine  HBr (TRINTELLIX ) 10 MG TABS tablet Take 1 tablet (10 mg total) by mouth daily.   clonazePAM  (KLONOPIN ) 0.5 MG tablet Take 1 tablet (0.5 mg total) by mouth 2 (two) times daily as needed for anxiety. (Patient not taking: Reported on 09/19/2023)   sildenafil  (REVATIO ) 20 MG tablet Take 3-5 tablets as needed 30 prior to sex. (Patient not taking: Reported on 09/19/2023)   No facility-administered encounter medications on file as of 09/19/2023.    Allergies (verified) Patient has no known allergies.    History: Past Medical History:  Diagnosis Date   Allergy    Anxiety    Arthritis    Asthma    Cancer (HCC)    squamous cell   Depression    Foot deformity    foot arthrodesis L foot   Hyperlipidemia    Hypertension    Osteoporosis    PONV (postoperative nausea and vomiting)    Pre-diabetes    Shoulder bursitis    both    Tendinitis    elbow   Past Surgical History:  Procedure Laterality Date   ARTHOSCOPIC ROTAOR CUFF REPAIR Left 04/23/2022   Procedure: ARTHROSCOPIC ROTATOR CUFF REPAIR;  Surgeon: Jerlyn Moons, MD;  Location: [facility] ORS;  Service: Orthopedics;  Laterality: Left;   COLONOSCOPY N/A 11/22/2020   Procedure: COLONOSCOPY;  Surgeon: Irby Mannan, MD;  Location: [facility] ENDOSCOPY;  Service: Endoscopy;  Laterality: N/A;   COLONOSCOPY WITH PROPOFOL  N/A 03/13/2023   Procedure: COLONOSCOPY WITH PROPOFOL ;  Surgeon: Selena Daily, MD;  Location: [facility];  Service: Gastroenterology;  Laterality: N/A;   FOOT SURGERY Left 2001   Triple arthrodesis   KNEE ARTHROSCOPY WITH LATERAL MENISECTOMY Right 05/01/2021   Procedure: KNEE ARTHROSCOPY WITH LATERAL MENISECTOMY;  Surgeon: Molli Angelucci, MD;  Location: [facility] ORS;  Service: Orthopedics;  Laterality: Right;   KNEE ARTHROSCOPY WITH MEDIAL MENISECTOMY Right 05/01/2021   Procedure: KNEE ARTHROSCOPY WITH MEDIAL MENISECTOMY;  Surgeon: Molli Angelucci, MD;  Location: [facility];  Service: Orthopedics;  Laterality: Right;   QUADRICEPS TENDON REPAIR Right 07/14/2020   Procedure: REPAIR QUADRICEP TENDON;  Surgeon: Molli Angelucci, MD;  Location: [facility];  Service: Orthopedics;  Laterality: Right;   QUADRICEPS TENDON REPAIR Right 05/01/2021   Procedure: REPAIR QUADRICEP TENDON;  Surgeon: Molli Angelucci, MD;  Location: [facility];  Service: Orthopedics;  Laterality: Right;   REPAIR QUADRICEPS/HAMSTRING MUSCLES Right 05/04/2020   Procedure: REPAIR QUADRICEPS/HAMSTRING MUSCLES;  Surgeon: Molli Angelucci, MD;  Location: [facility] OR[facility];   Service: Orthopedics;  Laterality: Right;  RIGHT QUADRICEPS   ROTATOR CUFF REPAIR Left 2009   SHOULDER ARTHROSCOPY Left 04/23/2022   Procedure: ARTHROSCOPY SHOULDER;  Surgeon: Jerlyn Moons, MD;  Location: [facility] OR[facility];  Service: Orthopedics;  Laterality: Left;   SHOULDER FUSION SURGERY Left 2009   WRIST SURGERY Right    ganglion cyst   Family History  Problem Relation Age of Onset   Hyperlipidemia Mother    Hypertension Mother    Diabetes Mother    Depression Mother    Hypertension Father    Hyperlipidemia Father    Cancer Father        skin cancer   Hypertension Brother    Diabetes Maternal Grandmother    Heart attack Maternal Grandfather    Social History   Socioeconomic History   Marital status: Married    Spouse name: Valinda Gault   Number of children: 4   Years of education: Not on file   Highest education level: Not on file  Occupational History   Occupation: disability   Tobacco Use   Smoking status: Former    Current packs/day: 0.00    Average packs/day: 0.3 packs/day for 13.0 years (3.3 ttl pk-yrs)    Types: Cigarettes    Start date: 04/08/1977    Quit date: 04/1990    Years since quitting: 33.4   Smokeless tobacco: Never  Vaping Use   Vaping status: Never Used  Substance and Sexual Activity   Alcohol use: Yes    Comment: occasionally   Drug use: No   Sexual activity: Not on file  Other Topics Concern   Not on file  Social History Narrative   Not on file   Social Drivers of Health   Financial Resource Strain: Low Risk  (09/19/2023)   Overall Financial Resource Strain (CARDIA)    Difficulty of Paying Living Expenses: Not hard at all  Food Insecurity: No Food Insecurity (09/19/2023)   Hunger Vital Sign    Worried About Running Out of Food in the Last Year: Never true    Ran Out of Food in the Last Year: Never true  Transportation Needs: No Transportation Needs (09/19/2023)   PRAPARE - Administrator, Civil Service (Medical): No    Lack of  Transportation (Non-Medical): No  Physical Activity: Insufficiently Active (09/19/2023)   Exercise Vital Sign    Days of Exercise per Week: 2 days    Minutes of Exercise per Session: 20 min  Stress: No Stress Concern Present (09/19/2023)   [employer] of Occupational Health - Occupational Stress Questionnaire    Feeling of Stress: Not at all  Social Connections: Moderately Isolated (09/19/2023)   Social Connection and Isolation Panel    Frequency of Communication with Friends and Family: More than three times a week    Frequency of Social Gatherings with Friends and Family: Never    Attends Religious Services: Never    Database administrator or Organizations: No    Attends Engineer, structural: Never    Marital Status: Married    Tobacco Counseling Counseling given: Not Answered    Clinical Intake:  Pre-visit preparation completed: Yes  Pain : No/denies pain     BMI - recorded: 30.9 Nutritional Status: BMI > 30  Obese Nutritional Risks: None Diabetes: Yes CBG done?: No Did pt. bring in CBG monitor from home?: No  Lab Results  Component Value Date   HGBA1C 7.1 (H) 06/25/2023   HGBA1C 6.9 (A) 06/25/2023   HGBA1C 6.9 (H) 02/18/2023     How often do you need to have someone help you when you read instructions, pamphlets, or other written materials from your doctor or pharmacy?: 1 - Never  Interpreter Needed?: No  Information entered by :: Dellie Fergusson, LPN   Activities of Daily Living    09/19/2023    2:47 PM 02/14/2023   11:00 AM  In your present state of health, do you have any difficulty performing the following activities:  Hearing? 0 0  Vision? 0 0  Difficulty concentrating or making decisions? 0 0  Walking or climbing stairs? 1 1  Comment LEG PAIN   Dressing or bathing? 0 0  Doing errands, shopping? 0 0  Preparing Food and eating ? N   Using the Toilet? N   In the past six months, have you accidently leaked urine? N   Do you have  problems with loss of bowel control? N   Managing your Medications? N   Managing your  Finances? N   Housekeeping or managing your Housekeeping? N     Patient Care Team: Raina Bunting, DO as PCP - General (Family Medicine) Constancia Delton, MD as PCP - Cardiology (Cardiology) Veto Gowda, FNP (Rheumatology) Elie Grove, LCSW as [facility] Care Management (Licensed Clinical Social Worker)  I have updated your Care Teams any recent Medical Services you may have received from other providers in the past year.     Assessment:   This is a routine wellness examination for Jaimeson.  Hearing/Vision screen Hearing Screening - Comments:: NO AIDS Vision Screening - Comments:: WEARS GLASSES MOST OF TIME- [name] [employer]   Goals Addressed             This Visit's Progress    DIET - INCREASE WATER INTAKE         Depression Screen     09/19/2023    2:44 PM 06/25/2023   10:42 AM 02/14/2023   11:00 AM 09/12/2022    1:36 PM 06/11/2022    3:41 PM 02/07/2022    9:02 AM 09/06/2021    1:31 PM  PHQ 2/9 Scores  PHQ - 2 Score 1 2 3 3 2 3  0  PHQ- 9 Score 1 7 10 7 8 8      Fall Risk     09/19/2023    2:47 PM 06/25/2023   10:42 AM 02/14/2023   11:01 AM 09/12/2022    1:39 PM 09/09/2022   12:51 PM  Fall Risk   Falls in the past year? 0 0 0 1 0  Number falls in past yr: 0   0 0  Injury with Fall? 0  1 1 0  Risk for fall due to : No Fall Risks   History of fall(s)   Follow up Falls evaluation completed   Falls prevention discussed;Falls evaluation completed     MEDICARE RISK AT HOME:  Medicare Risk at Home Any stairs in or around the home?: Yes If so, are there any without handrails?: Yes Home free of loose throw rugs in walkways, pet beds, electrical cords, etc?: Yes Adequate lighting in your home to reduce risk of falls?: Yes Life alert?: No Use of a cane, walker or w/c?: No Grab bars in the bathroom?: No Shower chair or bench in shower?: Yes Elevated  toilet seat or a handicapped toilet?: No  TIMED UP AND GO:  Was the test performed?  No  Cognitive Function: 6CIT completed        09/19/2023    2:49 PM 09/12/2022    1:45 PM 09/06/2021    1:32 PM 09/05/2020    2:05 PM  6CIT Screen  What Year? 0 points 0 points 0 points 0 points  What month? 0 points 0 points 0 points 0 points  What time? 0 points 0 points 0 points 0 points  Count back from 20 0 points 0 points 0 points 0 points  Months in reverse 0 points 0 points 0 points 0 points  Repeat phrase 0 points 0 points 0 points 0 points  Total Score 0 points 0 points 0 points 0 points    Immunizations Immunization History  Administered Date(s) Administered   Influenza, Quadrivalent, Recombinant, Inj, Pf 12/16/2021, 12/22/2022   Influenza,inj,Quad PF,6+ Mos 02/05/2017, 01/26/2018   Influenza-Unspecified 02/05/2017, 01/26/2018, 02/08/2019   Moderna SARS-COV2 Booster Vaccination 01/15/2022, 12/22/2022   PFIZER Comirnaty(Gray Top)Covid-19 Tri-Sucrose Vaccine 07/01/2019, 07/22/2019   PFIZER(Purple Top)SARS-COV-2 Vaccination 07/01/2019, 07/22/2019, 08/25/2020   Respiratory Syncytial Virus Vaccine,Recomb Aduvanted(Arexvy)  01/15/2022   Tdap 10/19/2012   Zoster Recombinant(Shingrix) 01/03/2020, 05/19/2020    Screening Tests Health Maintenance  Topic Date Due   OPHTHALMOLOGY EXAM  Never done   HIV Screening  Never done   Pneumococcal Vaccine 57-65 Years old (1 of 2 - PCV) Never done   DTaP/Tdap/Td (2 - Td or Tdap) 10/20/2022   COVID-19 Vaccine (6 - 2024-25 season) 02/16/2023   INFLUENZA VACCINE  11/07/2023   HEMOGLOBIN A1C  12/26/2023   Diabetic kidney evaluation - eGFR measurement  06/24/2024   Diabetic kidney evaluation - Urine ACR  06/24/2024   FOOT EXAM  06/24/2024   Medicare Annual Wellness (AWV)  09/18/2024   Colonoscopy  03/12/2033   Hepatitis C Screening  Completed   Zoster Vaccines- Shingrix  Completed   HPV VACCINES  Aged Out   Meningococcal B Vaccine  Aged Out   Fecal  DNA (Cologuard)  Discontinued    Health Maintenance  Health Maintenance Due  Topic Date Due   OPHTHALMOLOGY EXAM  Never done   HIV Screening  Never done   Pneumococcal Vaccine 32-63 Years old (1 of 2 - PCV) Never done   DTaP/Tdap/Td (2 - Td or Tdap) 10/20/2022   COVID-19 Vaccine (6 - 2024-25 season) 02/16/2023   Health Maintenance Items Addressed: UP TO DATE W/ COLONOSCOPY; SHOTS UP TO DATE EXCEPT PNA  Additional Screening:  Vision Screening: Recommended annual ophthalmology exams for early detection of glaucoma and other disorders of the eye. Would you like a referral to an eye doctor? No    Dental Screening: Recommended annual dental exams for proper oral hygiene  Community Resource Referral / Chronic Care Management: CRR required this visit?  No   CCM required this visit?  No   Plan:    I have personally reviewed and noted the following in the patient's chart:   Medical and social history Use of alcohol, tobacco or illicit drugs  Current medications and supplements including opioid prescriptions. Patient is not currently taking opioid prescriptions. Functional ability and status Nutritional status Physical activity Advanced directives List of other physicians Hospitalizations, surgeries, and ER visits in previous 12 months Vitals Screenings to include cognitive, depression, and falls Referrals and appointments  In addition, I have reviewed and discussed with patient certain preventive protocols, quality metrics, and best practice recommendations. A written personalized care plan for preventive services as well as general preventive health recommendations were provided to patient.   Pinky Bright, LPN   [id number]   After Visit Summary: (MyChart) Due to this being a telephonic visit, the after visit summary with patients personalized plan was offered to patient via MyChart   Notes: Nothing significant to report at this time.

## 2023-10-01 ENCOUNTER — Other Ambulatory Visit: Payer: Self-pay | Admitting: Family Medicine

## 2023-10-01 ENCOUNTER — Ambulatory Visit: Admitting: Family Medicine

## 2023-10-01 ENCOUNTER — Ambulatory Visit (INDEPENDENT_AMBULATORY_CARE_PROVIDER_SITE_OTHER): Admitting: Family Medicine

## 2023-10-01 VITALS — BP 124/72 | HR 72 | Ht 68.0 in | Wt 205.4 lb

## 2023-10-01 DIAGNOSIS — F4310 Post-traumatic stress disorder, unspecified: Secondary | ICD-10-CM

## 2023-10-01 DIAGNOSIS — R972 Elevated prostate specific antigen [PSA]: Secondary | ICD-10-CM

## 2023-10-01 DIAGNOSIS — F339 Major depressive disorder, recurrent, unspecified: Secondary | ICD-10-CM | POA: Diagnosis not present

## 2023-10-01 DIAGNOSIS — E782 Mixed hyperlipidemia: Secondary | ICD-10-CM

## 2023-10-01 DIAGNOSIS — Z7985 Long-term (current) use of injectable non-insulin antidiabetic drugs: Secondary | ICD-10-CM

## 2023-10-01 DIAGNOSIS — E1169 Type 2 diabetes mellitus with other specified complication: Secondary | ICD-10-CM

## 2023-10-01 DIAGNOSIS — I10 Essential (primary) hypertension: Secondary | ICD-10-CM

## 2023-10-01 DIAGNOSIS — Z0000 Encounter for general adult medical examination without abnormal findings: Secondary | ICD-10-CM

## 2023-10-01 LAB — POCT GLYCOSYLATED HEMOGLOBIN (HGB A1C): Hemoglobin A1C: 6 % — AB (ref 4.0–5.6)

## 2023-10-01 MED ORDER — TRAZODONE HCL 100 MG PO TABS: 100.0000 mg | ORAL_TABLET | Freq: Every day | ORAL | 3 refills | Status: AC

## 2023-10-01 MED ORDER — OZEMPIC (0.25 OR 0.5 MG/DOSE) 2 MG/3ML ~~LOC~~ SOPN: 0.5000 mg | PEN_INJECTOR | SUBCUTANEOUS | Status: DC

## 2023-10-01 NOTE — Progress Notes (Signed)
 Subjective:    Patient ID: Jorge Jordan Counter, male    DOB: [birth year], 63 y.o.   MRN: [id number]  Jorge Jordan is a 63 y.o. male presenting on 10/01/2023 for Diabetes   HPI  Discussed the use of AI scribe software for clinical note transcription with the patient, who gave verbal consent to proceed.  History of Present Illness   Jorge Jordan is a 63 year old male with diabetes who presents for a follow-up visit.  Type 2 Diabetes Hyperglycemia and diabetes management - Recently diagnosed with diabetes mellitus - Consistently taking metformin  as prescribed - Last hemoglobin A1c was 7.1%; current A1c is 6.0% after course of metformin  IR 500mg  BID - Urine screening in March showed no evidence of diabetic nephropathy - Planning to schedule a diabetic eye exam [facility] after July 4th due to insurance scheduling limitations  CHRONIC HTN: / Tachycardia Cardiac arrhythmia and blood pressure control - History of tachycardia under cardiology care - Recent heart scan with contrast showed normal findings - Currently taking metoprolol  50 mg daily, resulting in pulse rate of 72 bpm - Home blood pressure readings consistently around 124/72 mmHg Cardiology DC'd Amlodipine  Current Meds - Lisinopril -HCTZ 20-25mg  daily, Metoprolol  XL 50mg  daily Reports good compliance, took meds today. Tolerating well, w/o complaints.  Insomnia - Difficulty initiating sleep due to an active mind at night - Requires trazodone  100 mg nightly for sleep initiation - Requests refill of trazodone   Anxiety symptoms - Discontinued clonazepam  several years ago - Currently taking buspirone  for anxiety management      Elevated PSA He is also being monitored for elevated PSA levels. His PSA has increased to 3.0 from previous readings of 1.5 and 1.2. He has no urinary symptoms to report.   HYPERLIPIDEMIA: History fatty liver - Currently taking Pravastatin  40mg , tolerating well without side effects  or myalgias   Hepatic Steatosis Elevated LFTs previously   Major Depression, chronic recurrent / Anxiety + PTSD Improved on therapy Continues on Trintellix  10mg  daily, Trazodone  100mg  nightly AS NEEDED for insomnia using about 1x weekly, Buspirone  10mg  TWICE A DAY regularly but less relief. He did better on Clonazepam  in the past when he had more severe symptoms. Trintellix  rx authorized on his manufacturer discount next due later this year.   Health Maintenance:  Last Colonoscopy done 03/13/23, repeat 10 years. 2034     10/01/2023    2:09 PM 09/19/2023    2:44 PM 06/25/2023   10:42 AM  Depression screen PHQ 2/9  Decreased Interest 1 0 1  Down, Depressed, Hopeless 1 1 1   PHQ - 2 Score 2 1 2   Altered sleeping 2 0 1  Tired, decreased energy 2 0 1  Change in appetite 1 0 1  Feeling bad or failure about yourself  1 0 1  Trouble concentrating 0 0 1  Moving slowly or fidgety/restless 0 0 0  Suicidal thoughts 0 0 0  PHQ-9 Score 8 1 7   Difficult doing work/chores Somewhat difficult Not difficult at all Somewhat difficult       10/01/2023    2:10 PM 06/25/2023   10:42 AM 02/14/2023   11:00 AM 06/11/2022    3:40 PM  GAD 7 : Generalized Anxiety Score  Nervous, Anxious, on Edge 1 1 1 1   Control/stop worrying 1 1 2 1   Worry too much - different things 1 2 2 2   Trouble relaxing 1 2 2 1   Restless 1 1 0 2  Easily annoyed  or irritable 1 1 1  0  Afraid - awful might happen 1 1 1  0  Total GAD 7 Score 7 9 9 7   Anxiety Difficulty Somewhat difficult Somewhat difficult  Somewhat difficult     Past Medical History:  Diagnosis Date   Allergy    Anxiety    Arthritis    Asthma    Cancer (HCC)    squamous cell   Depression    Foot deformity    foot arthrodesis L foot   Hyperlipidemia    Hypertension    Osteoporosis    PONV (postoperative nausea and vomiting)    Pre-diabetes    Shoulder bursitis    both    Tendinitis    elbow   Past Surgical History:  Procedure Laterality Date    ARTHOSCOPIC ROTAOR CUFF REPAIR Left 04/23/2022   Procedure: ARTHROSCOPIC ROTATOR CUFF REPAIR;  Surgeon: Leora Lynwood SAUNDERS, MD;  Location: [facility];  Service: Orthopedics;  Laterality: Left;   COLONOSCOPY N/A 11/22/2020   Procedure: COLONOSCOPY;  Surgeon: Janalyn Keene NOVAK, MD;  Location: [facility];  Service: Endoscopy;  Laterality: N/A;   COLONOSCOPY WITH PROPOFOL  N/A 03/13/2023   Procedure: COLONOSCOPY WITH PROPOFOL ;  Surgeon: Unk Corinn Skiff, MD;  Location: [facility];  Service: Gastroenterology;  Laterality: N/A;   FOOT SURGERY Left 2001   Triple arthrodesis   KNEE ARTHROSCOPY WITH LATERAL MENISECTOMY Right 05/01/2021   Procedure: KNEE ARTHROSCOPY WITH LATERAL MENISECTOMY;  Surgeon: Kathlynn Sharper, MD;  Location: [facility];  Service: Orthopedics;  Laterality: Right;   KNEE ARTHROSCOPY WITH MEDIAL MENISECTOMY Right 05/01/2021   Procedure: KNEE ARTHROSCOPY WITH MEDIAL MENISECTOMY;  Surgeon: Kathlynn Sharper, MD;  Location: [facility];  Service: Orthopedics;  Laterality: Right;   QUADRICEPS TENDON REPAIR Right 07/14/2020   Procedure: REPAIR QUADRICEP TENDON;  Surgeon: Kathlynn Sharper, MD;  Location: [facility];  Service: Orthopedics;  Laterality: Right;   QUADRICEPS TENDON REPAIR Right 05/01/2021   Procedure: REPAIR QUADRICEP TENDON;  Surgeon: Kathlynn Sharper, MD;  Location: [facility];  Service: Orthopedics;  Laterality: Right;   REPAIR QUADRICEPS/HAMSTRING MUSCLES Right 05/04/2020   Procedure: REPAIR QUADRICEPS/HAMSTRING MUSCLES;  Surgeon: Kathlynn Sharper, MD;  Location: [facility];  Service: Orthopedics;  Laterality: Right;  RIGHT QUADRICEPS   ROTATOR CUFF REPAIR Left 2009   SHOULDER ARTHROSCOPY Left 04/23/2022   Procedure: ARTHROSCOPY SHOULDER;  Surgeon: Leora Lynwood SAUNDERS, MD;  Location: [facility];  Service: Orthopedics;  Laterality: Left;   SHOULDER FUSION SURGERY Left 2009   WRIST SURGERY Right    ganglion cyst   Social History   Socioeconomic History   Marital status: Married    Spouse  name: [name]   Number of children: 4   Years of education: Not on file   Highest education level: Associate degree: academic program  Occupational History   Occupation: disability   Tobacco Use   Smoking status: Former    Current packs/day: 0.00    Average packs/day: 0.3 packs/day for 13.0 years (3.3 ttl pk-yrs)    Types: Cigarettes    Start date: 04/08/1977    Quit date: 04/1990    Years since quitting: 33.5   Smokeless tobacco: Never  Vaping Use   Vaping status: Never Used  Substance and Sexual Activity   Alcohol use: Yes    Comment: occasionally   Drug use: No   Sexual activity: Not on file  Other Topics Concern   Not on file  Social History Narrative   Not on file   Social Drivers of Health  Financial Resource Strain: Low Risk  (10/01/2023)   Overall Financial Resource Strain (CARDIA)    Difficulty of Paying Living Expenses: Not very hard  Food Insecurity: No Food Insecurity (10/01/2023)   Hunger Vital Sign    Worried About Running Out of Food in the Last Year: Never true    Ran Out of Food in the Last Year: Never true  Transportation Needs: No Transportation Needs (10/01/2023)   PRAPARE - Administrator, Civil Service (Medical): No    Lack of Transportation (Non-Medical): No  Physical Activity: Insufficiently Active (10/01/2023)   Exercise Vital Sign    Days of Exercise per Week: 3 days    Minutes of Exercise per Session: 20 min  Stress: Stress Concern Present (10/01/2023)   [employer] of Occupational Health - Occupational Stress Questionnaire    Feeling of Stress: Rather much  Social Connections: Moderately Isolated (10/01/2023)   Social Connection and Isolation Panel    Frequency of Communication with Friends and Family: Twice a week    Frequency of Social Gatherings with Friends and Family: Once a week    Attends Religious Services: Patient declined    Database administrator or Organizations: No    Attends Engineer, structural: Not on  file    Marital Status: Married  Catering manager Violence: Not At Risk (09/19/2023)   Humiliation, Afraid, Rape, and Kick questionnaire    Fear of Current or Ex-Partner: No    Emotionally Abused: No    Physically Abused: No    Sexually Abused: No   Family History  Problem Relation Age of Onset   Hyperlipidemia Mother    Hypertension Mother    Diabetes Mother    Depression Mother    Hypertension Father    Hyperlipidemia Father    Cancer Father        skin cancer   Hypertension Brother    Diabetes Maternal Grandmother    Heart attack Maternal Grandfather    Current Outpatient Medications on File Prior to Visit  Medication Sig   acetaminophen  (TYLENOL ) 500 MG tablet Take 1,000 mg by mouth every 6 (six) hours as needed for moderate pain.   aspirin EC 81 MG tablet Take 81 mg by mouth daily.   atorvastatin  (LIPITOR) 40 MG tablet Take 1 tablet (40 mg total) by mouth at bedtime.   busPIRone  (BUSPAR ) 10 MG tablet Take 1 tablet (10 mg total) by mouth 2 (two) times daily.   colchicine  0.6 MG tablet Take 2 tablets = 1.2 mg at the first sign of gout flare, followed by 0.6 mg after 1 hour. Then every day after take 1 tablet daily for up to 7-10 days until gout flare resolved   diphenhydrAMINE (BENADRYL) 25 MG tablet Take 25 mg by mouth every 6 (six) hours as needed.   lisinopril -hydrochlorothiazide  (ZESTORETIC ) 20-25 MG tablet Take 1 tablet by mouth daily.   Melatonin 10 MG TABS Take by mouth daily. qhs   metFORMIN  (GLUCOPHAGE ) 500 MG tablet Take 1 tablet (500 mg total) by mouth 2 (two) times daily with a meal.   metoprolol  succinate (TOPROL  XL) 50 MG 24 hr tablet Take 1 tablet (50 mg total) by mouth daily.   vortioxetine  HBr (TRINTELLIX ) 10 MG TABS tablet Take 1 tablet (10 mg total) by mouth daily.   fluoruracil (CARAC) 0.5 % cream Apply twice daily to forehead and temples for 4-5 days and scalp for 5-6 days (Patient not taking: Reported on 10/01/2023)   sildenafil  (REVATIO ) 20  MG tablet Take  3-5 tablets as needed 30 prior to sex. (Patient not taking: Reported on 10/01/2023)   No current facility-administered medications on file prior to visit.    Review of Systems Per HPI unless specifically indicated above     Objective:    BP 124/72 (BP Location: Left Arm, Cuff Size: Normal)   Pulse 72   Ht 5' 8 (1.727 m)   Wt 205 lb 6.4 oz (93.2 kg)   SpO2 95%   BMI 31.23 kg/m   Wt Readings from Last 3 Encounters:  10/01/23 205 lb 6.4 oz (93.2 kg)  07/15/23 203 lb 9.6 oz (92.4 kg)  07/14/23 203 lb (92.1 kg)    Physical Exam Vitals and nursing note reviewed.  Constitutional:      General: He is not in acute distress.    Appearance: Normal appearance. He is well-developed. He is not diaphoretic.     Comments: Well-appearing, comfortable, cooperative  HENT:     Head: Normocephalic and atraumatic.   Eyes:     General:        Right eye: No discharge.        Left eye: No discharge.     Conjunctiva/sclera: Conjunctivae normal.    Cardiovascular:     Rate and Rhythm: Normal rate.  Pulmonary:     Effort: Pulmonary effort is normal.   Skin:    General: Skin is warm and dry.     Findings: No erythema or rash.   Neurological:     Mental Status: He is alert and oriented to person, place, and time.   Psychiatric:        Mood and Affect: Mood normal.        Behavior: Behavior normal.        Thought Content: Thought content normal.     Comments: Well groomed, good eye contact, normal speech and thoughts     Results for orders placed or performed in visit on 10/01/23  POCT glycosylated hemoglobin (Hb A1C)   Collection Time: 10/01/23  2:34 PM  Result Value Ref Range   Hemoglobin A1C 6.0 (A) 4.0 - 5.6 %   HbA1c POC (<> result, manual entry)     HbA1c, POC (prediabetic range)     HbA1c, POC (controlled diabetic range)        Assessment & Plan:   Problem List Items Addressed This Visit     Major depressive disorder, recurrent episode with anxious distress (HCC)    Relevant Medications   traZODone  (DESYREL ) 100 MG tablet   PTSD (post-traumatic stress disorder)   Relevant Medications   traZODone  (DESYREL ) 100 MG tablet   Type 2 diabetes mellitus with other specified complication (HCC) - Primary   Relevant Medications   OZEMPIC, 0.25 OR 0.5 MG/DOSE, 2 MG/3ML SOPN   Other Relevant Orders   POCT glycosylated hemoglobin (Hb A1C) (Completed)   Other Visit Diagnoses       Long-term current use of injectable noninsulin antidiabetic medication            Updated Health Maintenance information Encouraged improvement to lifestyle with diet and exercise Goal of weight loss   Type 2 Diabetes Mellitus Recent diagnosis A1c improved to 6.0% with metformin . Discussed transition to Ozempic for enhanced glycemic control and cardiovascular protection. Explained potential side effects and insurance coverage. Agreed to trial Ozempic with a six-week sample. - Administer finger prick for blood glucose measurement. - Provide six-week sample of Ozempic for trial. - Continue metformin  during initial Ozempic  trial 1st month low dose 0.25, then may DC or reduce at 0.5 dose - Contact office after 3-4 weeks of Ozempic trial to discuss prescription transition. - Plan to phase off metformin  once stable on Ozempic.  Hypertension Blood pressure controlled at home with metoprolol  and lisinopril /hydrochlorothiazide . - Continue metoprolol  XL 50 mg daily. - Continue lisinopril /hydrochlorothiazide  as prescribed. - Monitor blood pressure at home regularly.  Tachycardia Tachycardia managed with metoprolol , heart rate controlled. - Continue metoprolol  50 mg daily.  Insomnia Chronic insomnia managed with trazodone , reports benefit. - Refill trazodone  100 mg for 90 days with refills.  General Health Maintenance Discussed regular diabetic screenings. March urine screening normal. Plans for diabetic eye exam after July 4th. - Schedule diabetic eye exam after July 4th and send  results to office.  Follow-up Next physical scheduled for November, current visit for diabetes management follow-up. - Schedule follow-up appointment in early November for annual physical and blood panel.        Orders Placed This Encounter  Procedures   POCT glycosylated hemoglobin (Hb A1C)    Meds ordered this encounter  Medications   traZODone  (DESYREL ) 100 MG tablet    Sig: Take 1 tablet (100 mg total) by mouth at bedtime.    Dispense:  90 tablet    Refill:  3   OZEMPIC, 0.25 OR 0.5 MG/DOSE, 2 MG/3ML SOPN    Sig: Inject 0.5 mg into the skin once a week. Start with 0.25mg  weekly x 4 weeks then increase to 0.5mg  weekly injection.    84 day supply     Follow up plan: Return in about 4 months (around 01/31/2024) for 4-5 months fasting lab only then 1 week later Annual Physical.  [facility] orders printed and given to patient today  Marsa Officer, DO [facility] 10/01/2023, 2:21 PM

## 2023-10-01 NOTE — Patient Instructions (Addendum)
 Thank you for coming to the office today.  Recent Labs    06/25/23 1045 06/25/23 1336 10/01/23 1434  HGBA1C 6.9* 7.1* 6.0*   New sample Ozempic  Ozempic (Semaglutide injection) - start 0.25mg  weekly for 4 weeks then increase to 0.5mg  weekly  Call or message me after 3-4 weeks and let me know status of Ozempic and if you are ready to proceed w/ new rx order through the insurance.  New order starts at 0.5mg  weekly. One pen per month. Eventually can adjust dose.  Once you are steady on the 0.5mg  weekly, you can reduce and or stop Metformin .  Please schedule a Follow-up Appointment to: Return in about 4 months (around 01/31/2024) for 4-5 months fasting lab only then 1 week later Annual Physical.  If you have any other questions or concerns, please feel free to call the office or send a message through MyChart. You may also schedule an earlier appointment if necessary.  Additionally, you may be receiving a survey about your experience at our office within a few days to 1 week by e-mail or mail. We value your feedback.  Marsa Officer, DO [facility], [place]

## 2023-10-13 ENCOUNTER — Ambulatory Visit: Admitting: Cardiology

## 2023-10-13 NOTE — Progress Notes (Deleted)
 Cardiology Office Note    Date:  10/13/2023   ID:  Jorge Jordan, DOB [birth year], MRN [id number]  PCP:  Jorge Marsa PARAS, DO  Cardiologist:  Jorge Cave, MD  Electrophysiologist:  None   Chief Complaint: Follow up  History of Present Illness:   Jorge Jordan is a 63 y.o. male with history of nonobstructive CAD by coronary CTA in 05/2023, DM2, HTN, HLD, hepatic steatosis, obesity, and anxiety who presents for follow-up of CAD.  Calcium  score in 02/2023 of 672 which was the 94th percentile.  He was seen in the office in 05/2023 and noted exertional dyspnea , particularly in the summer months.  He underwent coronary CTA in 05/2023 that showed a calcium  score of 887 which was the 94th percentile.  There was 25 to 49% proximal to mid LAD stenosis and less than 25% RCA stenosis.  Noncardiac overread notable for aortic atherosclerosis and hepatic steatosis.  Echo in 05/2023 showed an EF of 60 to 65%, no regional wall motion abnormalities, grade 1 diastolic dysfunction, normal RV systolic function and ventricular cavity size, and an estimated right atrial pressure of 3 mmHg.  He was last seen in the office in 05/2023 and started on Toprol -XL with discontinuation of amlodipine  as patient reported he felt better when taking metoprolol  for coronary CTA.  He was also referred to pulmonary for sleep apnea evaluation.  ***   Labs independently reviewed: 09/2023 - A1c 6.0 06/2023 - Hgb 16.7, PLT 258, BUN 10, serum creatinine 0.98, potassium 3.3, albumin 4.9, AST 68, ALT 49, TSH normal, TC 171, TG 315, HDL 51, LDL 70  Past Medical History:  Diagnosis Date   Allergy    Anxiety    Arthritis    Asthma    Cancer (HCC)    squamous cell   Depression    Foot deformity    foot arthrodesis L foot   Hyperlipidemia    Hypertension    Osteoporosis    PONV (postoperative nausea and vomiting)    Pre-diabetes    Shoulder bursitis    both    Tendinitis    elbow    Past Surgical History:   Procedure Laterality Date   ARTHOSCOPIC ROTAOR CUFF REPAIR Left 04/23/2022   Procedure: ARTHROSCOPIC ROTATOR CUFF REPAIR;  Surgeon: Jorge Lynwood SAUNDERS, MD;  Location: [facility];  Service: Orthopedics;  Laterality: Left;   COLONOSCOPY N/A 11/22/2020   Procedure: COLONOSCOPY;  Surgeon: Jorge Keene NOVAK, MD;  Location: [facility];  Service: Endoscopy;  Laterality: N/A;   COLONOSCOPY WITH PROPOFOL  N/A 03/13/2023   Procedure: COLONOSCOPY WITH PROPOFOL ;  Surgeon: Jorge Corinn Skiff, MD;  Location: [facility];  Service: Gastroenterology;  Laterality: N/A;   FOOT SURGERY Left 2001   Triple arthrodesis   KNEE ARTHROSCOPY WITH LATERAL MENISECTOMY Right 05/01/2021   Procedure: KNEE ARTHROSCOPY WITH LATERAL MENISECTOMY;  Surgeon: Jorge Sharper, MD;  Location: [facility];  Service: Orthopedics;  Laterality: Right;   KNEE ARTHROSCOPY WITH MEDIAL MENISECTOMY Right 05/01/2021   Procedure: KNEE ARTHROSCOPY WITH MEDIAL MENISECTOMY;  Surgeon: Jorge Sharper, MD;  Location: [facility];  Service: Orthopedics;  Laterality: Right;   QUADRICEPS TENDON REPAIR Right 07/14/2020   Procedure: REPAIR QUADRICEP TENDON;  Surgeon: Jorge Sharper, MD;  Location: [facility];  Service: Orthopedics;  Laterality: Right;   QUADRICEPS TENDON REPAIR Right 05/01/2021   Procedure: REPAIR QUADRICEP TENDON;  Surgeon: Jorge Sharper, MD;  Location: [facility];  Service: Orthopedics;  Laterality: Right;   REPAIR QUADRICEPS/HAMSTRING MUSCLES Right 05/04/2020  Procedure: REPAIR QUADRICEPS/HAMSTRING MUSCLES;  Surgeon: Jorge Sharper, MD;  Location: [facility];  Service: Orthopedics;  Laterality: Right;  RIGHT QUADRICEPS   ROTATOR CUFF REPAIR Left 2009   SHOULDER ARTHROSCOPY Left 04/23/2022   Procedure: ARTHROSCOPY SHOULDER;  Surgeon: Jorge Lynwood SAUNDERS, MD;  Location: [facility];  Service: Orthopedics;  Laterality: Left;   SHOULDER FUSION SURGERY Left 2009   WRIST SURGERY Right    ganglion cyst    Current Medications: No outpatient medications  have been marked as taking for the 10/14/23 encounter (Appointment) with Jorge Bernardino HERO, PA-C.    Allergies:   Patient has no known allergies.   Social History   Socioeconomic History   Marital status: Married    Spouse name: Jorge Jordan   Number of children: 4   Years of education: Not on file   Highest education level: Associate degree: academic program  Occupational History   Occupation: disability   Tobacco Use   Smoking status: Former    Current packs/day: 0.00    Average packs/day: 0.3 packs/day for 13.0 years (3.3 ttl pk-yrs)    Types: Cigarettes    Start date: 04/08/1977    Quit date: 04/1990    Years since quitting: 33.5   Smokeless tobacco: Never  Vaping Use   Vaping status: Never Used  Substance and Sexual Activity   Alcohol use: Yes    Comment: occasionally   Drug use: No   Sexual activity: Not on file  Other Topics Concern   Not on file  Social History Narrative   Not on file   Social Drivers of Health   Financial Resource Strain: Low Risk  (10/01/2023)   Overall Financial Resource Strain (CARDIA)    Difficulty of Paying Living Expenses: Not very hard  Food Insecurity: No Food Insecurity (10/01/2023)   Hunger Vital Sign    Worried About Running Out of Food in the Last Year: Never true    Ran Out of Food in the Last Year: Never true  Transportation Needs: No Transportation Needs (10/01/2023)   PRAPARE - Administrator, Civil Service (Medical): No    Lack of Transportation (Non-Medical): No  Physical Activity: Insufficiently Active (10/01/2023)   Exercise Vital Sign    Days of Exercise per Week: 3 days    Minutes of Exercise per Session: 20 min  Stress: Stress Concern Present (10/01/2023)   [employer] of Occupational Health - Occupational Stress Questionnaire    Feeling of Stress: Rather much  Social Connections: Moderately Isolated (10/01/2023)   Social Connection and Isolation Panel    Frequency of Communication with Friends and Family: Twice a  week    Frequency of Social Gatherings with Friends and Family: Once a week    Attends Religious Services: Patient declined    Database administrator or Organizations: No    Attends Engineer, structural: Not on file    Marital Status: Married     Family History:  The patient's family history includes Cancer in his father; Depression in his mother; Diabetes in his maternal grandmother and mother; Heart attack in his maternal grandfather; Hyperlipidemia in his father and mother; Hypertension in his brother, father, and mother.  ROS:   12-point review of systems is negative unless otherwise noted in the HPI.   EKGs/Labs/Other Studies Reviewed:    Studies reviewed were summarized above. The additional studies were reviewed today:  2D echo 06/04/2023: 1. Left ventricular ejection fraction, by estimation, is 60 to 65%. The  left  ventricle has normal function. The left ventricle has no regional  wall motion abnormalities. Left ventricular diastolic parameters are  consistent with Grade I diastolic  dysfunction (impaired relaxation).   2. Right ventricular systolic function is normal. The right ventricular  size is normal. Tricuspid regurgitation signal is inadequate for assessing  PA pressure.   3. The mitral valve is normal in structure. No evidence of mitral valve  regurgitation. No evidence of mitral stenosis.   4. The aortic valve has an indeterminant number of cusps. Aortic valve  regurgitation is not visualized. No aortic stenosis is present.   5. The inferior vena cava is normal in size with greater than 50%  respiratory variability, suggesting right atrial pressure of 3 mmHg.  __________  Coronary CTA 05/26/2023: FINDINGS: Aorta:  Normal size.  No calcifications.  No dissection.   Aortic Valve:  Trileaflet.  No calcifications.   Coronary Arteries:  Normal coronary origin. Left dominance.   RCA is a non-dominant artery. There is calcified plaque in the proximal and  mid segments causing minimal stenosis (<25%).   Left main gives rise to LAD and LCX arteries. LM has no disease.   LAD has calcified plaque in the proximal to mid segments causing mild stenosis (25-49%).   LCX is a non-dominant artery. There is calcified plaque proximally causing minimal stenosis (<25%).   Other findings:   Normal pulmonary vein drainage into the left atrium.   Normal left atrial appendage without a thrombus.   Normal size of the pulmonary artery.   IMPRESSION: 1. Coronary calcium  score of 887. This was 94th percentile for age and sex matched control. 2. Normal coronary origin with right dominance. 3. Mild proximal-mid LAD stenosis (25-49%). 4. Minimal stenosis in RCA, LCx (<25%). 5. CAD-RADS 2. Mild non-obstructive CAD (25-49%). Consider preventive therapy and risk factor modification. __________  Calcium  score 02/19/2023: Ascending Aorta: Normal size   Pericardium: Normal   Coronary arteries: Normal origin of left and right coronary arteries. Distribution of arterial calcifications if present, as noted below;   LM 0   LAD 600   LCx 70.2   RCA 1.98   Total 672   IMPRESSION AND RECOMMENDATION: 1. Coronary calcium  score of 672. This was 91st percentile for age and sex matched control. 2. CAC >300 in LAD, LCx, RCA. CAC-DRS A3/N3. 3. Recommend aspirin and statin if no contraindication. 4. Recommend cardiology consultation. 5. Continue heart healthy lifestyle and risk factor modification.   EKG:  EKG is ordered today.  The EKG ordered today demonstrates ***  Recent Labs: 06/25/2023: ALT 49; BUN 10; Creatinine, Ser 0.98; Hemoglobin 16.7; Platelets 258; Potassium 3.3; Sodium 139; TSH 1.430  Recent Lipid Panel    Component Value Date/Time   CHOL 171 06/25/2023 1336   TRIG 315 (H) 06/25/2023 1336   HDL 51 06/25/2023 1336   CHOLHDL 3.4 06/25/2023 1336   LDLCALC 70 06/25/2023 1336    PHYSICAL EXAM:    VS:  There were no vitals taken for  this visit.  BMI: There is no height or weight on file to calculate BMI.  Physical Exam  Wt Readings from Last 3 Encounters:  10/01/23 205 lb 6.4 oz (93.2 kg)  07/15/23 203 lb 9.6 oz (92.4 kg)  07/14/23 203 lb (92.1 kg)     ASSESSMENT & PLAN:   Nonobstructive CAD:  HTN: Blood pressure  HLD: LDL 70 in 06/2023 with a mildly elevated AST/ALT at that time with known hepatic steatosis.  Sleep disordered breathing:   {  Are you ordering a CV Procedure (e.g. stress test, cath, DCCV, TEE, etc)?   Press F2        :[id number]}     Disposition: F/u with Dr. Darliss or an APP in ***.   Medication Adjustments/Labs and Tests Ordered: Current medicines are reviewed at length with the patient today.  Concerns regarding medicines are outlined above. Medication changes, Labs and Tests ordered today are summarized above and listed in the Patient Instructions accessible in Encounters.   Signed, Bernardino Bring, PA-C 10/13/2023 9:16 AM     [facility] [address] [place], [place] [address] [phone]

## 2023-10-14 ENCOUNTER — Ambulatory Visit: Attending: Physician Assistant | Admitting: Physician Assistant

## 2023-10-14 ENCOUNTER — Other Ambulatory Visit: Payer: Self-pay | Admitting: Family Medicine

## 2023-10-14 ENCOUNTER — Telehealth: Payer: Self-pay | Admitting: Family Medicine

## 2023-10-14 DIAGNOSIS — E1169 Type 2 diabetes mellitus with other specified complication: Secondary | ICD-10-CM

## 2023-10-14 MED ORDER — OZEMPIC (0.25 OR 0.5 MG/DOSE) 2 MG/3ML ~~LOC~~ SOPN: 0.5000 mg | PEN_INJECTOR | SUBCUTANEOUS | 0 refills | Status: DC

## 2023-10-14 NOTE — Telephone Encounter (Signed)
 Copied from CRM [id number]. Topic: Clinical - Medication Question >> Oct 14, 2023  1:15 PM Elle L wrote: Reason for CRM: The patient was given a sample of Ozempic  and was advised to contact us  if he would like to continue the medication. The patient would like a prescription for Ozempic . However it needs to go to [facility] and I have added this pharmacy to his chart.

## 2023-10-14 NOTE — Telephone Encounter (Signed)
 Rx ozempic  sent to [facility]  Marsa Officer, DO [facility], [facility] 10/14/2023, 5:32 PM

## 2023-10-20 ENCOUNTER — Ambulatory Visit: Attending: Physician Assistant | Admitting: Physician Assistant

## 2023-10-20 ENCOUNTER — Encounter: Payer: Self-pay | Admitting: Physician Assistant

## 2023-10-20 VITALS — BP 112/72 | HR 77 | Ht 68.0 in | Wt 201.8 lb

## 2023-10-20 DIAGNOSIS — I2510 Atherosclerotic heart disease of native coronary artery without angina pectoris: Secondary | ICD-10-CM

## 2023-10-20 DIAGNOSIS — E781 Pure hyperglyceridemia: Secondary | ICD-10-CM

## 2023-10-20 DIAGNOSIS — I10 Essential (primary) hypertension: Secondary | ICD-10-CM

## 2023-10-20 DIAGNOSIS — E785 Hyperlipidemia, unspecified: Secondary | ICD-10-CM

## 2023-10-20 DIAGNOSIS — G4730 Sleep apnea, unspecified: Secondary | ICD-10-CM

## 2023-10-20 NOTE — Patient Instructions (Signed)
 Medication Instructions:  Your physician recommends that you continue on your current medications as directed. Please refer to the Current Medication list given to you today.   *If you need a refill on your cardiac medications before your next appointment, please call your pharmacy*  Lab Work: None ordered at this time  If you have labs (blood work) drawn today and your tests are completely normal, you will receive your results only by: MyChart Message (if you have MyChart) OR A paper copy in the mail If you have any lab test that is abnormal or we need to change your treatment, we will call you to review the results.  Testing/Procedures: None ordered at this time   Follow-Up: At [facility], you and your health needs are our priority.  As part of our continuing mission to provide you with exceptional heart care, our providers are all part of one team.  This team includes your primary Cardiologist (physician) and Advanced Practice Providers or APPs (Physician Assistants and Nurse Practitioners) who all work together to provide you with the care you need, when you need it.  Your next appointment:   6 month(s)  Provider:   You may see Redell Cave, MD or Bernardino Bring, PA-C

## 2023-10-20 NOTE — Progress Notes (Signed)
 Cardiology Office Note    Date:  10/20/2023   ID:  Jorge Jordan Counter, DOB Apr 08, 1961, MRN [id number]  PCP:  Edman Marsa PARAS, DO  Cardiologist:  Redell Cave, MD  Electrophysiologist:  None   Chief Complaint: Follow up  History of Present Illness:   Jorge Jordan is a 63 y.o. male with history of nonobstructive CAD by coronary CTA in 05/2023, DM2, HTN, HLD, hepatic steatosis, obesity, and anxiety who presents for follow-up of CAD.  Calcium  score in 02/2023 of 672 which was the 94th percentile.  He was seen in the office in 05/2023 and noted exertional dyspnea, particularly in the summer months.  He underwent coronary CTA in 05/2023 that showed a calcium  score of 887 which was the 94th percentile.  There was 25 to 49% proximal to mid LAD stenosis and less than 25% RCA stenosis.  Noncardiac overread notable for aortic atherosclerosis and hepatic steatosis.  Echo in 05/2023 showed an EF of 60 to 65%, no regional wall motion abnormalities, grade 1 diastolic dysfunction, normal RV systolic function and ventricular cavity size, and an estimated right atrial pressure of 3 mmHg.  He was last seen in the office in 05/2023 and started on Toprol -XL with discontinuation of amlodipine  as patient reported he felt better when taking metoprolol  for coronary CTA.  He was also referred to pulmonology for sleep apnea evaluation.  He comes in doing well from a cardiac perspective and is without symptoms of angina or cardiac decompensation.  No dyspnea, palpitations, dizziness, presyncope, or syncope.  He is largely limited by prior trauma to the right knee that limits his functional status and increases his fall risk due to instability.  No symptoms concerning for bleeding.  Blood pressure is well-controlled at home typically in the 120s systolic.  Has recently been started on Ozempic  and has noticed diminished appetite without significant nausea.   Labs independently reviewed: 09/2023 - A1c 6.0 06/2023 -  Hgb 16.7, PLT 258, BUN 10, serum creatinine 0.98, potassium 3.3, albumin 4.9, AST 68, ALT 49, TSH normal, TC 171, TG 315, HDL 51, LDL 70  Past Medical History:  Diagnosis Date   Allergy    Anxiety    Arthritis    Asthma    Cancer (HCC)    squamous cell   Depression    Foot deformity    foot arthrodesis L foot   Hyperlipidemia    Hypertension    Osteoporosis    PONV (postoperative nausea and vomiting)    Pre-diabetes    Shoulder bursitis    both    Tendinitis    elbow    Past Surgical History:  Procedure Laterality Date   ARTHOSCOPIC ROTAOR CUFF REPAIR Left 04/23/2022   Procedure: ARTHROSCOPIC ROTATOR CUFF REPAIR;  Surgeon: Leora Lynwood SAUNDERS, MD;  Location: [facility];  Service: Orthopedics;  Laterality: Left;   COLONOSCOPY N/A 11/22/2020   Procedure: COLONOSCOPY;  Surgeon: Janalyn Keene NOVAK, MD;  Location: [facility];  Service: Endoscopy;  Laterality: N/A;   COLONOSCOPY WITH PROPOFOL  N/A 03/13/2023   Procedure: COLONOSCOPY WITH PROPOFOL ;  Surgeon: Unk Corinn Skiff, MD;  Location: [facility] END[facility]COPY;  Service: Gastroenterology;  Laterality: N/A;   FOOT SURGERY Left 2001   Triple arthrodesis   KNEE ARTHROSCOPY WITH LATERAL MENISECTOMY Right 05/01/2021   Procedure: KNEE ARTHROSCOPY WITH LATERAL MENISECTOMY;  Surgeon: Kathlynn Sharper, MD;  Location: [facility];  Service: Orthopedics;  Laterality: Right;   KNEE ARTHROSCOPY WITH MEDIAL MENISECTOMY Right 05/01/2021   Procedure: KNEE ARTHROSCOPY WITH  MEDIAL MENISECTOMY;  Surgeon: Kathlynn Sharper, MD;  Location: [facility];  Service: Orthopedics;  Laterality: Right;   QUADRICEPS TENDON REPAIR Right 07/14/2020   Procedure: REPAIR QUADRICEP TENDON;  Surgeon: Kathlynn Sharper, MD;  Location: [facility];  Service: Orthopedics;  Laterality: Right;   QUADRICEPS TENDON REPAIR Right 05/01/2021   Procedure: REPAIR QUADRICEP TENDON;  Surgeon: Kathlynn Sharper, MD;  Location: [facility];  Service: Orthopedics;  Laterality: Right;   REPAIR QUADRICEPS/HAMSTRING  MUSCLES Right 05/04/2020   Procedure: REPAIR QUADRICEPS/HAMSTRING MUSCLES;  Surgeon: Kathlynn Sharper, MD;  Location: [facility];  Service: Orthopedics;  Laterality: Right;  RIGHT QUADRICEPS   ROTATOR CUFF REPAIR Left 2009   SHOULDER ARTHROSCOPY Left 04/23/2022   Procedure: ARTHROSCOPY SHOULDER;  Surgeon: Leora Lynwood SAUNDERS, MD;  Location: [facility];  Service: Orthopedics;  Laterality: Left;   SHOULDER FUSION SURGERY Left 2009   WRIST SURGERY Right    ganglion cyst    Current Medications: Current Meds  Medication Sig   acetaminophen  (TYLENOL ) 500 MG tablet Take 1,000 mg by mouth every 6 (six) hours as needed for moderate pain.   aspirin EC 81 MG tablet Take 81 mg by mouth daily.   atorvastatin  (LIPITOR) 40 MG tablet Take 1 tablet (40 mg total) by mouth at bedtime.   busPIRone  (BUSPAR ) 10 MG tablet Take 1 tablet (10 mg total) by mouth 2 (two) times daily.   colchicine  0.6 MG tablet Take 2 tablets = 1.2 mg at the first sign of gout flare, followed by 0.6 mg after 1 hour. Then every day after take 1 tablet daily for up to 7-10 days until gout flare resolved   diphenhydrAMINE (BENADRYL) 25 MG tablet Take 25 mg by mouth every 6 (six) hours as needed.   lisinopril -hydrochlorothiazide  (ZESTORETIC ) 20-25 MG tablet Take 1 tablet by mouth daily.   Melatonin 10 MG TABS Take by mouth daily. qhs   metFORMIN  (GLUCOPHAGE ) 500 MG tablet Take 1 tablet (500 mg total) by mouth 2 (two) times daily with a meal.   metoprolol  succinate (TOPROL  XL) 50 MG 24 hr tablet Take 1 tablet (50 mg total) by mouth daily.   OZEMPIC , 0.25 OR 0.5 MG/DOSE, 2 MG/3ML SOPN Inject 0.5 mg into the skin once a week.   traZODone  (DESYREL ) 100 MG tablet Take 1 tablet (100 mg total) by mouth at bedtime.   vortioxetine  HBr (TRINTELLIX ) 10 MG TABS tablet Take 1 tablet (10 mg total) by mouth daily.    Allergies:   Patient has no known allergies.   Social History   Socioeconomic History   Marital status: Married    Spouse name: [name]    Number of children: 4   Years of education: Not on file   Highest education level: Associate degree: academic program  Occupational History   Occupation: disability   Tobacco Use   Smoking status: Former    Current packs/day: 0.00    Average packs/day: 0.3 packs/day for 13.0 years (3.3 ttl pk-yrs)    Types: Cigarettes    Start date: 04/08/1977    Quit date: 04/1990    Years since quitting: 33.5   Smokeless tobacco: Never  Vaping Use   Vaping status: Never Used  Substance and Sexual Activity   Alcohol use: Yes    Comment: occasionally   Drug use: No   Sexual activity: Not on file  Other Topics Concern   Not on file  Social History Narrative   Not on file   Social Drivers of Health   Financial Resource Strain:  Low Risk  (10/01/2023)   Overall Financial Resource Strain (CARDIA)    Difficulty of Paying Living Expenses: Not very hard  Food Insecurity: No Food Insecurity (10/01/2023)   Hunger Vital Sign    Worried About Running Out of Food in the Last Year: Never true    Ran Out of Food in the Last Year: Never true  Transportation Needs: No Transportation Needs (10/01/2023)   PRAPARE - Administrator, Civil Service (Medical): No    Lack of Transportation (Non-Medical): No  Physical Activity: Insufficiently Active (10/01/2023)   Exercise Vital Sign    Days of Exercise per Week: 3 days    Minutes of Exercise per Session: 20 min  Stress: Stress Concern Present (10/01/2023)   [employer] of Occupational Health - Occupational Stress Questionnaire    Feeling of Stress: Rather much  Social Connections: Moderately Isolated (10/01/2023)   Social Connection and Isolation Panel    Frequency of Communication with Friends and Family: Twice a week    Frequency of Social Gatherings with Friends and Family: Once a week    Attends Religious Services: Patient declined    Database administrator or Organizations: No    Attends Engineer, structural: Not on file    Marital  Status: Married     Family History:  The patient's family history includes Cancer in his father; Depression in his mother; Diabetes in his maternal grandmother and mother; Heart attack in his maternal grandfather; Hyperlipidemia in his father and mother; Hypertension in his brother, father, and mother.  ROS:   12-point review of systems is negative unless otherwise noted in the HPI.   EKGs/Labs/Other Studies Reviewed:    Studies reviewed were summarized above. The additional studies were reviewed today:  2D echo 06/04/2023: 1. Left ventricular ejection fraction, by estimation, is 60 to 65%. The  left ventricle has normal function. The left ventricle has no regional  wall motion abnormalities. Left ventricular diastolic parameters are  consistent with Grade I diastolic  dysfunction (impaired relaxation).   2. Right ventricular systolic function is normal. The right ventricular  size is normal. Tricuspid regurgitation signal is inadequate for assessing  PA pressure.   3. The mitral valve is normal in structure. No evidence of mitral valve  regurgitation. No evidence of mitral stenosis.   4. The aortic valve has an indeterminant number of cusps. Aortic valve  regurgitation is not visualized. No aortic stenosis is present.   5. The inferior vena cava is normal in size with greater than 50%  respiratory variability, suggesting right atrial pressure of 3 mmHg.  __________  Coronary CTA 05/26/2023: FINDINGS: Aorta:  Normal size.  No calcifications.  No dissection.   Aortic Valve:  Trileaflet.  No calcifications.   Coronary Arteries:  Normal coronary origin. Left dominance.   RCA is a non-dominant artery. There is calcified plaque in the proximal and mid segments causing minimal stenosis (<25%).   Left main gives rise to LAD and LCX arteries. LM has no disease.   LAD has calcified plaque in the proximal to mid segments causing mild stenosis (25-49%).   LCX is a non-dominant  artery. There is calcified plaque proximally causing minimal stenosis (<25%).   Other findings:   Normal pulmonary vein drainage into the left atrium.   Normal left atrial appendage without a thrombus.   Normal size of the pulmonary artery.   IMPRESSION: 1. Coronary calcium  score of 887. This was 94th percentile for age and sex  matched control. 2. Normal coronary origin with right dominance. 3. Mild proximal-mid LAD stenosis (25-49%). 4. Minimal stenosis in RCA, LCx (<25%). 5. CAD-RADS 2. Mild non-obstructive CAD (25-49%). Consider preventive therapy and risk factor modification. __________  Calcium  score 02/19/2023: Ascending Aorta: Normal size   Pericardium: Normal   Coronary arteries: Normal origin of left and right coronary arteries. Distribution of arterial calcifications if present, as noted below;   LM 0   LAD 600   LCx 70.2   RCA 1.98   Total 672   IMPRESSION AND RECOMMENDATION: 1. Coronary calcium  score of 672. This was 91st percentile for age and sex matched control. 2. CAC >300 in LAD, LCx, RCA. CAC-DRS A3/N3. 3. Recommend aspirin and statin if no contraindication. 4. Recommend cardiology consultation. 5. Continue heart healthy lifestyle and risk factor modification.   EKG:  EKG is not ordered today.   Recent Labs: 06/25/2023: ALT 49; BUN 10; Creatinine, Ser 0.98; Hemoglobin 16.7; Platelets 258; Potassium 3.3; Sodium 139; TSH 1.430  Recent Lipid Panel    Component Value Date/Time   CHOL 171 06/25/2023 1336   TRIG 315 (H) 06/25/2023 1336   HDL 51 06/25/2023 1336   CHOLHDL 3.4 06/25/2023 1336   LDLCALC 70 06/25/2023 1336    PHYSICAL EXAM:    VS:  BP 112/72   Pulse 77   Ht 5' 8 (1.727 m)   Wt 201 lb 12.8 oz (91.5 kg)   SpO2 96%   BMI 30.68 kg/m   BMI: Body mass index is 30.68 kg/m.  Physical Exam Vitals reviewed.  Constitutional:      Appearance: He is well-developed.  HENT:     Head: Normocephalic and atraumatic.  Eyes:      General:        Right eye: No discharge.        Left eye: No discharge.  Cardiovascular:     Rate and Rhythm: Normal rate and regular rhythm.     Heart sounds: Normal heart sounds, S1 normal and S2 normal. Heart sounds not distant. No midsystolic click and no opening snap. No murmur heard.    No friction rub.  Pulmonary:     Effort: Pulmonary effort is normal. No respiratory distress.     Breath sounds: Normal breath sounds. No decreased breath sounds, wheezing, rhonchi or rales.  Chest:     Chest wall: No tenderness.  Musculoskeletal:     Cervical back: Normal range of motion.     Right lower leg: No edema.     Left lower leg: No edema.  Skin:    General: Skin is warm and dry.     Nails: There is no clubbing.  Neurological:     Mental Status: He is alert and oriented to person, place, and time.  Psychiatric:        Speech: Speech normal.        Behavior: Behavior normal.        Thought Content: Thought content normal.        Judgment: Judgment normal.     Wt Readings from Last 3 Encounters:  10/20/23 201 lb 12.8 oz (91.5 kg)  10/01/23 205 lb 6.4 oz (93.2 kg)  07/15/23 203 lb 9.6 oz (92.4 kg)     ASSESSMENT & PLAN:   Nonobstructive CAD: He is without angina or symptoms concerning for cardiac decompensation.  Continue aggressive risk factor modification and primary prevention including aspirin 81 mg, atorvastatin  40 mg, and Toprol -XL 50 mg.  Coronary CTA images were  reviewed with primary/reading cardiologist with LAD stenosis felt to be moderate and nonobstructive with MD recommendation to continue current medical management given lack of anginal symptoms.  However, should patient develop symptoms concerning for angina would have low threshold for cardiac cath and would not pursue stress testing.  HTN: Blood pressure is well-controlled in the office today.  He remains on lisinopril /HCTZ 20/25 mg and Toprol -XL 50 mg.  HLD/hypertriglyceridemia: LDL 70 triglyceride of 315 in  06/2023 with a mildly elevated AST/ALT at that time with known hepatic steatosis.  Remains on atorvastatin  40 mg.  Working on heart healthy diet and has recently started Ozempic .  Will continue with lifestyle modification and recheck a fasting lipid panel at his next visit.  If his triglycerides remain elevated at that time would recommend addition of fenofibrate or Vascepa with continuation of atorvastatin .  Sleep disordered breathing: Has been referred to pulmonology.     Disposition: F/u with Dr. Darliss or an APP in 6 months.   Medication Adjustments/Labs and Tests Ordered: Current medicines are reviewed at length with the patient today.  Concerns regarding medicines are outlined above. Medication changes, Labs and Tests ordered today are summarized above and listed in the Patient Instructions accessible in Encounters.   Signed, Bernardino Bring, PA-C 10/20/2023 4:24 PM     [facility] [place] [address] [place], [place] [address] [phone]

## 2023-12-04 DIAGNOSIS — H25813 Combined forms of age-related cataract, bilateral: Secondary | ICD-10-CM | POA: Diagnosis not present

## 2023-12-04 LAB — OPHTHALMOLOGY REPORT-SCANNED

## 2023-12-09 ENCOUNTER — Encounter: Payer: Self-pay | Admitting: Family Medicine

## 2023-12-09 ENCOUNTER — Telehealth: Payer: Self-pay

## 2023-12-09 ENCOUNTER — Telehealth (INDEPENDENT_AMBULATORY_CARE_PROVIDER_SITE_OTHER): Admitting: Family Medicine

## 2023-12-09 DIAGNOSIS — U071 COVID-19: Secondary | ICD-10-CM

## 2023-12-09 MED ORDER — NIRMATRELVIR/RITONAVIR (PAXLOVID)TABLET: 3.0000 | ORAL_TABLET | Freq: Two times a day (BID) | ORAL | 0 refills | Status: AC

## 2023-12-09 MED ORDER — IPRATROPIUM BROMIDE 0.06 % NA SOLN
2.0000 | Freq: Four times a day (QID) | NASAL | 0 refills | Status: AC
Start: 2023-12-09 — End: ?

## 2023-12-09 NOTE — Progress Notes (Signed)
 Subjective:    Patient ID: Jorge Jordan, male    DOB: Mar 17, 1961, 63 y.o.   MRN: [id number]  Jorge Jordan is a 63 y.o. male presenting on 12/09/2023 for Covid Positive   Virtual / Telehealth Encounter - Video Visit via MyChart The purpose of this virtual visit is to provide medical care while limiting exposure to the novel coronavirus (COVID19) for both patient and office staff.  Consent was obtained for remote visit:  Yes.   Answered questions that patient had about telehealth interaction:  Yes.   I discussed the limitations, risks, security and privacy concerns of performing an evaluation and management service by video/telephone. I also discussed with the patient that there may be a patient responsible charge related to this service. The patient expressed understanding and agreed to proceed.  Patient Location: Home Provider Location: [facility] (Office)  Participants in virtual visit: - Patient: Jorge Jordan - CMA: Alan Fontana CMA - Provider: Dr Edman   HPI  Discussed the use of AI scribe software for clinical note transcription with the patient, who gave verbal consent to proceed.  History of Present Illness   Jorge Jordan is a 63 year old male with allergic asthma who presents with COVID-19 symptoms.  COVID-19 URI - Onset of symptoms Sunday evening, including nasal congestion and rhinorrhea - Initially attributed symptoms to allergies, but by Monday morning symptoms worsened and recognized as illness rather than allergies - Nasal discharge and congestion alternating between obstruction and runniness - No significant cough at this time Systemic symptoms - Weakness present - No fever or chills mentioned  Covid-19 infection - Positive COVID-19 test from [facility] home test - Symptoms began Sunday evening, with worsening by Monday morning  Medication use and symptom management - Currently using over-the-Jordan acetaminophen  and a mucus  relief pill for symptom management - Typically uses mucus relief when cold symptoms progress to chest  Allergic asthma - History of allergic asthma - No current asthma exacerbation or respiratory distress described        10/01/2023    2:09 PM 09/19/2023    2:44 PM 06/25/2023   10:42 AM  Depression screen PHQ 2/9  Decreased Interest 1 0 1  Down, Depressed, Hopeless 1 1 1   PHQ - 2 Score 2 1 2   Altered sleeping 2 0 1  Tired, decreased energy 2 0 1  Change in appetite 1 0 1  Feeling bad or failure about yourself  1 0 1  Trouble concentrating 0 0 1  Moving slowly or fidgety/restless 0 0 0  Suicidal thoughts 0 0 0  PHQ-9 Score 8 1 7   Difficult doing work/chores Somewhat difficult Not difficult at all Somewhat difficult       10/01/2023    2:10 PM 06/25/2023   10:42 AM 02/14/2023   11:00 AM 06/11/2022    3:40 PM  GAD 7 : Generalized Anxiety Score  Nervous, Anxious, on Edge 1 1 1 1   Control/stop worrying 1 1 2 1   Worry too much - different things 1 2 2 2   Trouble relaxing 1 2 2 1   Restless 1 1 0 2  Easily annoyed or irritable 1 1 1  0  Afraid - awful might happen 1 1 1  0  Total GAD 7 Score 7 9 9 7   Anxiety Difficulty Somewhat difficult Somewhat difficult  Somewhat difficult    Social History   Tobacco Use   Smoking status: Former    Current packs/day: 0.00  Average packs/day: 0.3 packs/day for 13.0 years (3.3 ttl pk-yrs)    Types: Cigarettes    Start date: 04/08/1977    Quit date: 04/1990    Years since quitting: 33.6   Smokeless tobacco: Never  Vaping Use   Vaping status: Never Used  Substance Use Topics   Alcohol use: Yes    Comment: occasionally   Drug use: No    Review of Systems Per HPI unless specifically indicated above     Objective:    There were no vitals taken for this visit.  Wt Readings from Last 3 Encounters:  10/20/23 201 lb 12.8 oz (91.5 kg)  10/01/23 205 lb 6.4 oz (93.2 kg)  07/15/23 203 lb 9.6 oz (92.4 kg)     Physical Exam  Note  examination was completely remotely via video observation objective data only  Gen - well-appearing, no acute distress or apparent pain, comfortable HEENT - eyes appear clear without discharge or redness Heart/Lungs - cannot examine virtually - observed no evidence of coughing or labored breathing. Abd - cannot examine virtually  Skin - face visible today- no rash Neuro - awake, alert, oriented Psych - not anxious appearing   Results for orders placed or performed in visit on 10/01/23  POCT glycosylated hemoglobin (Hb A1C)   Collection Time: 10/01/23  2:34 PM  Result Value Ref Range   Hemoglobin A1C 6.0 (A) 4.0 - 5.6 %   HbA1c POC (<> result, manual entry)     HbA1c, POC (prediabetic range)     HbA1c, POC (controlled diabetic range)        Assessment & Plan:   Problem List Items Addressed This Visit   None Visit Diagnoses       COVID-19    -  Primary   Relevant Medications   nirmatrelvir /ritonavir  (PAXLOVID ) 20 x 150 MG & 10 x 100MG  TABS   ipratropium (ATROVENT ) 0.06 % nasal spray       COVID-19 infection Acute COVID-19 confirmed on home test Discussed Paxlovid  for symptom reduction. Explained insurance coverage and drug interactions. Emphasized early treatment. However I did explain that treatment is not required. Often COVID can be self limited and rx antiviral is not required. He has history of asthma and preferred to treat this aggressively  - Prescribe Paxlovid . Notified about avoiding / limiting colchicine , sildenafil  due to drug drug interaction - Start Atrovent  nasal spray decongestant 2 sprays in each nostril up to 4 times daily for 7 days - Advise symptom monitoring and report worsening or new symptoms.  Allergic asthma Considered in COVID-19 treatment to prevent respiratory complications.    No orders of the defined types were placed in this encounter.   Meds ordered this encounter  Medications   nirmatrelvir /ritonavir  (PAXLOVID ) 20 x 150 MG & 10 x  100MG  TABS    Sig: Take 3 tablets by mouth 2 (two) times daily for 5 days.    Dispense:  30 tablet    Refill:  0   ipratropium (ATROVENT ) 0.06 % nasal spray    Sig: Place 2 sprays into both nostrils 4 (four) times daily. For up to 5-7 days then stop.    Dispense:  15 mL    Refill:  0    Follow up plan: Return if symptoms worsen or fail to improve.   Patient verbalizes understanding with the above medical recommendations including the limitation of remote medical advice.  Specific follow-up and call-back criteria were given for patient to follow-up or seek medical care more  urgently if needed.  Total duration of direct patient care provided via video conference: 9 minutes   Marsa Officer, DO [facility] 12/09/2023, 3:38 PM

## 2023-12-09 NOTE — Telephone Encounter (Signed)
 Copied from CRM [id number]. Topic: Clinical - Medication Question >> Dec 09, 2023 12:23 PM Sophia H wrote: Reason for CRM: Patient states he tested positive for covid on Sunday night and is wanting to know if Dr. MARLA can send in a prescription for Plaxovid. Please reach out and advise. # [phone]    [id number] [place], [place] - [address]

## 2023-12-09 NOTE — Patient Instructions (Addendum)

## 2023-12-19 ENCOUNTER — Telehealth: Payer: Self-pay

## 2023-12-19 NOTE — Telephone Encounter (Signed)
 Copied from CRM #[id number]. Topic: General - Other >> Dec 19, 2023  1:46 PM Fonda T wrote: Reason for CRM: Patient requesting for a prescription be sent to pharmacy for flu vaccine.  Patient can be reached at [phone]   [id number] [place], [place] - [address] [place] [address] Phone: [phone] Fax: [phone]

## 2023-12-19 NOTE — Telephone Encounter (Signed)
 Called pharmacy to verify. No Rx is needed. Patient advised.

## 2023-12-24 ENCOUNTER — Other Ambulatory Visit: Payer: Self-pay | Admitting: Family Medicine

## 2023-12-24 DIAGNOSIS — C4442 Squamous cell carcinoma of skin of scalp and neck: Secondary | ICD-10-CM | POA: Diagnosis not present

## 2023-12-24 DIAGNOSIS — E1169 Type 2 diabetes mellitus with other specified complication: Secondary | ICD-10-CM

## 2023-12-25 NOTE — Telephone Encounter (Signed)
 Requested Prescriptions  Pending Prescriptions Disp Refills   metFORMIN  (GLUCOPHAGE ) 500 MG tablet [Pharmacy Med Name: METFORMIN  HCL 500 MG TABLET] 180 tablet 0    Sig: TAKE 1 TABLET BY MOUTH 2 TIMES A DAY WITH A MEAL     Endocrinology:  Diabetes - Biguanides Failed - 12/25/2023 11:55 AM      Failed - B12 Level in normal range and within 720 days    Vitamin B-12  Date Value Ref Range Status  04/22/2017 483 232 - 1,245 pg/mL Final         Passed - Cr in normal range and within 360 days    Creatinine, Ser  Date Value Ref Range Status  06/25/2023 0.98 0.76 - 1.27 mg/dL Final         Passed - HBA1C is between 0 and 7.9 and within 180 days    Hemoglobin A1C  Date Value Ref Range Status  10/01/2023 6.0 (A) 4.0 - 5.6 % Final   Hgb A1c MFr Bld  Date Value Ref Range Status  06/25/2023 7.1 (H) 4.8 - 5.6 % Final    Comment:             Prediabetes: 5.7 - 6.4          Diabetes: >6.4          Glycemic control for adults with diabetes: <7.0          Passed - eGFR in normal range and within 360 days    GFR calc Af Amer  Date Value Ref Range Status  10/21/2019 73 >59 mL/min/1.73 Final    Comment:    **[facility] currently reports eGFR in compliance with the current**   recommendations of the [employer]. [facility] will   update reporting as new guidelines are published from the NKF-ASN   Task force.    GFR, Estimated  Date Value Ref Range Status  04/15/2022 >60 >60 mL/min Final    Comment:    (NOTE) Calculated using the CKD-EPI Creatinine Equation (2021)    eGFR  Date Value Ref Range Status  06/25/2023 87 >59 mL/min/1.73 Final         Passed - Valid encounter within last 6 months    Recent Outpatient Visits           2 weeks ago COVID-19   [place] [facility] [place], Marsa PARAS, DO   2 months ago Type 2 diabetes mellitus with other specified complication, without long-term current use of insulin [facility])   [place] [facility] [place], Marsa PARAS, DO   6 months ago Pre-diabetes   [place] [facility] [place], Marsa PARAS, DO              Passed - CBC within normal limits and completed in the last 12 months    WBC  Date Value Ref Range Status  06/25/2023 8.8 3.4 - 10.8 x10E3/uL Final  04/15/2022 3.9 (L) 4.0 - 10.5 K/uL Final   RBC  Date Value Ref Range Status  06/25/2023 4.74 4.14 - 5.80 x10E6/uL Final  04/15/2022 4.66 4.22 - 5.81 MIL/uL Final   Hemoglobin  Date Value Ref Range Status  06/25/2023 16.7 13.0 - 17.7 g/dL Final   Hematocrit  Date Value Ref Range Status  06/25/2023 46.2 37.5 - 51.0 % Final   MCHC  Date Value Ref Range Status  06/25/2023 36.1 (H) 31.5 - 35.7 g/dL Final  98/91/7975 62.9 (H) 30.0 - 36.0 g/dL  Final   MCH  Date Value Ref Range Status  06/25/2023 35.2 (H) 26.6 - 33.0 pg Final  04/15/2022 34.3 (H) 26.0 - 34.0 pg Final   MCV  Date Value Ref Range Status  06/25/2023 98 (H) 79 - 97 fL Final   No results found for: PLTCOUNTKUC, LABPLAT, POCPLA RDW  Date Value Ref Range Status  06/25/2023 12.3 11.6 - 15.4 % Final

## 2024-01-01 ENCOUNTER — Other Ambulatory Visit: Payer: Self-pay | Admitting: Family Medicine

## 2024-01-01 DIAGNOSIS — E1169 Type 2 diabetes mellitus with other specified complication: Secondary | ICD-10-CM

## 2024-01-02 NOTE — Telephone Encounter (Signed)
 Requested Prescriptions  Pending Prescriptions Disp Refills   OZEMPIC , 0.25 OR 0.5 MG/DOSE, 2 MG/3ML SOPN [Pharmacy Med Name: OZEMPIC  2 MG/3ML Subcutaneous Solution Pen-injector] 9 mL 0    Sig: INJECT 0.5 MG INTO THE SKIN ONCE A WEEK.     Endocrinology:  Diabetes - GLP-1 Receptor Agonists - semaglutide  Failed - 01/02/2024  2:04 PM      Failed - HBA1C in normal range and within 180 days    Hemoglobin A1C  Date Value Ref Range Status  10/01/2023 6.0 (A) 4.0 - 5.6 % Final   Hgb A1c MFr Bld  Date Value Ref Range Status  06/25/2023 7.1 (H) 4.8 - 5.6 % Final    Comment:             Prediabetes: 5.7 - 6.4          Diabetes: >6.4          Glycemic control for adults with diabetes: <7.0          Passed - Cr in normal range and within 360 days    Creatinine, Ser  Date Value Ref Range Status  06/25/2023 0.98 0.76 - 1.27 mg/dL Final         Passed - Valid encounter within last 6 months    Recent Outpatient Visits           3 weeks ago COVID-19   [facility] [place] Marsa PARAS, DO   3 months ago Type 2 diabetes mellitus with other specified complication, without long-term current use of insulin [facility])   [place] [facility] [place], Marsa PARAS, DO   6 months ago Pre-diabetes   [place] [facility] [place], Marsa PARAS, [place]

## 2024-01-29 NOTE — Progress Notes (Signed)
 LAKEEM ROZO                                          MRN: [id number]   01/29/2024   The [facility] Quality Team Specialist reviewed this patient medical record for the purposes of chart review for care gap closure. The following were reviewed: chart review for care gap closure-diabetic eye exam.    VBCI Quality Team

## 2024-02-02 ENCOUNTER — Telehealth: Payer: Self-pay

## 2024-02-02 DIAGNOSIS — M150 Primary generalized (osteo)arthritis: Secondary | ICD-10-CM

## 2024-02-02 DIAGNOSIS — Z125 Encounter for screening for malignant neoplasm of prostate: Secondary | ICD-10-CM

## 2024-02-02 DIAGNOSIS — E1169 Type 2 diabetes mellitus with other specified complication: Secondary | ICD-10-CM

## 2024-02-02 DIAGNOSIS — K760 Fatty (change of) liver, not elsewhere classified: Secondary | ICD-10-CM

## 2024-02-02 DIAGNOSIS — Z0000 Encounter for general adult medical examination without abnormal findings: Secondary | ICD-10-CM

## 2024-02-02 DIAGNOSIS — I10 Essential (primary) hypertension: Secondary | ICD-10-CM

## 2024-02-02 DIAGNOSIS — E782 Mixed hyperlipidemia: Secondary | ICD-10-CM

## 2024-02-02 DIAGNOSIS — E559 Vitamin D deficiency, unspecified: Secondary | ICD-10-CM

## 2024-02-02 NOTE — Telephone Encounter (Signed)
 [facility] orders placed, printed. The yshould be available at [facility] at [facility] now. Patient can pick up order requisition forms. Otherwise can call the [facility] to confirm they have access.  Marsa Officer, DO [facility] 02/02/2024, 1:21 PM

## 2024-02-02 NOTE — Telephone Encounter (Signed)
 Copied from CRM [id number]. Topic: Clinical - Request for Lab/Test Order >> Feb 02, 2024 12:23 PM Wess RAMAN wrote: Reason for CRM: Patient would like his lab order for his upcoming physical sent to [facility] inside of [facility]  Callback #: [phone]  Pharmacy: [id number] [place], [place] - [address] AT [facility] [address] [facility] [address] [place] [address] Phone: [phone] Fax: [phone] Hours: Not open 24 hours

## 2024-02-02 NOTE — Addendum Note (Signed)
 Addended by: EDMAN MARSA PARAS on: 02/02/2024 01:21 PM   Modules accepted: Orders

## 2024-02-05 DIAGNOSIS — E1169 Type 2 diabetes mellitus with other specified complication: Secondary | ICD-10-CM | POA: Diagnosis not present

## 2024-02-05 DIAGNOSIS — E559 Vitamin D deficiency, unspecified: Secondary | ICD-10-CM | POA: Diagnosis not present

## 2024-02-05 DIAGNOSIS — K760 Fatty (change of) liver, not elsewhere classified: Secondary | ICD-10-CM | POA: Diagnosis not present

## 2024-02-05 DIAGNOSIS — I10 Essential (primary) hypertension: Secondary | ICD-10-CM | POA: Diagnosis not present

## 2024-02-05 DIAGNOSIS — M150 Primary generalized (osteo)arthritis: Secondary | ICD-10-CM | POA: Diagnosis not present

## 2024-02-05 DIAGNOSIS — Z125 Encounter for screening for malignant neoplasm of prostate: Secondary | ICD-10-CM | POA: Diagnosis not present

## 2024-02-05 DIAGNOSIS — E782 Mixed hyperlipidemia: Secondary | ICD-10-CM | POA: Diagnosis not present

## 2024-02-06 LAB — CBC WITH DIFFERENTIAL/PLATELET
Basophils Absolute: 0.1 x10E3/uL (ref 0.0–0.2)
Basos: 1 %
EOS (ABSOLUTE): 0.2 x10E3/uL (ref 0.0–0.4)
Eos: 3 %
Hematocrit: 48.4 % (ref 37.5–51.0)
Hemoglobin: 16.7 g/dL (ref 13.0–17.7)
Immature Grans (Abs): 0 x10E3/uL (ref 0.0–0.1)
Immature Granulocytes: 0 %
Lymphocytes Absolute: 1.9 x10E3/uL (ref 0.7–3.1)
Lymphs: 28 %
MCH: 35.5 pg — ABNORMAL HIGH (ref 26.6–33.0)
MCHC: 34.5 g/dL (ref 31.5–35.7)
MCV: 103 fL — ABNORMAL HIGH (ref 79–97)
Monocytes Absolute: 0.8 x10E3/uL (ref 0.1–0.9)
Monocytes: 12 %
Neutrophils Absolute: 3.9 x10E3/uL (ref 1.4–7.0)
Neutrophils: 56 %
Platelets: 327 x10E3/uL (ref 150–450)
RBC: 4.71 x10E6/uL (ref 4.14–5.80)
RDW: 13.7 % (ref 11.6–15.4)
WBC: 6.9 x10E3/uL (ref 3.4–10.8)

## 2024-02-06 LAB — COMPREHENSIVE METABOLIC PANEL WITH GFR
ALT: 45 IU/L — ABNORMAL HIGH (ref 0–44)
AST: 35 IU/L (ref 0–40)
Albumin: 4.8 g/dL (ref 3.9–4.9)
Alkaline Phosphatase: 98 IU/L (ref 47–123)
BUN/Creatinine Ratio: 15 (ref 10–24)
BUN: 20 mg/dL (ref 8–27)
Bilirubin Total: 0.7 mg/dL (ref 0.0–1.2)
CO2: 24 mmol/L (ref 20–29)
Calcium: 11.1 mg/dL — ABNORMAL HIGH (ref 8.6–10.2)
Chloride: 93 mmol/L — ABNORMAL LOW (ref 96–106)
Creatinine, Ser: 1.34 mg/dL — ABNORMAL HIGH (ref 0.76–1.27)
Globulin, Total: 2.9 g/dL (ref 1.5–4.5)
Glucose: 124 mg/dL — ABNORMAL HIGH (ref 70–99)
Potassium: 4 mmol/L (ref 3.5–5.2)
Sodium: 136 mmol/L (ref 134–144)
Total Protein: 7.7 g/dL (ref 6.0–8.5)
eGFR: 60 mL/min/1.73 (ref >59)

## 2024-02-06 LAB — LIPID PANEL
Chol/HDL Ratio: 2.5 ratio (ref 0.0–5.0)
Cholesterol, Total: 114 mg/dL (ref 100–199)
HDL: 46 mg/dL (ref >39)
LDL Chol Calc (NIH): 48 mg/dL (ref 0–99)
Triglycerides: 110 mg/dL (ref 0–149)
VLDL Cholesterol Cal: 20 mg/dL (ref 5–40)

## 2024-02-06 LAB — VITAMIN D 25 HYDROXY (VIT D DEFICIENCY, FRACTURES): Vit D, 25-Hydroxy: 45.7 ng/mL (ref 30.0–100.0)

## 2024-02-06 LAB — MICROALBUMIN / CREATININE URINE RATIO
Creatinine, Urine: 162.8 mg/dL
Microalb/Creat Ratio: 4 mg/g{creat} (ref 0–29)
Microalbumin, Urine: 6.9 ug/mL

## 2024-02-06 LAB — HEMOGLOBIN A1C
Est. average glucose Bld gHb Est-mCnc: 105 mg/dL
Hgb A1c MFr Bld: 5.3 % (ref 4.8–5.6)

## 2024-02-06 LAB — PSA: Prostate Specific Ag, Serum: 1 ng/mL (ref 0.0–4.0)

## 2024-02-06 LAB — TSH: TSH: 1.98 u[IU]/mL (ref 0.450–4.500)

## 2024-02-09 ENCOUNTER — Encounter: Payer: Self-pay | Admitting: Family Medicine

## 2024-02-09 ENCOUNTER — Ambulatory Visit: Admitting: Family Medicine

## 2024-02-09 ENCOUNTER — Other Ambulatory Visit: Payer: Self-pay | Admitting: Family Medicine

## 2024-02-09 VITALS — BP 124/70 | HR 81 | Ht 68.0 in | Wt 187.1 lb

## 2024-02-09 DIAGNOSIS — Z0000 Encounter for general adult medical examination without abnormal findings: Secondary | ICD-10-CM

## 2024-02-09 DIAGNOSIS — R931 Abnormal findings on diagnostic imaging of heart and coronary circulation: Secondary | ICD-10-CM | POA: Diagnosis not present

## 2024-02-09 DIAGNOSIS — Z7985 Long-term (current) use of injectable non-insulin antidiabetic drugs: Secondary | ICD-10-CM

## 2024-02-09 DIAGNOSIS — I10 Essential (primary) hypertension: Secondary | ICD-10-CM | POA: Diagnosis not present

## 2024-02-09 DIAGNOSIS — F4310 Post-traumatic stress disorder, unspecified: Secondary | ICD-10-CM | POA: Diagnosis not present

## 2024-02-09 DIAGNOSIS — R7989 Other specified abnormal findings of blood chemistry: Secondary | ICD-10-CM

## 2024-02-09 DIAGNOSIS — R7401 Elevation of levels of liver transaminase levels: Secondary | ICD-10-CM | POA: Diagnosis not present

## 2024-02-09 DIAGNOSIS — E782 Mixed hyperlipidemia: Secondary | ICD-10-CM

## 2024-02-09 DIAGNOSIS — E1169 Type 2 diabetes mellitus with other specified complication: Secondary | ICD-10-CM

## 2024-02-09 DIAGNOSIS — Z23 Encounter for immunization: Secondary | ICD-10-CM

## 2024-02-09 MED ORDER — BUSPIRONE HCL 10 MG PO TABS
10.0000 mg | ORAL_TABLET | Freq: Two times a day (BID) | ORAL | 3 refills | Status: AC
Start: 2024-02-09 — End: ?

## 2024-02-09 MED ORDER — ATORVASTATIN CALCIUM 40 MG PO TABS: 40.0000 mg | ORAL_TABLET | Freq: Every day | ORAL | 3 refills | Status: AC

## 2024-02-09 MED ORDER — LISINOPRIL-HYDROCHLOROTHIAZIDE 20-25 MG PO TABS: 1.0000 | ORAL_TABLET | Freq: Every day | ORAL | 3 refills | Status: AC

## 2024-02-09 NOTE — Patient Instructions (Addendum)
 Thank you for coming to the office today.  Discontinue Metformin   Keep Ozempic . 0.5mg  we can adjust in future  Improvement overall on labs.  Try to hydrate more with water  Please schedule a Follow-up Appointment to: Return in about 6 months (around 08/08/2024) for 6 month DM A1c.  If you have any other questions or concerns, please feel free to call the office or send a message through MyChart. You may also schedule an earlier appointment if necessary.  Additionally, you may be receiving a survey about your experience at our office within a few days to 1 week by e-mail or mail. We value your feedback.  Marsa Officer, DO [facility], [place]

## 2024-02-09 NOTE — Progress Notes (Signed)
 Subjective:    Patient ID: Jorge Jordan, male    DOB: [birth year], 63 y.o.   MRN: [id number]  Jorge Jordan is a 63 y.o. male presenting on 02/09/2024 for Annual Exam   HPI  Discussed the use of AI scribe software for clinical note transcription with the patient, who gave verbal consent to proceed.  History of Present Illness   Jorge Jordan is a 63 year old male who presents for an annual physical exam.  Type 2 Diabetes Controlled. - Diabetes managed with Ozempic  and metformin  - Recent A1c of 5.3, improved from previous 7.1 - Not using a continuous glucose monitor - Performs regular blood glucose checks  Elevated Creatinine / Renal function - Recent creatinine increased to 1.34 from 1.1 range - Prefers tea over water, possibly affecting hydration status - Less hydration can repeat in near future with hydration  Elevated liver enzymes - Liver enzymes improved: ALT 45, AST 35 - Weight decreased to 187 pounds, potentially contributing to improved liver enzymes  Hyperlipidemia  - Cholesterol levels improved - LDL decreased to 48 - Taking atorvastatin   Prostate health - History of elevated PSA - Recent PSA decreased to 1.0  Immunization status - Received influenza, COVID, and RSV vaccines - Previously received pneumonia vaccine - Completed Shingrix series for shingles - Some confusion regarding vaccination records due to changes in pharmacy providers  Alcohol use - No significant alcohol use        Health Maintenance:   Last Colonoscopy done 03/13/23, repeat 10 years. 2034  PSA 1.0, prior 2.2 to 3.0     10/01/2023    2:09 PM 09/19/2023    2:44 PM 06/25/2023   10:42 AM  Depression screen PHQ 2/9  Decreased Interest 1 0 1  Down, Depressed, Hopeless 1 1 1   PHQ - 2 Score 2 1 2   Altered sleeping 2 0 1  Tired, decreased energy 2 0 1  Change in appetite 1 0 1  Feeling bad or failure about yourself  1 0 1  Trouble concentrating 0 0 1  Moving slowly or  fidgety/restless 0 0 0  Suicidal thoughts 0 0 0  PHQ-9 Score 8 1 7   Difficult doing work/chores Somewhat difficult Not difficult at all Somewhat difficult       10/01/2023    2:10 PM 06/25/2023   10:42 AM 02/14/2023   11:00 AM 06/11/2022    3:40 PM  GAD 7 : Generalized Anxiety Score  Nervous, Anxious, on Edge 1 1 1 1   Control/stop worrying 1 1 2 1   Worry too much - different things 1 2 2 2   Trouble relaxing 1 2 2 1   Restless 1 1 0 2  Easily annoyed or irritable 1 1 1  0  Afraid - awful might happen 1 1 1  0  Total GAD 7 Score 7 9 9 7   Anxiety Difficulty Somewhat difficult Somewhat difficult  Somewhat difficult     Past Medical History:  Diagnosis Date   Allergy    Anxiety    Arthritis    Asthma    Cancer (HCC)    squamous cell   Depression    Foot deformity    foot arthrodesis L foot   Hyperlipidemia    Hypertension    Osteoporosis    PONV (postoperative nausea and vomiting)    Pre-diabetes    Shoulder bursitis    both    Tendinitis    elbow   Past Surgical History:  Procedure Laterality  Date   ARTHOSCOPIC ROTAOR CUFF REPAIR Left 04/23/2022   Procedure: ARTHROSCOPIC ROTATOR CUFF REPAIR;  Surgeon: Leora Lynwood SAUNDERS, MD;  Location: [facility] OR[facility];  Service: Orthopedics;  Laterality: Left;   COLONOSCOPY N/A 11/22/2020   Procedure: COLONOSCOPY;  Surgeon: Janalyn Keene NOVAK, MD;  Location: [facility] ENDOSCOPY;  Service: Endoscopy;  Laterality: N/A;   COLONOSCOPY WITH PROPOFOL  N/A 03/13/2023   Procedure: COLONOSCOPY WITH PROPOFOL ;  Surgeon: Unk Corinn Skiff, MD;  Location[facility];  Service: Gastroenterology;  Laterality: N/A;   FOOT SURGERY Left 2001   Triple arthrodesis   KNEE ARTHROSCOPY WITH LATERAL MENISECTOMY Right 05/01/2021   Procedure: KNEE ARTHROSCOPY WITH LATERAL MENISECTOMY;  Surgeon: Kathlynn Sharper, MD;  Location: [facility] OR[facility];  Service: Orthopedics;  Laterality: Right;   KNEE ARTHROSCOPY WITH MEDIAL MENISECTOMY Right 05/01/2021   Procedure: KNEE ARTHROSCOPY WITH  MEDIAL MENISECTOMY;  Surgeon: Kathlynn Sharper, MD;  Location: [facility];  Service: Orthopedics;  Laterality: Right;   QUADRICEPS TENDON REPAIR Right 07/14/2020   Procedure: REPAIR QUADRICEP TENDON;  Surgeon: Kathlynn Sharper, MD;  Location: [facility];  Service: Orthopedics;  Laterality: Right;   QUADRICEPS TENDON REPAIR Right 05/01/2021   Procedure: REPAIR QUADRICEP TENDON;  Surgeon: Kathlynn Sharper, MD;  Location: [facility];  Service: Orthopedics;  Laterality: Right;   REPAIR QUADRICEPS/HAMSTRING MUSCLES Right 05/04/2020   Procedure: REPAIR QUADRICEPS/HAMSTRING MUSCLES;  Surgeon: Kathlynn Sharper, MD;  Location: [facility];  Service: Orthopedics;  Laterality: Right;  RIGHT QUADRICEPS   ROTATOR CUFF REPAIR Left 2009   SHOULDER ARTHROSCOPY Left 04/23/2022   Procedure: ARTHROSCOPY SHOULDER;  Surgeon: Leora Lynwood SAUNDERS, MD;  Location: [facility];  Service: Orthopedics;  Laterality: Left;   SHOULDER FUSION SURGERY Left 2009   WRIST SURGERY Right    ganglion cyst   Social History   Socioeconomic History   Marital status: Married    Spouse name: [name]   Number of children: 4   Years of education: Not on file   Highest education level: Associate degree: academic program  Occupational History   Occupation: disability   Tobacco Use   Smoking status: Former    Current packs/day: 0.00    Average packs/day: 0.3 packs/day for 13.0 years (3.3 ttl pk-yrs)    Types: Cigarettes    Start date: 04/08/1977    Quit date: 04/1990    Years since quitting: 33.8   Smokeless tobacco: Never  Vaping Use   Vaping status: Never Used  Substance and Sexual Activity   Alcohol use: Yes    Comment: occasionally   Drug use: No   Sexual activity: Not on file  Other Topics Concern   Not on file  Social History Narrative   Not on file   Social Drivers of Health   Financial Resource Strain: Low Risk  (10/01/2023)   Overall Financial Resource Strain (CARDIA)    Difficulty of Paying Living Expenses: Not very hard  Food  Insecurity: No Food Insecurity (10/01/2023)   Hunger Vital Sign    Worried About Running Out of Food in the Last Year: Never true    Ran Out of Food in the Last Year: Never true  Transportation Needs: No Transportation Needs (10/01/2023)   PRAPARE - Administrator, Civil Service (Medical): No    Lack of Transportation (Non-Medical): No  Physical Activity: Insufficiently Active (10/01/2023)   Exercise Vital Sign    Days of Exercise per Week: 3 days    Minutes of Exercise per Session: 20 min  Stress: Stress Concern Present (10/01/2023)  [employer] of Occupational Health - Occupational Stress Questionnaire    Feeling of Stress: Rather much  Social Connections: Moderately Isolated (10/01/2023)   Social Connection and Isolation Panel    Frequency of Communication with Friends and Family: Twice a week    Frequency of Social Gatherings with Friends and Family: Once a week    Attends Religious Services: Patient declined    Database Administrator or Organizations: No    Attends Engineer, Structural: Not on file    Marital Status: Married  Catering Manager Violence: Not At Risk (09/19/2023)   Humiliation, Afraid, Rape, and Kick questionnaire    Fear of Current or Ex-Partner: No    Emotionally Abused: No    Physically Abused: No    Sexually Abused: No   Family History  Problem Relation Age of Onset   Hyperlipidemia Mother    Hypertension Mother    Diabetes Mother    Depression Mother    Hypertension Father    Hyperlipidemia Father    Cancer Father        skin cancer   Hypertension Brother    Diabetes Maternal Grandmother    Heart attack Maternal Grandfather    Current Outpatient Medications on File Prior to Visit  Medication Sig   acetaminophen  (TYLENOL ) 500 MG tablet Take 1,000 mg by mouth every 6 (six) hours as needed for moderate pain.   aspirin EC 81 MG tablet Take 81 mg by mouth daily.   colchicine  0.6 MG tablet Take 2 tablets = 1.2 mg at the first sign  of gout flare, followed by 0.6 mg after 1 hour. Then every day after take 1 tablet daily for up to 7-10 days until gout flare resolved   diphenhydrAMINE (BENADRYL) 25 MG tablet Take 25 mg by mouth every 6 (six) hours as needed.   ipratropium (ATROVENT ) 0.06 % nasal spray Place 2 sprays into both nostrils 4 (four) times daily. For up to 5-7 days then stop.   Melatonin 10 MG TABS Take by mouth daily. qhs   metoprolol  succinate (TOPROL  XL) 50 MG 24 hr tablet Take 1 tablet (50 mg total) by mouth daily.   OZEMPIC , 0.25 OR 0.5 MG/DOSE, 2 MG/3ML SOPN INJECT 0.5 MG INTO THE SKIN ONCE A WEEK.   traZODone  (DESYREL ) 100 MG tablet Take 1 tablet (100 mg total) by mouth at bedtime.   vortioxetine  HBr (TRINTELLIX ) 10 MG TABS tablet Take 1 tablet (10 mg total) by mouth daily.   No current facility-administered medications on file prior to visit.    Review of Systems  Constitutional:  Negative for activity change, appetite change, chills, diaphoresis, fatigue and fever.  HENT:  Negative for congestion and hearing loss.   Eyes:  Negative for visual disturbance.  Respiratory:  Negative for cough, chest tightness, shortness of breath and wheezing.   Cardiovascular:  Negative for chest pain, palpitations and leg swelling.  Gastrointestinal:  Negative for abdominal pain, constipation, diarrhea, nausea and vomiting.  Genitourinary:  Negative for dysuria, frequency and hematuria.  Musculoskeletal:  Negative for arthralgias and neck pain.  Skin:  Negative for rash.  Neurological:  Negative for dizziness, weakness, light-headedness, numbness and headaches.  Hematological:  Negative for adenopathy.  Psychiatric/Behavioral:  Negative for behavioral problems, dysphoric mood and sleep disturbance.    Per HPI unless specifically indicated above     Objective:    BP 124/70 (BP Location: Right Arm, Patient Position: Sitting, Cuff Size: Normal)   Pulse 81   Ht 5'  8 (1.727 m)   Wt 187 lb 2 oz (84.9 kg)   SpO2 98%    BMI 28.45 kg/m   Wt Readings from Last 3 Encounters:  02/09/24 187 lb 2 oz (84.9 kg)  10/20/23 201 lb 12.8 oz (91.5 kg)  10/01/23 205 lb 6.4 oz (93.2 kg)    Physical Exam Vitals and nursing note reviewed.  Constitutional:      General: He is not in acute distress.    Appearance: He is well-developed. He is not diaphoretic.     Comments: Well-appearing, comfortable, cooperative  HENT:     Head: Normocephalic and atraumatic.  Eyes:     General:        Right eye: No discharge.        Left eye: No discharge.     Conjunctiva/sclera: Conjunctivae normal.     Pupils: Pupils are equal, round, and reactive to light.  Neck:     Thyroid : No thyromegaly.  Cardiovascular:     Rate and Rhythm: Normal rate and regular rhythm.     Pulses: Normal pulses.     Heart sounds: Normal heart sounds. No murmur heard. Pulmonary:     Effort: Pulmonary effort is normal. No respiratory distress.     Breath sounds: Normal breath sounds. No wheezing or rales.  Abdominal:     General: Bowel sounds are normal. There is no distension.     Palpations: Abdomen is soft. There is no mass.     Tenderness: There is no abdominal tenderness.  Musculoskeletal:        General: No tenderness. Normal range of motion.     Cervical back: Normal range of motion and neck supple.     Comments: Upper / Lower Extremities: - Normal muscle tone, strength bilateral upper extremities 5/5, lower extremities 5/5  Lymphadenopathy:     Cervical: No cervical adenopathy.  Skin:    General: Skin is warm and dry.     Findings: No erythema or rash.  Neurological:     Mental Status: He is alert and oriented to person, place, and time.     Comments: Distal sensation intact to light touch all extremities  Psychiatric:        Mood and Affect: Mood normal.        Behavior: Behavior normal.        Thought Content: Thought content normal.     Comments: Well groomed, good eye contact, normal speech and thoughts     Results for  orders placed or performed in visit on 02/02/24  Lipid panel   Collection Time: 02/05/24 10:53 AM  Result Value Ref Range   Cholesterol, Total 114 100 - 199 mg/dL   Triglycerides 889 0 - 149 mg/dL   HDL 46 >60 mg/dL   VLDL Cholesterol Cal 20 5 - 40 mg/dL   LDL Chol Calc (NIH) 48 0 - 99 mg/dL   Chol/HDL Ratio 2.5 0.0 - 5.0 ratio  Hemoglobin A1c   Collection Time: 02/05/24 10:53 AM  Result Value Ref Range   Hgb A1c MFr Bld 5.3 4.8 - 5.6 %   Est. average glucose Bld gHb Est-mCnc 105 mg/dL  CBC with Differential/Platelet   Collection Time: 02/05/24 10:53 AM  Result Value Ref Range   WBC 6.9 3.4 - 10.8 x10E3/uL   RBC 4.71 4.14 - 5.80 x10E6/uL   Hemoglobin 16.7 13.0 - 17.7 g/dL   Hematocrit 51.5 62.4 - 51.0 %   MCV 103 (H) 79 - 97 fL  MCH 35.5 (H) 26.6 - 33.0 pg   MCHC 34.5 31.5 - 35.7 g/dL   RDW 86.2 88.3 - 84.5 %   Platelets 327 150 - 450 x10E3/uL   Neutrophils 56 Not Estab. %   Lymphs 28 Not Estab. %   Monocytes 12 Not Estab. %   Eos 3 Not Estab. %   Basos 1 Not Estab. %   Neutrophils Absolute 3.9 1.4 - 7.0 x10E3/uL   Lymphocytes Absolute 1.9 0.7 - 3.1 x10E3/uL   Monocytes Absolute 0.8 0.1 - 0.9 x10E3/uL   EOS (ABSOLUTE) 0.2 0.0 - 0.4 x10E3/uL   Basophils Absolute 0.1 0.0 - 0.2 x10E3/uL   Immature Granulocytes 0 Not Estab. %   Immature Grans (Abs) 0.0 0.0 - 0.1 x10E3/uL  PSA   Collection Time: 02/05/24 10:53 AM  Result Value Ref Range   Prostate Specific Ag, Serum 1.0 0.0 - 4.0 ng/mL  Microalbumin / creatinine urine ratio   Collection Time: 02/05/24 10:53 AM  Result Value Ref Range   Creatinine, Urine 162.8 Not Estab. mg/dL   Microalbumin, Urine 6.9 Not Estab. ug/mL   Microalb/Creat Ratio 4 0 - 29 mg/g creat  TSH   Collection Time: 02/05/24 10:53 AM  Result Value Ref Range   TSH 1.980 0.450 - 4.500 uIU/mL  Comprehensive metabolic panel with GFR   Collection Time: 02/05/24 10:53 AM  Result Value Ref Range   Glucose 124 (H) 70 - 99 mg/dL   BUN 20 8 - 27 mg/dL    Creatinine, Ser 8.65 (H) 0.76 - 1.27 mg/dL   eGFR 60 >40 fO/fpw/8.26   BUN/Creatinine Ratio 15 10 - 24   Sodium 136 134 - 144 mmol/L   Potassium 4.0 3.5 - 5.2 mmol/L   Chloride 93 (L) 96 - 106 mmol/L   CO2 24 20 - 29 mmol/L   Calcium  11.1 (H) 8.6 - 10.2 mg/dL   Total Protein 7.7 6.0 - 8.5 g/dL   Albumin 4.8 3.9 - 4.9 g/dL   Globulin, Total 2.9 1.5 - 4.5 g/dL   Bilirubin Total 0.7 0.0 - 1.2 mg/dL   Alkaline Phosphatase 98 47 - 123 IU/L   AST 35 0 - 40 IU/L   ALT 45 (H) 0 - 44 IU/L  VITAMIN D  25 Hydroxy (Vit-D Deficiency, Fractures)   Collection Time: 02/05/24 10:53 AM  Result Value Ref Range   Vit D, 25-Hydroxy 45.7 30.0 - 100.0 ng/mL      Assessment & Plan:   Problem List Items Addressed This Visit     Elevated serum creatinine   Elevated transaminase level   Essential hypertension   Relevant Medications   lisinopril -hydrochlorothiazide  (ZESTORETIC ) 20-25 MG tablet   atorvastatin  (LIPITOR) 40 MG tablet   Hyperlipidemia   Relevant Medications   lisinopril -hydrochlorothiazide  (ZESTORETIC ) 20-25 MG tablet   atorvastatin  (LIPITOR) 40 MG tablet   PTSD (post-traumatic stress disorder)   Relevant Medications   busPIRone  (BUSPAR ) 10 MG tablet   Type 2 diabetes mellitus with other specified complication (HCC)   Relevant Medications   lisinopril -hydrochlorothiazide  (ZESTORETIC ) 20-25 MG tablet   atorvastatin  (LIPITOR) 40 MG tablet   Other Visit Diagnoses       Annual physical exam    -  Primary     Need for Streptococcus pneumoniae vaccination       Relevant Orders   Pneumococcal conjugate vaccine 20-valent (Completed)     Elevated coronary artery calcium  score       Relevant Medications   lisinopril -hydrochlorothiazide  (ZESTORETIC ) 20-25 MG tablet  atorvastatin  (LIPITOR) 40 MG tablet     Long-term current use of injectable noninsulin antidiabetic medication            Updated Health Maintenance information Reviewed recent lab results with patient Encouraged  improvement to lifestyle with diet and exercise Goal of weight loss  Adult Wellness Visit Annual exam showed improved weight trend and liver enzyme levels. No major concerns. - Continue current health maintenance and lifestyle modifications.  Immunization management Pneumonia vaccination was due.  - Administered Prevnar 20 pneumonia vaccine today.  Type 2 diabetes mellitus A1c improved from 7.1 to 5.3. Ozempic  continued, metformin  discontinued. Discussed potential A1c increase without metformin . Continuous glucose monitoring not covered by insurance. - Discontinued metformin . - Continue Ozempic . - Monitor blood glucose levels as needed, reducing frequency of checks.  Essential hypertension Blood pressure management ongoing with lisinopril . - Continue lisinopril .  Mixed hyperlipidemia Cholesterol levels improved with LDL at 48 on atorvastatin . - Continue atorvastatin .  Chronic liver enzyme elevation Liver enzymes improved with ALT at 45 and AST at 35. Weight loss may be contributing. - Continue monitoring liver enzyme levels.  Chronic kidney function monitoring Creatinine elevated at 1.34, possibly due to dehydration. Normal urine microalbumin. Discussed hydration. - Increase water intake to improve hydration. - Monitor kidney function with future blood tests.  Prostate cancer surveillance PSA level at 1.0, showing downward trend. No signs of prostate cancer. - Continue monitoring PSA levels.        Orders Placed This Encounter  Procedures   Pneumococcal conjugate vaccine 20-valent    Meds ordered this encounter  Medications   lisinopril -hydrochlorothiazide  (ZESTORETIC ) 20-25 MG tablet    Sig: Take 1 tablet by mouth daily.    Dispense:  90 tablet    Refill:  3    Add extra refills   busPIRone  (BUSPAR ) 10 MG tablet    Sig: Take 1 tablet (10 mg total) by mouth 2 (two) times daily.    Dispense:  180 tablet    Refill:  3    Add future refills   atorvastatin   (LIPITOR) 40 MG tablet    Sig: Take 1 tablet (40 mg total) by mouth at bedtime.    Dispense:  90 tablet    Refill:  3    Add future refills     Follow up plan: Return in about 6 months (around 08/08/2024) for 6 month Follow-up Diabetes, Creatinine updates [facility] labs.  Marsa Officer, DO [facility] 02/09/2024, 2:06 PM

## 2024-02-17 DIAGNOSIS — D2261 Melanocytic nevi of right upper limb, including shoulder: Secondary | ICD-10-CM | POA: Diagnosis not present

## 2024-02-17 DIAGNOSIS — Z85828 Personal history of other malignant neoplasm of skin: Secondary | ICD-10-CM | POA: Diagnosis not present

## 2024-02-17 DIAGNOSIS — D225 Melanocytic nevi of trunk: Secondary | ICD-10-CM | POA: Diagnosis not present

## 2024-02-17 DIAGNOSIS — D0461 Carcinoma in situ of skin of right upper limb, including shoulder: Secondary | ICD-10-CM | POA: Diagnosis not present

## 2024-02-17 DIAGNOSIS — D2272 Melanocytic nevi of left lower limb, including hip: Secondary | ICD-10-CM | POA: Diagnosis not present

## 2024-02-17 DIAGNOSIS — L570 Actinic keratosis: Secondary | ICD-10-CM | POA: Diagnosis not present

## 2024-02-17 DIAGNOSIS — D2239 Melanocytic nevi of other parts of face: Secondary | ICD-10-CM | POA: Diagnosis not present

## 2024-02-17 DIAGNOSIS — D2262 Melanocytic nevi of left upper limb, including shoulder: Secondary | ICD-10-CM | POA: Diagnosis not present

## 2024-02-17 DIAGNOSIS — D0462 Carcinoma in situ of skin of left upper limb, including shoulder: Secondary | ICD-10-CM | POA: Diagnosis not present

## 2024-02-17 DIAGNOSIS — D485 Neoplasm of uncertain behavior of skin: Secondary | ICD-10-CM | POA: Diagnosis not present

## 2024-03-01 DIAGNOSIS — J45909 Unspecified asthma, uncomplicated: Secondary | ICD-10-CM | POA: Diagnosis not present

## 2024-03-01 DIAGNOSIS — K760 Fatty (change of) liver, not elsewhere classified: Secondary | ICD-10-CM | POA: Diagnosis not present

## 2024-03-01 DIAGNOSIS — Z833 Family history of diabetes mellitus: Secondary | ICD-10-CM | POA: Diagnosis not present

## 2024-03-01 DIAGNOSIS — F419 Anxiety disorder, unspecified: Secondary | ICD-10-CM | POA: Diagnosis not present

## 2024-03-01 DIAGNOSIS — Z7982 Long term (current) use of aspirin: Secondary | ICD-10-CM | POA: Diagnosis not present

## 2024-03-01 DIAGNOSIS — Z7985 Long-term (current) use of injectable non-insulin antidiabetic drugs: Secondary | ICD-10-CM | POA: Diagnosis not present

## 2024-03-01 DIAGNOSIS — F4310 Post-traumatic stress disorder, unspecified: Secondary | ICD-10-CM | POA: Diagnosis not present

## 2024-03-01 DIAGNOSIS — Z89012 Acquired absence of left thumb: Secondary | ICD-10-CM | POA: Diagnosis not present

## 2024-03-01 DIAGNOSIS — Z8249 Family history of ischemic heart disease and other diseases of the circulatory system: Secondary | ICD-10-CM | POA: Diagnosis not present

## 2024-03-01 DIAGNOSIS — E785 Hyperlipidemia, unspecified: Secondary | ICD-10-CM | POA: Diagnosis not present

## 2024-03-01 DIAGNOSIS — I2510 Atherosclerotic heart disease of native coronary artery without angina pectoris: Secondary | ICD-10-CM | POA: Diagnosis not present

## 2024-03-01 DIAGNOSIS — I10 Essential (primary) hypertension: Secondary | ICD-10-CM | POA: Diagnosis not present

## 2024-03-01 DIAGNOSIS — E1136 Type 2 diabetes mellitus with diabetic cataract: Secondary | ICD-10-CM | POA: Diagnosis not present

## 2024-03-16 ENCOUNTER — Other Ambulatory Visit: Payer: Self-pay | Admitting: Family Medicine

## 2024-03-16 DIAGNOSIS — E1169 Type 2 diabetes mellitus with other specified complication: Secondary | ICD-10-CM

## 2024-03-18 NOTE — Telephone Encounter (Signed)
 Requested Prescriptions  Pending Prescriptions Disp Refills   OZEMPIC , 0.25 OR 0.5 MG/DOSE, 2 MG/3ML SOPN [Pharmacy Med Name: OZEMPIC  2 MG/3ML Subcutaneous Solution Pen-injector] 9 mL 0    Sig: INJECT 0.5 MG INTO THE SKIN ONCE A WEEK.     Endocrinology:  Diabetes - GLP-1 Receptor Agonists - semaglutide  Failed - 03/18/2024  1:20 PM      Failed - Cr in normal range and within 360 days    Creatinine, Ser  Date Value Ref Range Status  02/05/2024 1.34 (H) 0.76 - 1.27 mg/dL Final         Passed - HBA1C in normal range and within 180 days    Hgb A1c MFr Bld  Date Value Ref Range Status  02/05/2024 5.3 4.8 - 5.6 % Final    Comment:             Prediabetes: 5.7 - 6.4          Diabetes: >6.4          Glycemic control for adults with diabetes: <7.0          Passed - Valid encounter within last 6 months    Recent Outpatient Visits           1 month ago Annual physical exam   [place] [facility] [place], Marsa PARAS, DO   3 months ago COVID-19   [facility] [place], Marsa PARAS, DO   5 months ago Type 2 diabetes mellitus with other specified complication, without long-term current use of insulin [facility])   [place] [facility] [place], Marsa PARAS, DO   8 months ago Pre-diabetes    [facility] [place], Marsa PARAS, [place]

## 2024-03-24 ENCOUNTER — Other Ambulatory Visit: Payer: Self-pay | Admitting: Family Medicine

## 2024-03-24 DIAGNOSIS — E1169 Type 2 diabetes mellitus with other specified complication: Secondary | ICD-10-CM

## 2024-03-24 DIAGNOSIS — I10 Essential (primary) hypertension: Secondary | ICD-10-CM

## 2024-03-26 ENCOUNTER — Telehealth: Payer: Self-pay

## 2024-03-26 NOTE — Telephone Encounter (Signed)
 Discontinued 02/09/24.  Requested Prescriptions  Pending Prescriptions Disp Refills   amLODipine  (NORVASC ) 5 MG tablet [Pharmacy Med Name: amLODIPine  BESYLATE 5 MG TAB] 90 tablet 3    Sig: TAKE 1 TABLET BY MOUTH DAILY     Cardiovascular: Calcium  Channel Blockers 2 Passed - 03/26/2024  4:18 PM      Passed - Last BP in normal range    BP Readings from Last 1 Encounters:  02/09/24 124/70         Passed - Last Heart Rate in normal range    Pulse Readings from Last 1 Encounters:  02/09/24 81         Passed - Valid encounter within last 6 months    Recent Outpatient Visits           1 month ago Annual physical exam   [place] [facility] [place], Marsa PARAS, DO   3 months ago COVID-19   [facility] [place], Marsa PARAS, DO   5 months ago Type 2 diabetes mellitus with other specified complication, without long-term current use of insulin [facility])   [place] [facility] [place], Marsa PARAS, DO   9 months ago Pre-diabetes   [place] [facility] [address], Marsa PARAS, DO               metFORMIN  (GLUCOPHAGE ) 500 MG tablet [Pharmacy Med Name: METFORMIN  HCL 500 MG TABLET] 180 tablet 0    Sig: TAKE 1 TABLET BY MOUTH 2 TIMES A DAY WITH A MEAL     Endocrinology:  Diabetes - Biguanides Failed - 03/26/2024  4:18 PM      Failed - Cr in normal range and within 360 days    Creatinine, Ser  Date Value Ref Range Status  02/05/2024 1.34 (H) 0.76 - 1.27 mg/dL Final         Failed - B12 Level in normal range and within 720 days    Vitamin B-12  Date Value Ref Range Status  04/22/2017 483 232 - 1,245 pg/mL Final         Passed - HBA1C is between 0 and 7.9 and within 180 days    Hgb A1c MFr Bld  Date Value Ref Range Status  02/05/2024 5.3 4.8 - 5.6 % Final    Comment:             Prediabetes: 5.7 - 6.4          Diabetes: >6.4          Glycemic control for adults with diabetes: <7.0           Passed - eGFR in normal range and within 360 days    GFR calc Af Amer  Date Value Ref Range Status  10/21/2019 73 >59 mL/min/1.73 Final    Comment:    **[facility] currently reports eGFR in compliance with the current**   recommendations of the [employer]. [facility] will   update reporting as new guidelines are published from the NKF-ASN   Task force.    GFR, Estimated  Date Value Ref Range Status  04/15/2022 >60 >60 mL/min Final    Comment:    (NOTE) Calculated using the CKD-EPI Creatinine Equation (2021)    eGFR  Date Value Ref Range Status  02/05/2024 60 >59 mL/min/1.73 Final         Passed - Valid encounter within last 6 months    Recent Outpatient Visits  1 month ago Annual physical exam   [place] [facility] [place], Marsa PARAS, DO   3 months ago COVID-19   [facility] [place], Marsa PARAS, DO   5 months ago Type 2 diabetes mellitus with other specified complication, without long-term current use of insulin [facility])   [place] [facility] [place] Marsa PARAS, DO   9 months ago Pre-diabetes   [place] [facility] [place], Marsa PARAS, DO              Passed - CBC within normal limits and completed in the last 12 months    WBC  Date Value Ref Range Status  02/05/2024 6.9 3.4 - 10.8 x10E3/uL Final  04/15/2022 3.9 (L) 4.0 - 10.5 K/uL Final   RBC  Date Value Ref Range Status  02/05/2024 4.71 4.14 - 5.80 x10E6/uL Final  04/15/2022 4.66 4.22 - 5.81 MIL/uL Final   Hemoglobin  Date Value Ref Range Status  02/05/2024 16.7 13.0 - 17.7 g/dL Final   Hematocrit  Date Value Ref Range Status  02/05/2024 48.4 37.5 - 51.0 % Final   MCHC  Date Value Ref Range Status  02/05/2024 34.5 31.5 - 35.7 g/dL Final  98/91/7975 62.9 (H) 30.0 - 36.0 g/dL Final   [facility]  Date Value Ref Range Status  02/05/2024 35.5 (H) 26.6 - 33.0 pg Final   04/15/2022 34.3 (H) 26.0 - 34.0 pg Final   MCV  Date Value Ref Range Status  02/05/2024 103 (H) 79 - 97 fL Final   No results found for: PLTCOUNTKUC, LABPLAT, POCPLA RDW  Date Value Ref Range Status  02/05/2024 13.7 11.6 - 15.4 % Final

## 2024-03-26 NOTE — Telephone Encounter (Signed)
 Received a refill request for Amlodipine  5 mg tab. I do not see on current med list. Please advise. Thanks! [facility].

## 2024-03-26 NOTE — Telephone Encounter (Signed)
 He should only be taking lisinopril  HCT for blood pressure.  His cardiologist stopped amlodipine  and metoprolol  07/14/2023 at a follow-up visit.

## 2024-05-04 ENCOUNTER — Ambulatory Visit: Admitting: Physician Assistant

## 2024-05-04 ENCOUNTER — Ambulatory Visit: Attending: Cardiology | Admitting: Cardiology

## 2024-05-04 VITALS — BP 128/78 | HR 85 | Ht 68.0 in | Wt 191.4 lb

## 2024-05-04 DIAGNOSIS — I10 Essential (primary) hypertension: Secondary | ICD-10-CM | POA: Diagnosis not present

## 2024-05-04 DIAGNOSIS — I2510 Atherosclerotic heart disease of native coronary artery without angina pectoris: Secondary | ICD-10-CM | POA: Diagnosis not present

## 2024-05-04 DIAGNOSIS — E782 Mixed hyperlipidemia: Secondary | ICD-10-CM | POA: Diagnosis not present

## 2024-05-04 NOTE — Progress Notes (Signed)
 " Cardiology Office Note:    Date:  05/04/2024   ID:  Jorge Jordan Counter, DOB 08/30/1960, MRN [id number]  PCP:  Edman Marsa PARAS, DO   [facility] Providers Cardiologist:  Redell Cave, MD     Referring MD: Edman Marsa PARAS, DO   Chief Complaint  Patient presents with   Follow-up    Doing well, no complaints today.     History of Present Illness:    Jorge Jordan is a 64 y.o. male with a hx of CAD (CCTA 2/25, 25-49% proximal LAD, minimal RCA, left circumflex), hypertension, hyperlipidemia, anxiety who presents for follow-up.  Doing okay, denies chest pain or shortness of breath.  BP adequately controlled.  Compliant with medications as prescribed.  Feels well, has no concerns at this time  Prior notes/testing screening calcium  scan 02/19/2023 showing score of 672, 91st percentile.    Past Medical History:  Diagnosis Date   Allergy    Anxiety    Arthritis    Asthma    Cancer (HCC)    squamous cell   Depression    Foot deformity    foot arthrodesis L foot   Hyperlipidemia    Hypertension    Osteoporosis    PONV (postoperative nausea and vomiting)    Pre-diabetes    Shoulder bursitis    both    Tendinitis    elbow    Past Surgical History:  Procedure Laterality Date   ARTHOSCOPIC ROTAOR CUFF REPAIR Left 04/23/2022   Procedure: ARTHROSCOPIC ROTATOR CUFF REPAIR;  Surgeon: Leora Lynwood SAUNDERS, MD;  Location: [facility] ORS;  Service: Orthopedics;  Laterality: Left;   COLONOSCOPY N/A 11/22/2020   Procedure: COLONOSCOPY;  Surgeon: Janalyn Keene NOVAK, MD;  Location: [facility] ENDOSCOPY;  Service: Endoscopy;  Laterality: N/A;   COLONOSCOPY WITH PROPOFOL  N/A 03/13/2023   Procedure: COLONOSCOPY WITH PROPOFOL ;  Surgeon: Unk Corinn Skiff, MD;  Location[facility] ENDOSCOPY;  Service: Gastroenterology;  Laterality: N/A;   FOOT SURGERY Left 2001   Triple arthrodesis   KNEE ARTHROSCOPY WITH LATERAL MENISECTOMY Right 05/01/2021   Procedure: KNEE ARTHROSCOPY WITH  LATERAL MENISECTOMY;  Surgeon: Kathlynn Sharper, MD;  Location: [facility] ORS;  Service: Orthopedics;  Laterality: Right;   KNEE ARTHROSCOPY WITH MEDIAL MENISECTOMY Right 05/01/2021   Procedure: KNEE ARTHROSCOPY WITH MEDIAL MENISECTOMY;  Surgeon: Kathlynn Sharper, MD;  Location: [facility] OR[facility];  Service: Orthopedics;  Laterality: Right;   QUADRICEPS TENDON REPAIR Right 07/14/2020   Procedure: REPAIR QUADRICEP TENDON;  Surgeon: Kathlynn Sharper, MD;  Location: [facility] OR[facility];  Service: Orthopedics;  Laterality: Right;   QUADRICEPS TENDON REPAIR Right 05/01/2021   Procedure: REPAIR QUADRICEP TENDON;  Surgeon: Kathlynn Sharper, MD;  Location: [facility] OR[facility];  Service: Orthopedics;  Laterality: Right;   REPAIR QUADRICEPS/HAMSTRING MUSCLES Right 05/04/2020   Procedure: REPAIR QUADRICEPS/HAMSTRING MUSCLES;  Surgeon: Kathlynn Sharper, MD;  Location: [facility] OR[facility];  Service: Orthopedics;  Laterality: Right;  RIGHT QUADRICEPS   ROTATOR CUFF REPAIR Left 2009   SHOULDER ARTHROSCOPY Left 04/23/2022   Procedure: ARTHROSCOPY SHOULDER;  Surgeon: Leora Lynwood SAUNDERS, MD;  Location: [facility] ORS;  Service: Orthopedics;  Laterality: Left;   SHOULDER FUSION SURGERY Left 2009   WRIST SURGERY Right    ganglion cyst    Current Medications: Current Meds  Medication Sig   acetaminophen  (TYLENOL ) 500 MG tablet Take 1,000 mg by mouth every 6 (six) hours as needed for moderate pain.   aspirin EC 81 MG tablet Take 81 mg by mouth daily.   atorvastatin  (LIPITOR) 40 MG tablet Take 1  tablet (40 mg total) by mouth at bedtime.   busPIRone  (BUSPAR ) 10 MG tablet Take 1 tablet (10 mg total) by mouth 2 (two) times daily.   colchicine  0.6 MG tablet Take 2 tablets = 1.2 mg at the first sign of gout flare, followed by 0.6 mg after 1 hour. Then every day after take 1 tablet daily for up to 7-10 days until gout flare resolved   diphenhydrAMINE (BENADRYL) 25 MG tablet Take 25 mg by mouth every 6 (six) hours as needed.   ipratropium (ATROVENT ) 0.06 % nasal spray Place 2 sprays into  both nostrils 4 (four) times daily. For up to 5-7 days then stop.   lisinopril -hydrochlorothiazide  (ZESTORETIC ) 20-25 MG tablet Take 1 tablet by mouth daily.   Melatonin 10 MG TABS Take by mouth daily. qhs   metoprolol  succinate (TOPROL  XL) 50 MG 24 hr tablet Take 1 tablet (50 mg total) by mouth daily.   OZEMPIC , 0.25 OR 0.5 MG/DOSE, 2 MG/3ML SOPN INJECT 0.5 MG INTO THE SKIN ONCE A WEEK.   traZODone  (DESYREL ) 100 MG tablet Take 1 tablet (100 mg total) by mouth at bedtime.   vortioxetine  HBr (TRINTELLIX ) 10 MG TABS tablet Take 1 tablet (10 mg total) by mouth daily.     Allergies:   Patient has no known allergies.   Social History   Socioeconomic History   Marital status: Married    Spouse name: Jorge Jordan   Number of children: 4   Years of education: Not on file   Highest education level: Associate degree: academic program  Occupational History   Occupation: disability   Tobacco Use   Smoking status: Former    Current packs/day: 0.00    Average packs/day: 0.3 packs/day for 13.0 years (3.3 ttl pk-yrs)    Types: Cigarettes    Start date: 04/08/1977    Quit date: 04/1990    Years since quitting: 34.0   Smokeless tobacco: Never  Vaping Use   Vaping status: Never Used  Substance and Sexual Activity   Alcohol use: Yes    Comment: occasionally   Drug use: No   Sexual activity: Not on file  Other Topics Concern   Not on file  Social History Narrative   Not on file   Social Drivers of Health   Tobacco Use: Medium Risk (02/09/2024)   Patient History    Smoking Tobacco Use: Former    Smokeless Tobacco Use: Never    Passive Exposure: Not on Actuary Strain: Low Risk (10/01/2023)   Overall Financial Resource Strain (CARDIA)    Difficulty of Paying Living Expenses: Not very hard  Food Insecurity: No Food Insecurity (10/01/2023)   Epic    Worried About Radiation Protection Practitioner of Food in the Last Year: Never true    Ran Out of Food in the Last Year: Never true  Transportation Needs:  No Transportation Needs (10/01/2023)   Epic    Lack of Transportation (Medical): No    Lack of Transportation (Non-Medical): No  Physical Activity: Insufficiently Active (10/01/2023)   Exercise Vital Sign    Days of Exercise per Week: 3 days    Minutes of Exercise per Session: 20 min  Stress: Stress Concern Present (10/01/2023)   [employer] of Occupational Health - Occupational Stress Questionnaire    Feeling of Stress: Rather much  Social Connections: Moderately Isolated (10/01/2023)   Social Connection and Isolation Panel    Frequency of Communication with Friends and Family: Twice a week    Frequency of  Social Gatherings with Friends and Family: Once a week    Attends Religious Services: Patient declined    Active Member of Clubs or Organizations: No    Attends Engineer, Structural: Not on file    Marital Status: Married  Depression (PHQ2-9): Medium Risk (10/01/2023)   Depression (PHQ2-9)    PHQ-2 Score: 8  Alcohol Screen: Medium Risk (10/01/2023)   Alcohol Screen    Last Alcohol Screening Score (AUDIT): 9  Housing: Low Risk (10/01/2023)   Epic    Unable to Pay for Housing in the Last Year: No    Number of Times Moved in the Last Year: 0    Homeless in the Last Year: No  Utilities: Not At Risk (09/19/2023)   Epic    Threatened with loss of utilities: No  Health Literacy: Adequate Health Literacy (09/19/2023)   B1300 Health Literacy    Frequency of need for help with medical instructions: Never     Family History: The patient's family history includes Cancer in his father; Depression in his mother; Diabetes in his maternal grandmother and mother; Heart attack in his maternal grandfather; Hyperlipidemia in his father and mother; Hypertension in his brother, father, and mother.  ROS:   Please see the history of present illness.     All other systems reviewed and are negative.  EKGs/Labs/Other Studies Reviewed:    The following studies were reviewed today:  EKG  Interpretation Date/Time:  Tuesday May 04 2024 15:25:12 EST Ventricular Rate:  85 PR Interval:  166 QRS Duration:  84 QT Interval:  382 QTC Calculation: 454 R Axis:   12  Text Interpretation: Sinus rhythm with Fusion complexes Nonspecific ST and T wave abnormality Confirmed by Darliss Rogue ([id number]) on 05/04/2024 3:45:12 PM    Recent Labs: 02/05/2024: ALT 45; BUN 20; Creatinine, Ser 1.34; Hemoglobin 16.7; Platelets 327; Potassium 4.0; Sodium 136; TSH 1.980  Recent Lipid Panel    Component Value Date/Time   CHOL 114 02/05/2024 1053   TRIG 110 02/05/2024 1053   HDL 46 02/05/2024 1053   CHOLHDL 2.5 02/05/2024 1053   LDLCALC 48 02/05/2024 1053     Risk Assessment/Calculations:             Physical Exam:    VS:  BP 128/78 (BP Location: Left Arm, Patient Position: Sitting, Cuff Size: Normal)   Pulse 85   Ht 5' 8 (1.727 m)   Wt 191 lb 6.4 oz (86.8 kg)   SpO2 98%   BMI 29.10 kg/m     Wt Readings from Last 3 Encounters:  05/04/24 191 lb 6.4 oz (86.8 kg)  02/09/24 187 lb 2 oz (84.9 kg)  10/20/23 201 lb 12.8 oz (91.5 kg)     GEN:  Well nourished, well developed in no acute distress HEENT: Normal NECK: No JVD; No carotid bruits CARDIAC: RRR, no murmurs, rubs, gallops RESPIRATORY:  Clear to auscultation without rales, wheezing or rhonchi  ABDOMEN: Soft, non-tender, non-distended MUSCULOSKELETAL:  No edema; No deformity  SKIN: Warm and dry NEUROLOGIC:  Alert and oriented x 3 PSYCHIATRIC:  Normal affect   ASSESSMENT:    1. Coronary artery disease involving native coronary artery of native heart, unspecified whether angina present   2. Essential hypertension   3. Mixed hyperlipidemia    PLAN:    In order of problems listed above:  Mild nonobstructive CAD on CCTA 2/25,( 25-49% proximal LAD, minimal RCA, left circumflex).  Echo 2/25 EF 60 to 65%, impaired relaxation.  Continue aspirin,  Lipitor 40 mg daily.   Hypertension, BP controlled.  Continue Toprol -XL 50  mg daily, Zestoretic  20-25 mg daily. Hyperlipidemia, cholesterol controlled.  Lipitor 40 mg daily.  Follow-up in 12 months     Medication Adjustments/Labs and Tests Ordered: Current medicines are reviewed at length with the patient today.  Concerns regarding medicines are outlined above.  Orders Placed This Encounter  Procedures   EKG 12-Lead   No orders of the defined types were placed in this encounter.   Patient Instructions  Medication Instructions:  Your physician recommends that you continue on your current medications as directed. Please refer to the Current Medication list given to you today.   *If you need a refill on your cardiac medications before your next appointment, please call your pharmacy*  Lab Work: No labs ordered today  If you have labs (blood work) drawn today and your tests are completely normal, you will receive your results only by: MyChart Message (if you have MyChart) OR A paper copy in the mail If you have any lab test that is abnormal or we need to change your treatment, we will call you to review the results.  Testing/Procedures: No test ordered today   Follow-Up: At [facility], you and your health needs are our priority.  As part of our continuing mission to provide you with exceptional heart care, our providers are all part of one team.  This team includes your primary Cardiologist (physician) and Advanced Practice Providers or APPs (Physician Assistants and Nurse Practitioners) who all work together to provide you with the care you need, when you need it.  Your next appointment:   1 year(s)  Provider:   You may see Redell Cave, MD or one of the following Advanced Practice Providers on your designated Care Team:   Lonni Meager, NP Lesley Maffucci, PA-C Bernardino Bring, PA-C Cadence [place], PA-C Tylene Lunch, NP Barnie Hila, NP    We recommend signing up for the patient portal called MyChart.  Sign up information is  provided on this After Visit Summary.  MyChart is used to connect with patients for Virtual Visits (Telemedicine).  Patients are able to view lab/test results, encounter notes, upcoming appointments, etc.  Non-urgent messages can be sent to your provider as well.   To learn more about what you can do with MyChart, go to [email or web address].               Signed, Redell Cave, MD  05/04/2024 4:09 PM    [facility] "

## 2024-05-04 NOTE — Patient Instructions (Signed)

## 2024-08-09 ENCOUNTER — Ambulatory Visit: Admitting: Family Medicine

## 2024-10-01 ENCOUNTER — Ambulatory Visit

## 2024-10-06 ENCOUNTER — Ambulatory Visit
